# Patient Record
Sex: Male | Born: 1945 | Race: White | Hispanic: No | Marital: Married | Smoking: Former smoker
Health system: Southern US, Community
[De-identification: ages and names within clinical notes are randomized; demographics above are authoritative.]

## PROBLEM LIST (undated history)

## (undated) DIAGNOSIS — F32A Depression, unspecified: Secondary | ICD-10-CM

## (undated) DIAGNOSIS — K219 Gastro-esophageal reflux disease without esophagitis: Secondary | ICD-10-CM

## (undated) DIAGNOSIS — E118 Type 2 diabetes mellitus with unspecified complications: Secondary | ICD-10-CM

## (undated) DIAGNOSIS — I214 Non-ST elevation (NSTEMI) myocardial infarction: Secondary | ICD-10-CM

## (undated) DIAGNOSIS — C801 Malignant (primary) neoplasm, unspecified: Secondary | ICD-10-CM

## (undated) DIAGNOSIS — F329 Major depressive disorder, single episode, unspecified: Secondary | ICD-10-CM

## (undated) DIAGNOSIS — E785 Hyperlipidemia, unspecified: Secondary | ICD-10-CM

## (undated) DIAGNOSIS — L02415 Cutaneous abscess of right lower limb: Secondary | ICD-10-CM

## (undated) DIAGNOSIS — I2510 Atherosclerotic heart disease of native coronary artery without angina pectoris: Secondary | ICD-10-CM

## (undated) DIAGNOSIS — M1990 Unspecified osteoarthritis, unspecified site: Secondary | ICD-10-CM

## (undated) DIAGNOSIS — E119 Type 2 diabetes mellitus without complications: Secondary | ICD-10-CM

## (undated) DIAGNOSIS — K4090 Unilateral inguinal hernia, without obstruction or gangrene, not specified as recurrent: Secondary | ICD-10-CM

## (undated) DIAGNOSIS — I9789 Other postprocedural complications and disorders of the circulatory system, not elsewhere classified: Secondary | ICD-10-CM

## (undated) DIAGNOSIS — E039 Hypothyroidism, unspecified: Secondary | ICD-10-CM

## (undated) DIAGNOSIS — M542 Cervicalgia: Secondary | ICD-10-CM

## (undated) DIAGNOSIS — Z87442 Personal history of urinary calculi: Secondary | ICD-10-CM

## (undated) DIAGNOSIS — Z872 Personal history of diseases of the skin and subcutaneous tissue: Secondary | ICD-10-CM

## (undated) DIAGNOSIS — Z9889 Other specified postprocedural states: Secondary | ICD-10-CM

## (undated) DIAGNOSIS — K8010 Calculus of gallbladder with chronic cholecystitis without obstruction: Secondary | ICD-10-CM

## (undated) DIAGNOSIS — E079 Disorder of thyroid, unspecified: Secondary | ICD-10-CM

## (undated) DIAGNOSIS — I4891 Unspecified atrial fibrillation: Secondary | ICD-10-CM

## (undated) DIAGNOSIS — J45909 Unspecified asthma, uncomplicated: Secondary | ICD-10-CM

## (undated) HISTORY — DX: Hyperlipidemia, unspecified: E78.5

## (undated) HISTORY — DX: Calculus of gallbladder with chronic cholecystitis without obstruction: K80.10

## (undated) HISTORY — DX: Unspecified osteoarthritis, unspecified site: M19.90

## (undated) HISTORY — DX: Cutaneous abscess of right lower limb: L02.415

## (undated) HISTORY — DX: Cervicalgia: M54.2

## (undated) HISTORY — DX: Malignant (primary) neoplasm, unspecified: C80.1

## (undated) HISTORY — DX: Unilateral inguinal hernia, without obstruction or gangrene, not specified as recurrent: K40.90

## (undated) HISTORY — PX: EYE SURGERY: SHX253

## (undated) HISTORY — DX: Other specified postprocedural states: Z98.890

## (undated) HISTORY — PX: SINUS SURGERY WITH INSTATRAK: SHX5215

## (undated) HISTORY — PX: CHOLECYSTECTOMY: SHX55

## (undated) HISTORY — DX: Atherosclerotic heart disease of native coronary artery without angina pectoris: I25.10

## (undated) HISTORY — DX: Disorder of thyroid, unspecified: E07.9

## (undated) HISTORY — PX: HERNIA REPAIR: SHX51

## (undated) HISTORY — DX: Gastro-esophageal reflux disease without esophagitis: K21.9

## (undated) HISTORY — DX: Personal history of diseases of the skin and subcutaneous tissue: Z87.2

## (undated) HISTORY — DX: Non-ST elevation (NSTEMI) myocardial infarction: I21.4

## (undated) HISTORY — DX: Unspecified atrial fibrillation: I48.91

## (undated) HISTORY — DX: Type 2 diabetes mellitus without complications: E11.9

## (undated) HISTORY — DX: Other postprocedural complications and disorders of the circulatory system, not elsewhere classified: I97.89

## (undated) HISTORY — DX: Unspecified asthma, uncomplicated: J45.909

## (undated) HISTORY — DX: Type 2 diabetes mellitus with unspecified complications: E11.8

---

## 2001-12-08 ENCOUNTER — Encounter: Payer: Self-pay | Admitting: Internal Medicine

## 2001-12-08 ENCOUNTER — Ambulatory Visit (HOSPITAL_COMMUNITY): Admission: RE | Admit: 2001-12-08 | Discharge: 2001-12-08 | Payer: Self-pay | Admitting: Internal Medicine

## 2004-03-20 ENCOUNTER — Encounter: Admission: RE | Admit: 2004-03-20 | Discharge: 2004-03-20 | Payer: Self-pay | Admitting: Otolaryngology

## 2004-10-19 ENCOUNTER — Ambulatory Visit: Admission: RE | Admit: 2004-10-19 | Discharge: 2005-01-07 | Payer: Self-pay | Admitting: Radiation Oncology

## 2004-11-03 ENCOUNTER — Encounter: Admission: RE | Admit: 2004-11-03 | Discharge: 2004-11-03 | Payer: Self-pay | Admitting: Urology

## 2004-11-24 HISTORY — PX: PROSTATE SURGERY: SHX751

## 2004-12-09 ENCOUNTER — Ambulatory Visit (HOSPITAL_COMMUNITY): Admission: RE | Admit: 2004-12-09 | Discharge: 2004-12-09 | Payer: Self-pay | Admitting: Urology

## 2004-12-09 ENCOUNTER — Ambulatory Visit (HOSPITAL_BASED_OUTPATIENT_CLINIC_OR_DEPARTMENT_OTHER): Admission: RE | Admit: 2004-12-09 | Discharge: 2004-12-09 | Payer: Self-pay | Admitting: Urology

## 2005-09-24 HISTORY — PX: BACK SURGERY: SHX140

## 2005-10-12 ENCOUNTER — Encounter: Payer: Self-pay | Admitting: Neurosurgery

## 2005-10-13 ENCOUNTER — Inpatient Hospital Stay (HOSPITAL_COMMUNITY): Admission: RE | Admit: 2005-10-13 | Discharge: 2005-10-15 | Payer: Self-pay | Admitting: Neurosurgery

## 2006-03-25 ENCOUNTER — Encounter: Admission: RE | Admit: 2006-03-25 | Discharge: 2006-03-25 | Payer: Self-pay | Admitting: Neurosurgery

## 2006-09-21 ENCOUNTER — Emergency Department (HOSPITAL_COMMUNITY): Admission: EM | Admit: 2006-09-21 | Discharge: 2006-09-21 | Payer: Self-pay | Admitting: *Deleted

## 2008-03-12 ENCOUNTER — Ambulatory Visit: Payer: Self-pay | Admitting: Internal Medicine

## 2008-03-27 ENCOUNTER — Ambulatory Visit: Payer: Self-pay | Admitting: Internal Medicine

## 2008-03-27 ENCOUNTER — Encounter: Payer: Self-pay | Admitting: Internal Medicine

## 2008-03-28 ENCOUNTER — Encounter: Payer: Self-pay | Admitting: Internal Medicine

## 2009-03-12 DIAGNOSIS — Z9889 Other specified postprocedural states: Secondary | ICD-10-CM

## 2009-03-12 HISTORY — DX: Other specified postprocedural states: Z98.890

## 2010-09-11 NOTE — H&P (Signed)
NAME:  Dan Lester, Dan Lester NO.:  [id number]   MEDICAL RECORD NO.:  [id number]          PATIENT TYPE:  INP   LOCATION:  3020                         FACILITY:  [facility]   PHYSICIAN:  Hewitt Shorts, M.D.DATE OF BIRTH:  Feb 19, 1946   DATE OF ADMISSION:  10/13/2005  DATE OF DISCHARGE:                                HISTORY & PHYSICAL   HISTORY OF PRESENT ILLNESS:  The patient is a 65 year old, right handed,  white male who is evaluated for low back and bilateral lumbar radicular  pain.  He has been having low back pain and stiffness for about 2.5 years  and has been undergoing chiropractic treatment.  For the past 2 years he has  noted a pulling sensation in the right lower extremity and more recently  paresthesias to the right lower extremity which have been gradually  worsening and now he is developing paresthesias in the sole of his left  foot.  The symptoms worsened about 2 months ago with the development of pain in the  right buttocks extending down the right posterior thigh and calf with  essential weakness in the right lower extremity.  The patient was evaluated  with x-rays and MRI scan.  The x-rays show a grade 2 spondylolisthesis at L5-  S1 with marked disk space narrowing and vacuum disk phenomenon with  bilateral L5 partial interarticularis defects.  MRI scan shows moderate  multifactorial spinal stenosis at L4-L5 of the spinal canal and a grade 2  spondylolisthesis at L5-S1 secondary to bilateral L5 partial  interarticularis defect with significant disk protrusion worse to the right  with significant bilateral pseudoarthrosis.  There is severe right L5-S1  neuroforaminal stenosis and moderate L5-S1 neuroforaminal stenosis.  The  patient is being admitted now for inferior L4 laminectomy, and L5 Gill  procedure, and L5-S1 posterior lumbar interbody fusion, and L4-S1  posterolateral arthrodesis with posterior instrumentation and right iliac  crest bone  graft.   PAST MEDICAL HISTORY:  1.  History of prostate cancer, status post implantation of radiation seeds.  2.  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  3.  Status post removal of a benign tumor at age 28 from behind his right      eye with residual diplopia.   He does not describe any history of hypertension, myocardial infarctions,  stroke, diabetes, peptic ulcer disease, or lung disease.   PREVIOUS SURGERY:  1.  Removal of tumor from behind his right eye in 1959.  2.  Sinus polyp removal in November 2005.  3.  Insertion of radiation seeds in the prostate in August 2006.   He denies allergies to medications.   CURRENT MEDICATIONS:  1.  Nexium 40 mg every day.  2.  Mobic 15 mg every day.  3.  Wellbutrin 150 mg every day.  4.  Synthroid 0.2 mg every day.  5.  Darvocet p.r.n. pain.  6.  Multivitamin.   FAMILY HISTORY:  His mother passed on at age 66.  She had hypertension.  His  father is in fair health at  age 40.  He had a stroke.  A son has diabetes.   SOCIAL HISTORY:  The patient is married.  He does sales work.  He does not  smoke.  He does drink alcoholic beverages socially and he denies a history  of substance abuse.   REVIEW OF SYSTEMS:  Notable for that described in his history of present  illness, past medical history, but is otherwise unremarkable.   PHYSICAL EXAMINATION:  GENERAL: The patient is a well-developed, well-  nourished, white male in no acute distress.  VITAL SIGNS:  Temperature 97.2, pulse 85, blood pressure 151/94, respiratory  rate 16, height 5 foot 11 inches, weight 180 pounds.  LUNGS:  Clear to auscultation.  He has symmetrical respiratory excursion.  HEART:  Regular rate and rhythm.  S1 S2.  There is no murmur.  ABDOMEN:  Soft, nondistended.  Bowel sounds are present.  EXTREMITIES:  Show no clubbing, cyanosis, or edema.  MUSCULOSKELETAL;  Shows limitation of flexion and extension due to pain.   He  is able to flex to about 45 degrees but has pain at that point.  He is  unable to extend much beyond an upright position because of pain.  Straight  leg raising is positive on the right, negative on the left.  He has mild  discomfort diffusely to palpation in the lumbar region.  NEUROLOGIC:  Shows 5/5 strength in the distal lower extremities including  the dorsiflexors, extensor hallucis longus, and plantar flexor bilaterally.  Sensation is intact to pinprick throughout the distal lower extremities.  Reflexes are 1-2 at the quadriceps and gastrocnemious.  Toes are downgoing  bilaterally.  He has a normal gait and stance.   IMPRESSION:  1.  Patient with low back pain and bilateral lumbar radiculopathy, right      worse than left.  2.  He has moderate stenosis at L4-L5, bilateral L5 partial interarticularis      defect, with a grade 2 spondylolisthesis at L5-S1, with severe right L5-      S1 neuroforaminal stenosis, and moderate left L5-S1 neuroforaminal      stenosis.   PLAN:  The patient will be admitted for inferior L4 laminectomy and L5 Gill  procedure and L5-S1 posterior lumbar interbody fusion with interbody  implants and bone graft and an L4-S1 posterolateral arthrodesis with  posterior instrumentation and right iliac crest bone graft.   We discussed alternatives to surgery including symptomatic treatment as well  as a series of epidural steroid injections.  We have also discussed the  nature of the surgical procedures including typical length of surgery,  hospital stay, and overall recuperation .  His limitations postoperatively.  The need for postoperative immobilization, a lumbar corset, and risks  including the risks of infection, bleeding, possible need for transfusion,  the risk of nerve dysfunction with pain, weakness, numbness, or  paresthesias, the risk of dural tear and possible need for further surgery, the risk of failure of the arthrodesis and anesthetic risk of  myocardial  infarction, stroke, pneumonia and death.  Understanding all of that, he  wishes to proceed with surgery and is admitted for such.      Hewitt Shorts, M.D.  Electronically Signed     RWN/MEDQ  D:  10/13/2005  T:  10/13/2005  Job:  [id number]

## 2010-09-11 NOTE — Op Note (Signed)
NAME:  Dan Lester, Dan Lester NO.:  [id number]   MEDICAL RECORD NO.:  [id number]          PATIENT TYPE:  AMB   LOCATION:  [facility]                         FACILITY:  [facility]   PHYSICIAN:  Valetta Fuller, M.D.  DATE OF BIRTH:  1945/05/27   DATE OF PROCEDURE:  12/09/2004  DATE OF DISCHARGE:                                 OPERATIVE REPORT   PREOPERATIVE DIAGNOSES:  Clinical stage T1C adenocarcinoma of the prostate.   POSTOPERATIVE DIAGNOSES:  Clinical stage T1C adenocarcinoma of the prostate.   PROCEDURE:  I125 Nucletron seed implantation and flexible cystoscopy.   SURGEON:  Valetta Fuller, M.D.   ASSISTANT:  Maryln Gottron, M.D.   ANESTHESIA:  General.   INDICATIONS FOR PROCEDURE:  Mr. Wentzell is a 65 year old male. He was  recently diagnosed with clinical stage T1C adenocarcinoma of the prostate.  The patient was originally seen by Dr. Patsi Sears because of a rise in his  PSA from 1.4 to 3.8. 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 Dr. Patsi Sears recommended repeat biopsy and he was out with  illness and therefore I did that recently. The patient's prostate size is  approximately 28 cubic centimeters. On his biopsy, the patient did have  adenocarcinoma of the prostate. This involved only the left side and only  about 5% of the biopsy material was involved with a Gleason 3+3=6 cancer.  The patient really had no voiding complaints. A PSA around the time of his  diagnosis was only 1.1. We felt therefore he had very low volume well  differentiated prostate cancer. We had suggested the possibility of the  observation and felt that radical retropubic prostatectomy would be over  aggressive treatment despite his young age because of what may be clinically  insignificant prostate cancer. The patient however was nervous about simply  observing this and we felt therefore a reasonable alternative may be seed  implantation for this patient. The  patient subsequently saw Dr. Dayton Scrape in  consultation and requested that seed implantation be done. We felt that  given his situation, he was an excellent candidate for that and he appeared  to understand the advantages and disadvantages of this approach. Again full  and informed consent was obtained.   TECHNIQUE AND FINDINGS:  The patient was brought to the operating room where  he had successful induction of general anesthesia. He was placed in the  moderate lithotomy position, prepped and draped in the usual manner. The  prostate ultrasound was then brought in and anchored to the stand. We spent  the initial 30-45 minutes making sure that prostate positioning was  excellent and that we had a good image. Once that was accomplished, real-  time contouring of the prostate was performed as well as of the urethra and  rectum. Anchor needles were also placed in the prostate and a Foley catheter  had been inserted with some contrast in the balloon. Once all the contouring  was done, Dr. Dayton Scrape and the oncology department established a real-time  plan for the patient. The total number of seeds was 50 with an activity  of  0.432 millicuries per seed. Once the plan had been established, the template  was attached to the stand and onto the perineum. Each needle passage was  done with sagittal ultrasound real-time guidance. Once needle positioning  was confirmed, the needle was attached to the Nucletron automatic dispensing  device and the seeds were implanted. This was done without any difficulty. A  total of 18 needles was used with again 50 seeds. The distribution appeared  excellent and we had no difficulties. At the completion of the procedure,  the Foley catheter was removed and cystoscopy was performed. This showed no  evidence of seeds within the prostatic urethra or bladder. A new Foley  catheter was placed and the patient was brought to the recovery room in  stable condition.            ______________________________  Valetta Fuller, M.D.     DSG/MEDQ  D:  12/10/2004  T:  12/10/2004  Job:  [id number]

## 2010-09-11 NOTE — Op Note (Signed)
NAME:  Dan Lester, Dan Lester NO.:  [id number]   MEDICAL RECORD NO.:  [id number]          PATIENT TYPE:  INP   LOCATION:  3020                         FACILITY:  [facility]   PHYSICIAN:  Hewitt Shorts, M.D.DATE OF BIRTH:  [birth year]   DATE OF PROCEDURE:  10/13/2005  DATE OF DISCHARGE:                                 OPERATIVE REPORT   PREOPERATIVE DIAGNOSIS:  Bilateral L5 pars intra-articularis defect, grade 2  spondylolisthesis of L5 and S1, bilateral L5-S1 neuroforaminal stenosis, L4-  5 canal stenosis, lumbar spondylosis and lumbar degenerative disk disease.   POSTOPERATIVE DIAGNOSIS:  Grade 2 spondylolisthesis at L5 and S1 secondary  to facet arthropathy, bilateral L5-S1 neuroforaminal stenosis, lumbar canal  stenosis at L4-5, and lumbar spondylosis and lumbar degenerative disk  disease.   PROCEDURE:  Inferior L4 laminectomy and complete L5 laminectomy and superior  S1 laminectomy with microdissection, L5-S1 posterior lumbar interbody fusion  with AVS Peak interbody implants and right iliac crest bone graft and VITOSS  with bone marrow aspirate and L4 to S1 posterolateral arthrodesis with 90D  posterior instrumentation and right iliac crest bone graft and VITOSS with  bone marrow aspirate.   SURGEON:  Hewitt Shorts, M.D.   ASSISTANT:  Danae Orleans. Venetia Maxon, M.D.   ANESTHESIA:  General endotracheal.   INDICATIONS FOR PROCEDURE:  The patient is a 65 year old man who presented  with low back and bilateral lumbar radicular pain right worse than left.  MRI revealed advanced degenerative changes in the lower lumbar spine and a  decision was made to proceed with decompression and stabilization.   DESCRIPTION OF PROCEDURE:  The patient was brought to the operating room and  placed under general endotracheal anesthesia.  The patient was turned to a  prone position and the lumbar region was prepped with Betadine soap and  solution and draped in a sterile fashion.  The  midline was infiltrated with  local anesthetic with epinephrine and an x-ray was taken to localize the L4-  5 and L5-S1 levels and then a midline incision was made and carried down  through the subcutaneous tissue.  Bipolar cautery and electrocautery were  used to maintain hemostasis.  Dissection was carried down to the lumbar  fascia which was incised bilaterally and the paraspinal muscles were  dissected from the spinous process of the lamina in a subperiosteal fashion.  Another x-ray was taken and the L4-5 and L5-S1 intralaminar spaces were  identified.  There is marked spondylosis noted with significant facet  hypertrophy and arthropathy bilaterally at both L4-5 and L5-S1.  Laminectomy  was initiated using the X-Max drill and double action rongeurs and Kerrison  punches.  The dura was found to be adherent to the lamina and was carefully  dissected from it.  However, a small rent in the dura did occur on the right  side beneath the lamina of L5.  The microscope was draped and brought into  the field to provide instant magnification, illumination, and visualization.  The remainder of the decompression was performed using microdissection and  microsurgical technique.  The small rent in the dura was repaired with  a  running 6-0 Prolene suture and a good water tight closure was achieved.  The  decompression was extended laterally removing the hypertrophic facet  overgrowth bilaterally at the L4-5 and L5-S1.  The ligamentum flavum was  markedly thickened at L4-5 and was carefully removed decompressing the  thecal sac.  We identified the exiting L5 nerve roots bilaterally and  dissection was carried out into the L5-S1 neuroforamen bilaterally and there  was significant spondylitic overgrowth and pseudoarthrosis from the  arthropathic facet joints compressing the nerve roots and this was carefully  removed decompressing the nerve roots.  We then coagulated the epidural  veins and identified the  annulus at the L5-S1 disk.  There is significant  spondylitic degeneration and the annulus was incised and we entered into the  disk space.  The posterior superior aspect of S1 was removed to provide  better exposure into the disk space.  Then we entered the disk space and  using a variety of microcurettes and pituitary rongeurs, a thorough  diskectomy was performed.  We then used a variety of paddle curets to  prepare the endplates at L5 and S1 for an interbody arthrodesis.  Once the  endplates were cleaned of cartilaginous tissue down to the bony tissue, we  then measured the height of the intervertebral disk space and selected 9 mm  x 20 mm interbody implants with 8 degrees of lordosis due to the relative  fishmouth appearance of the L5-S1 disk space.  We then brought the fascial  layers together and identified the right superior iliac crest.  The  overlying skin and subcutaneous tissue were infiltrated with local  anesthetic with epinephrine and then an incision was made in the skin  overlying the right iliac crest and dissection was carried down to the  subcutaneous tissue to the right iliac crest.  The fascial insertion on the  superior aspect was incised and the muscle dissected from the posterior  aspect of the iliac crest.  A Taylor retractor was placed and then using a  variety of osteotomes and curets, strips of corticocancellous bone and then  subsequently strips of cancellous bone were harvested and set aside for  later use in the arthrodesis.  Once the harvesting of bone graft was  completed, we soaked a 10 mL strip of VITOSS in the bone marrow that oozed  from the edges of the harvesting site and then we proceeded with closure.  The deep fascia was closed with interrupted undyed 1 Vicryl sutures.  Scarpa's fascia was closed with interrupted undyed 1 Vicryl sutures.  The subcutaneous tissue and subcuticular layer were closed with interrupted  inverted 2-0 Vicryl sutures and  the skin edges were closed with surgical  staples.  A count was done at that time and was correct.  We then placed the  self-retaining retractor back in the lumbar incision.  We packed the Peak  interbody implants with the cancellous excess bone graft, and then carefully  retracting the thecal sac first on the right side and then subsequently on  the left side, the interbody implants were placed and countersunk.  Prior to  placing the second implant, that is the one on the left side, we packed the  space between the two Peak implants with VITOSS soaked with bone marrow  aspirate.  We then proceeded with the posterolateral arthrodesis.  We had  previously exposed the transverse processes of L4 and L5 and the ala of S1.  These were decorticated using the  X-MAX drill.  The C-arm fluoroscope was  then brought into the field and using C-arm fluoroscopy, we identified the  pedicle entry sites for L4, L5, and S1 bilaterally.  Using a pedicle probe,  each of the pedicles was probed, examined with a ball probe, no cutouts were  found.  We then tapped each of the pedicles with a 5.25 mm tap and we placed  5.75 x 40 mm screws at L4, 5.75 x 35 mm screws at L5, and we placed a 5.75 x  30 mm screw on the left side at S1, and a 6.75 x 30 mm screw on the right  side at S1 due to some concern regarding the initial threading.  Good  purchase was achieved with that right S1 screw.  Once all six screws were in  place, an AP view confirmed good trajectories.  We then selected a 50 mm  lordosis rod.  Prior to placing the rods, we packed the lateral gutters over  the transverse processes and ala with a combination of cancellous autograft,  VITOSS with bone marrow aspirate, and then strips of corticocancellous  autograft.  Once the bone graft was packed in the lateral gutters, we placed  the lordosed 50 mm rods bilaterally.  They were secured with locking caps  and final tightening was done of all six locking caps.   The wound was  irrigated numerous times during the procedure with bacitracin solution.  Hemostasis was established and confirmed.  The dual repair was intact and  showed no signs of CSF leakage.  It was felt that a good water tight closure  had been achieved.  The wound was then closed in multiple layers.  The  paraspinal muscles were approximated with interrupted undyed 1 Vicryl  sutures, the deep fascia was closed with interrupted undyed 1 Vicryl  sutures, Scarpa's fascia was closed with interrupted undyed inverted 1  Vicryl sutures, and the subcutaneous and subcuticular layer were closed with  interrupted inverted 2-0 undyed Vicryl sutures, and skin edges were closed  with surgical staples.  The wound was dressed with Adaptic, sterile gauze,  and Hypafix.  The procedure was tolerated well.  The estimated blood loss  was 600 mL.  We did give the patient back 300 mL of Cellsaver blood.  Needle, sponge, and instrument  counts correct.  Following surgery, the patient was turned back to supine  position to be reversed from anesthetic, extubated, and transferred to the  recovery room for further care where he was noted to be moving all four  extremities to command.      Hewitt Shorts, M.D.  Electronically Signed     RWN/MEDQ  D:  10/13/2005  T:  10/14/2005  Job:  [id number]

## 2011-12-08 ENCOUNTER — Encounter (INDEPENDENT_AMBULATORY_CARE_PROVIDER_SITE_OTHER): Payer: Self-pay | Admitting: Surgery

## 2011-12-17 ENCOUNTER — Encounter (INDEPENDENT_AMBULATORY_CARE_PROVIDER_SITE_OTHER): Payer: Self-pay | Admitting: Surgery

## 2011-12-17 ENCOUNTER — Ambulatory Visit (INDEPENDENT_AMBULATORY_CARE_PROVIDER_SITE_OTHER): Payer: BC Managed Care – PPO | Admitting: Surgery

## 2011-12-17 VITALS — BP 138/84 | HR 72 | Temp 97.4°F | Resp 14 | Ht 74.0 in | Wt 184.0 lb

## 2011-12-17 DIAGNOSIS — K4090 Unilateral inguinal hernia, without obstruction or gangrene, not specified as recurrent: Secondary | ICD-10-CM

## 2011-12-17 HISTORY — DX: Unilateral inguinal hernia, without obstruction or gangrene, not specified as recurrent: K40.90

## 2011-12-17 NOTE — Progress Notes (Signed)
Patient ID: Dan Lester, male   DOB: 08/14/1945, 66 y.o.   MRN: [id number]  Chief Complaint  Patient presents with  . Inguinal Hernia    HPI Dan Lester is a 66 y.o. male.  Referred by Dr. Jacky Kindle for evaluation of left inguinal hernia HPI    Two month history of an enlarging bulge in his left groin.  It has become larger and more uncomfortable, but remains reducible.  He was evaluated by Dr. Jacky Kindle who felt that he had a left inguinal hernia.  He denies any obstructive symptoms.  Past Medical History  Diagnosis Date  . GERD (gastroesophageal reflux disease)   . Hyperlipidemia   . Neck pain   . H/O colonoscopy 03/12/2009  . Asthma   . Inguinal hernia     Past Surgical History  Procedure Date  . Eye surgery   . Sinus surgery with instatrak   . Prostate surgery 11/2004    seed implant  . Back surgery 09/2005    History reviewed. No pertinent family history.  Social History History  Substance Use Topics  . Smoking status: Passive Smoker    Types: Cigars  . Smokeless tobacco: Never Used  . Alcohol Use: No    No Known Allergies  Current Outpatient Prescriptions  Medication Sig Dispense Refill  . buPROPion (WELLBUTRIN SR) 150 MG 12 hr tablet Take 150 mg by mouth daily.      Marland Kitchen esomeprazole (NEXIUM) 40 MG capsule Take 40 mg by mouth daily before breakfast.      . levothyroxine (SYNTHROID) 200 MCG tablet Take 200 mcg by mouth daily.      . Fluticasone-Salmeterol (ADVAIR) 250-50 MCG/DOSE AEPB Inhale 1 puff into the lungs every 12 (twelve) hours.      . methylPREDNISolone acetate (DEPO-MEDROL) 40 MG/ML injection once.      Marland Kitchen PREDNISONE, PAK, PO Take by mouth.        Review of Systems Review of Systems  Constitutional: Negative for fever, chills and unexpected weight change.  HENT: Negative for hearing loss, congestion, sore throat, trouble swallowing and voice change.   Eyes: Negative for visual disturbance.  Respiratory: Negative for cough and  wheezing.   Cardiovascular: Negative for chest pain, palpitations and leg swelling.  Gastrointestinal: Negative for nausea, vomiting, abdominal pain, diarrhea, constipation, blood in stool, abdominal distention, anal bleeding and rectal pain.  Genitourinary: Negative for hematuria and difficulty urinating.  Musculoskeletal: Positive for back pain and arthralgias.  Skin: Negative for rash and wound.  Neurological: Negative for seizures, syncope, weakness and headaches.  Hematological: Negative for adenopathy. Does not bruise/bleed easily.  Psychiatric/Behavioral: Negative for confusion.    Blood pressure 138/84, pulse 72, temperature 97.4 F (36.3 C), temperature source Temporal, resp. rate 14, height 6\' 2"  (1.88 m), weight 184 lb (83.462 kg).  Physical Exam Physical Exam WDWN in NAD HEENT:  EOMI, sclera anicteric Neck:  No masses, no thyromegaly Lungs:  CTA bilaterally; normal respiratory effort CV:  Regular rate and rhythm; no murmurs Abd:  +bowel sounds, soft, non-tender, no masses GU:  Bilateral descended testicles; visible left inguinal bulge - reducible; no sign of right inguinal hernia Ext:  Well-perfused; no edema Skin:  Warm, dry; no sign of jaundice  Data Reviewed none  Assessment    Left inguinal hernia - reducible     Plan    Left inguinal hernia repair with mesh.  The surgical procedure has been discussed with the patient.  Potential risks, benefits, alternative treatments, and  expected outcomes have been explained.  All of the patient's questions at this time have been answered.  The likelihood of reaching the patient's treatment goal is good.  The patient understand the proposed surgical procedure and wishes to proceed.        Manveer Gomes K. 12/17/2011, 10:36 AM

## 2012-02-04 DIAGNOSIS — K4090 Unilateral inguinal hernia, without obstruction or gangrene, not specified as recurrent: Secondary | ICD-10-CM

## 2012-02-23 ENCOUNTER — Ambulatory Visit (INDEPENDENT_AMBULATORY_CARE_PROVIDER_SITE_OTHER): Payer: BC Managed Care – PPO | Admitting: Surgery

## 2012-02-23 ENCOUNTER — Encounter (INDEPENDENT_AMBULATORY_CARE_PROVIDER_SITE_OTHER): Payer: Self-pay | Admitting: Surgery

## 2012-02-23 VITALS — BP 122/88 | HR 81 | Temp 98.2°F | Resp 14 | Ht 74.0 in | Wt 184.4 lb

## 2012-02-23 DIAGNOSIS — K4090 Unilateral inguinal hernia, without obstruction or gangrene, not specified as recurrent: Secondary | ICD-10-CM

## 2012-02-23 NOTE — Progress Notes (Signed)
Followup after a left inguinal hernia repair with mesh on 02/04/12. He had a indirect hernia. He has minimal swelling. He still has some sensitivity near the pubic tubercle radiating down towards his testicle. On examination his incision is well-healed no sign of infection. Minimal swelling. No sign of recurrent hernia. He is tender in his pubic tubercle. Explained that this should resolve as the inflammation resolves. I recommended using ibuprofen. He may follow up with [place] as needed. He should slowly increase his level of activity over next 2-3 weeks.  Wilmon Arms. Corliss Skains, MD, [facility]  02/23/2012 9:49 AM

## 2013-02-08 ENCOUNTER — Encounter: Payer: Self-pay | Admitting: Internal Medicine

## 2013-09-22 ENCOUNTER — Encounter: Payer: Self-pay | Admitting: Internal Medicine

## 2013-09-24 ENCOUNTER — Ambulatory Visit
Admission: RE | Admit: 2013-09-24 | Discharge: 2013-09-24 | Disposition: A | Discharge status: Home / Self Care | Payer: BC Managed Care – PPO | Source: Ambulatory Visit | Attending: Internal Medicine | Admitting: Internal Medicine

## 2013-09-24 ENCOUNTER — Other Ambulatory Visit: Payer: Self-pay | Admitting: Internal Medicine

## 2013-09-24 DIAGNOSIS — R1013 Epigastric pain: Secondary | ICD-10-CM

## 2013-09-24 DIAGNOSIS — R197 Diarrhea, unspecified: Secondary | ICD-10-CM

## 2013-09-24 DIAGNOSIS — R112 Nausea with vomiting, unspecified: Secondary | ICD-10-CM

## 2013-09-25 ENCOUNTER — Other Ambulatory Visit (HOSPITAL_COMMUNITY): Payer: Self-pay | Admitting: Internal Medicine

## 2013-09-25 DIAGNOSIS — K828 Other specified diseases of gallbladder: Secondary | ICD-10-CM

## 2013-09-25 DIAGNOSIS — R109 Unspecified abdominal pain: Secondary | ICD-10-CM

## 2013-10-03 ENCOUNTER — Ambulatory Visit (HOSPITAL_COMMUNITY)
Admission: RE | Admit: 2013-10-03 | Discharge: 2013-10-03 | Disposition: A | Discharge status: Home / Self Care | Payer: BC Managed Care – PPO | Source: Ambulatory Visit | Attending: Internal Medicine | Admitting: Internal Medicine

## 2013-10-03 DIAGNOSIS — K8020 Calculus of gallbladder without cholecystitis without obstruction: Secondary | ICD-10-CM | POA: Insufficient documentation

## 2013-10-03 DIAGNOSIS — K828 Other specified diseases of gallbladder: Secondary | ICD-10-CM

## 2013-10-03 DIAGNOSIS — R109 Unspecified abdominal pain: Secondary | ICD-10-CM | POA: Insufficient documentation

## 2013-10-03 MED ORDER — TECHNETIUM TC 99M MEBROFENIN IV KIT
5.1000 | PACK | Freq: Once | INTRAVENOUS | Status: AC | PRN
  Administered 2013-10-03: 5 via INTRAVENOUS

## 2013-10-04 ENCOUNTER — Encounter: Payer: Self-pay | Admitting: Internal Medicine

## 2013-10-12 ENCOUNTER — Other Ambulatory Visit: Payer: Self-pay | Admitting: Internal Medicine

## 2013-10-12 DIAGNOSIS — R109 Unspecified abdominal pain: Secondary | ICD-10-CM

## 2013-10-15 ENCOUNTER — Ambulatory Visit
Admission: RE | Admit: 2013-10-15 | Discharge: 2013-10-15 | Disposition: A | Discharge status: Home / Self Care | Payer: BC Managed Care – PPO | Source: Ambulatory Visit | Attending: Internal Medicine | Admitting: Internal Medicine

## 2013-10-15 DIAGNOSIS — R109 Unspecified abdominal pain: Secondary | ICD-10-CM

## 2013-10-15 MED ORDER — IOHEXOL 300 MG/ML  SOLN
100.0000 mL | Freq: Once | INTRAMUSCULAR | Status: AC | PRN
  Administered 2013-10-15: 100 mL via INTRAVENOUS

## 2013-10-16 ENCOUNTER — Encounter (INDEPENDENT_AMBULATORY_CARE_PROVIDER_SITE_OTHER): Payer: Self-pay | Admitting: Surgery

## 2013-10-19 ENCOUNTER — Encounter (INDEPENDENT_AMBULATORY_CARE_PROVIDER_SITE_OTHER): Payer: Self-pay | Admitting: Surgery

## 2013-10-19 ENCOUNTER — Ambulatory Visit (INDEPENDENT_AMBULATORY_CARE_PROVIDER_SITE_OTHER): Payer: BC Managed Care – PPO | Admitting: Surgery

## 2013-10-19 VITALS — BP 152/98 | HR 68 | Temp 97.4°F | Resp 16 | Ht 74.0 in | Wt 182.0 lb

## 2013-10-19 DIAGNOSIS — K8010 Calculus of gallbladder with chronic cholecystitis without obstruction: Secondary | ICD-10-CM

## 2013-10-19 HISTORY — DX: Calculus of gallbladder with chronic cholecystitis without obstruction: K80.10

## 2013-10-19 NOTE — Progress Notes (Signed)
Patient ID: Dan Lester, male   DOB: 28-Jan-1946, 68 y.o.   MRN: [id number]  Chief Complaint  Patient presents with  . Cholelithiasis    new pt    HPI Dan Lester is a 68 y.o. male.  Referred by Dr. Burnard Bunting for evaluation of gallbladder disease  HPI This is a healthy 68 year old male who presents with a one-month history of intermittent abdominal pain, abdominal bloating, nausea, occasional vomiting, diarrhea, that seems to occur sporadically in the morning. There is slight correlation with eating breakfast. The pain has recently begun radiating around his right side. He has had an extensive radiologic workup that shows gallstones but no sign of choledocholithiasis. No sign of cholecystitis. He presents now for surgical evaluation for cholecystectomy.  Past Medical History  Diagnosis Date  . GERD (gastroesophageal reflux disease)   . Hyperlipidemia   . Neck pain   . H/O colonoscopy 03/12/2009  . Asthma   . Inguinal hernia   . Diabetes mellitus without complication   . Arthritis   . Cancer     prostate  . Thyroid disease     hyperthyroidism; surgery changed him to hypo    Past Surgical History  Procedure Laterality Date  . Sinus surgery with instatrak    . Prostate surgery  11/2004    seed implant  . Back surgery  09/2005  . Hernia repair    . Eye surgery      tumor removed; right    Family History  Problem Relation Age of Onset  . Heart disease Mother   . Cancer Father     colon  . Heart disease Father     Social History History  Substance Use Topics  . Smoking status: Former Smoker    Types: Cigars  . Smokeless tobacco: Never Used  . Alcohol Use: No    No Known Allergies  Current Outpatient Prescriptions  Medication Sig Dispense Refill  . buPROPion (WELLBUTRIN SR) 150 MG 12 hr tablet Take 150 mg by mouth daily.      Marland Kitchen esomeprazole (NEXIUM) 40 MG capsule Take 40 mg by mouth daily before breakfast.      . Fluticasone-Salmeterol  (ADVAIR) 250-50 MCG/DOSE AEPB Inhale 1 puff into the lungs every 12 (twelve) hours.      Marland Kitchen levothyroxine (SYNTHROID) 200 MCG tablet Take 200 mcg by mouth daily.       No current facility-administered medications for this visit.    Review of Systems Review of Systems  Constitutional: Negative for fever, chills and unexpected weight change.  HENT: Negative for congestion, hearing loss, sore throat, trouble swallowing and voice change.   Eyes: Negative for visual disturbance.  Respiratory: Negative for cough and wheezing.   Cardiovascular: Negative for chest pain, palpitations and leg swelling.  Gastrointestinal: Positive for nausea, vomiting, abdominal pain, diarrhea and abdominal distention. Negative for constipation, blood in stool, anal bleeding and rectal pain.  Genitourinary: Negative for hematuria and difficulty urinating.  Musculoskeletal: Negative for arthralgias.  Skin: Negative for rash and wound.  Neurological: Negative for seizures, syncope, weakness and headaches.  Hematological: Negative for adenopathy. Does not bruise/bleed easily.  Psychiatric/Behavioral: Negative for confusion.    Blood pressure 152/98, pulse 68, temperature 97.4 F (36.3 C), temperature source Temporal, resp. rate 16, height 6\' 2"  (1.88 m), weight 182 lb (82.555 kg).  Physical Exam Physical Exam WDWN in NAD HEENT:  EOMI, sclera anicteric Neck:  No masses, no thyromegaly Lungs:  CTA bilaterally; normal respiratory effort  CV:  Regular rate and rhythm; no murmurs Abd:  +bowel sounds, soft, mild RUQ tenderness; no masses Ext:  Well-perfused; no edema Skin:  Warm, dry; no sign of jaundice  Data Reviewed US Abdomen Complete  09/24/2013   CLINICAL DATA:  Epigastric pain with nausea, vomiting and diarrhea.  EXAM: ULTRASOUND ABDOMEN COMPLETE  COMPARISON:  CT abdomen and pelvis 09/21/2006.  FINDINGS: Gallbladder:  Several mobile stones are identified within the gallbladder and there is some gallbladder  sludge. There is no gallbladder wall thickening or pericholecystic fluid. Sonographer reports negative Murphy's sign.  Common bile duct:  Diameter: 0.5 cm.  Liver:  The liver demonstrates increased echogenicity consistent with fatty infiltration. No focal lesion or intrahepatic biliary ductal dilatation is seen. There is normal hepatopetal flow in the portal vein.  IVC:  No abnormality visualized.  Pancreas:  Visualized portion unremarkable.  Spleen:  Size and appearance within normal limits.  Right Kidney:  Length: 11.5 cm. Echogenicity within normal limits. No mass or hydronephrosis visualized.  Left Kidney:  Length: 11.6 cm. Echogenicity within normal limits. No mass or hydronephrosis visualized.  Abdominal aorta:  No aneurysm visualized.  Other findings:  None.  IMPRESSION: Small gallstones with some gallbladder sludge. Negative for acute cholecystitis.  Fatty infiltration of the liver.   Electronically Signed   By: Inge Rise M.D.   On: 09/24/2013 13:55   Ct Abdomen Pelvis W Contrast  10/15/2013   CLINICAL DATA:  abd pain ? mass  Ectatic  EXAM: CT ABDOMEN AND PELVIS WITH CONTRAST  TECHNIQUE: Multidetector CT imaging of the abdomen and pelvis was performed using the standard protocol following bolus administration of intravenous contrast.  CONTRAST:  114mL OMNIPAQUE IOHEXOL 300 MG/ML  SOLN  COMPARISON:  CT abdomen pelvis on 09/21/2006  FINDINGS: Minimal hypoventilation in the lung bases. Atherosclerotic calcifications within the coronary arteries.  The liver, spleen, adrenals, pancreas, kidneys are unremarkable.  No abdominal aortic aneurysm. The celiac, SMA, IMA, portal vein, SMV are opacified.  No abdominal or pelvic masses, free fluid, loculated fluid collections, or adenopathy.  Multilevel spondylosis within the spine. Stable postsurgical changes in the lower lumbar spine. No aggressive appearing osseous lesions.  No abdominal wall hernia. A small fat containing left inguinal hernia.  IMPRESSION:  Postsurgical changes in the lumbar spine.  Small fat containing left inguinal hernia.  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  Moderate amount of fecal retention.No CT evidence accounting for the patient's clinical presentation.   Electronically Signed   By: Margaree Mackintosh M.D.   On: 10/15/2013 16:34   Nm Hepato W/eject Fract  10/03/2013   CLINICAL DATA:  Gallstones and sludge with abdominal pain.  EXAM: NUCLEAR MEDICINE HEPATOBILIARY IMAGING WITH GALLBLADDER EF  TECHNIQUE: Sequential images of the abdomen were obtained out to 60 minutes following intravenous administration of radiopharmaceutical. After oral ingestion of Ensure, gallbladder ejection fraction was determined. At 60 min, normal ejection fraction is greater than 33%.  RADIOPHARMACEUTICALS:  5.1 [employer] UX-32T Choletec  COMPARISON:  Abdominal ultrasound 09/24/2013.  FINDINGS: Gallbladder ejection fraction: 65%. Normal gallbladder ejection fraction with Ensure is greater than 33%. The patient did not experience symptoms after oral ingestion of Ensure.  IMPRESSION: Normal exam.   Electronically Signed   By: Lorin Picket M.D.   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 The surgical procedure has been discussed with the patient.  Potential risks, benefits, alternative treatments, and expected outcomes have been explained.  All of the patient's questions at this time have been answered.  The likelihood of reaching the patient's treatment goal is good.  The patient understand the proposed surgical procedure and wishes to proceed.         Kirrah Mustin K. 10/19/2013, 12:13 PM

## 2013-10-23 ENCOUNTER — Other Ambulatory Visit (INDEPENDENT_AMBULATORY_CARE_PROVIDER_SITE_OTHER): Payer: Self-pay | Admitting: Surgery

## 2013-10-23 ENCOUNTER — Other Ambulatory Visit (INDEPENDENT_AMBULATORY_CARE_PROVIDER_SITE_OTHER): Payer: Self-pay

## 2013-10-23 DIAGNOSIS — K8010 Calculus of gallbladder with chronic cholecystitis without obstruction: Secondary | ICD-10-CM

## 2013-10-23 MED ORDER — OXYCODONE-ACETAMINOPHEN 5-325 MG PO TABS: 1.0000 | ORAL_TABLET | Freq: Four times a day (QID) | ORAL | Status: DC | PRN

## 2013-11-06 ENCOUNTER — Encounter (INDEPENDENT_AMBULATORY_CARE_PROVIDER_SITE_OTHER): Payer: Self-pay | Admitting: Surgery

## 2013-11-06 ENCOUNTER — Ambulatory Visit (INDEPENDENT_AMBULATORY_CARE_PROVIDER_SITE_OTHER): Payer: BC Managed Care – PPO | Admitting: Surgery

## 2013-11-06 VITALS — BP 144/90 | HR 68 | Temp 98.5°F | Resp 16 | Ht 74.0 in | Wt 181.2 lb

## 2013-11-06 DIAGNOSIS — L02415 Cutaneous abscess of right lower limb: Secondary | ICD-10-CM

## 2013-11-06 DIAGNOSIS — L02419 Cutaneous abscess of limb, unspecified: Secondary | ICD-10-CM

## 2013-11-06 DIAGNOSIS — L03119 Cellulitis of unspecified part of limb: Secondary | ICD-10-CM

## 2013-11-06 DIAGNOSIS — K8010 Calculus of gallbladder with chronic cholecystitis without obstruction: Secondary | ICD-10-CM

## 2013-11-06 HISTORY — DX: Cutaneous abscess of right lower limb: L02.415

## 2013-11-06 NOTE — Progress Notes (Signed)
Status post laparoscopic cholecystectomy on 10/23/13 for chronic calculus cholecystitis. The patient is doing reasonably well from the surgery. His incisions are well-healed with no sign of infection. Appetite is improving. He still has a low bit of postprandial diarrhea but this seems to be improving.  His main complaint today is that he has had an abscess forming on the back of his upper right thigh for the last week. Recently this burst and began draining some purulent fluid.  He is currently on a course of doxycycline.  Filed Vitals:   11/06/13 1340  BP: 144/90  Pulse: 68  Temp: 98.5 F (36.9 C)  Resp: 16    The left thigh abscess has a 10 cm area of erythema. There is some central fluctuance with a pinhole draining some purulent fluid. We prepped this with Betadine and anesthetized with 1% lidocaine. I made a 2 cm incision and we entered a 3 cm abscess cavity. I expressed as much pus as possible. Was packed with new gauze. He will remove the gauze in 24 hours. The patient is out of town next week. He will follow up appointment with one of  my partners when he returns from his trip.  Imogene Burn. [name], MD, [facility] Surgery  General/ Trauma Surgery  11/06/2013 2:27 PM

## 2013-11-19 ENCOUNTER — Ambulatory Visit (INDEPENDENT_AMBULATORY_CARE_PROVIDER_SITE_OTHER): Payer: BC Managed Care – PPO | Admitting: Surgery

## 2013-11-19 ENCOUNTER — Encounter (INDEPENDENT_AMBULATORY_CARE_PROVIDER_SITE_OTHER): Payer: Self-pay | Admitting: Surgery

## 2013-11-19 VITALS — BP 123/76 | HR 77 | Temp 98.1°F | Resp 16 | Ht 74.0 in | Wt 180.8 lb

## 2013-11-19 DIAGNOSIS — Z09 Encounter for follow-up examination after completed treatment for conditions other than malignant neoplasm: Secondary | ICD-10-CM

## 2013-11-19 NOTE — Progress Notes (Signed)
Subjective:     Patient ID: Dan Lester, male   DOB: 04/29/1945, 68 y.o.   MRN: [id number]  HPI He is here for followup of the right posterior thigh abscess. He has minimal drainage and has finished his antibiotics over a week ago and is doing well  Review of Systems     Objective:   Physical Exam On exam, there is an open wound which is clean. I treated with silver nitrate. There is no evidence of ongoing infection    Assessment:     Patient stable postop     Plan:     He will continue his current wound care and will see [place] back as needed

## 2013-12-10 ENCOUNTER — Ambulatory Visit (INDEPENDENT_AMBULATORY_CARE_PROVIDER_SITE_OTHER): Payer: BC Managed Care – PPO | Admitting: Surgery

## 2013-12-10 VITALS — BP 158/78 | HR 81 | Temp 97.5°F | Ht 74.0 in | Wt 178.1 lb

## 2013-12-10 DIAGNOSIS — L02415 Cutaneous abscess of right lower limb: Secondary | ICD-10-CM

## 2013-12-10 DIAGNOSIS — L02419 Cutaneous abscess of limb, unspecified: Secondary | ICD-10-CM

## 2013-12-10 DIAGNOSIS — L03119 Cellulitis of unspecified part of limb: Secondary | ICD-10-CM

## 2013-12-10 NOTE — Progress Notes (Signed)
[place], MD,  [address] [address]  [place], [place]    [place] Phone:  [phone] FAX:  [phone]   Re:   Dan Lester DOB:   April 25, 1946 MRN:   [id number]  Urgent Office  ASSESSMENT AND PLAN: 1.  Right posterior thigh/buttocks wound  I did an I&D of the wound  He will see Dr. [name] back in about one week to check the wound.  He will do BID dressing changes.  2.  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  Recent diagnosis of diabetes  HISTORY OF PRESENT ILLNESS: Chief Complaint  Patient presents with  . Recurrent Skin Infections   Dan Lester is a 68 y.o. (DOB: Apr 05, 1946)  white  male who is a patient of ARONSON,RICHARD A, MD and comes to Urgent Office today for persistent draining wound. He had a laparoscopic cholecystectomy at the [facility] by Dr. [name] on 10/23/2013. He then presented on 11/06/2013 to Dr. [name] with an abscess on the back of his upper right thigh.  Dr. [name] did an I&D of this abscess.  He describes a 10 cm area of erythema. The wound seem to be getting better when he saw Dr. Ninfa Linden on 11/19/2013. However, yesterday the area drained a large amount of fluid. It was looked at by his wife Caryl Asp, who is a Marine scientist. And he came to the urgent office today. Complicating the management of this area is that he has numbness from his sciatic nerve injury and involves his right buttocks and right posterior thigh. He's had no fever.  Past Medical History  Diagnosis Date  . GERD (gastroesophageal reflux disease)   . Hyperlipidemia   . Neck pain   . H/O colonoscopy 03/12/2009  . Asthma   . Inguinal hernia   . Diabetes mellitus without complication   . Arthritis   . Cancer     prostate  . Thyroid disease     hyperthyroidism; surgery changed him to hypo    SOCIAL HISTORY: His wife, Caryl Asp, is a Marine scientist. He  works at a Education officer, community. His son, Mitzi Hansen, is my son's age.  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 He has an older son, Catalina Antigua, who is about 41 and lives in [place].  He is a Engineer, maintenance (IT).  PHYSICAL EXAM: BP 158/78  Pulse 81  Temp(Src) 97.5 F (36.4 C) (Temporal)  Ht 6\' 2"  (1.88 m)  Wt 178 lb 2 oz (80.797 kg)  BMI 22.86 kg/m2  Buttocks/posterior right thigh - 1 cm opening leads to a 2 cm cavity.  I think that the opening is closing before cavity heals.  It needs to be opened more widely.  Procedure:  I prepped his skin with chloroprep.  I did an I&D of the wound and packed it with gauze.  Once I opened the skin the tract and cavity were much larger than I expected. I ended up making a 3 cm incision and was able to pack about two thirds of a saline damp gauze in the wound.  DATA REVIEWED: Epic notes  Alphonsa Overall, MD,  [facility] Surgery, [place] [address]  [place], [place]    [place] Phone:  [phone] FAX:  [phone]

## 2013-12-11 ENCOUNTER — Ambulatory Visit: Payer: BC Managed Care – PPO | Admitting: Internal Medicine

## 2013-12-21 ENCOUNTER — Ambulatory Visit (INDEPENDENT_AMBULATORY_CARE_PROVIDER_SITE_OTHER): Payer: BC Managed Care – PPO | Admitting: Surgery

## 2013-12-21 ENCOUNTER — Encounter (INDEPENDENT_AMBULATORY_CARE_PROVIDER_SITE_OTHER): Payer: Self-pay | Admitting: Surgery

## 2013-12-21 VITALS — BP 122/78 | HR 75 | Temp 97.8°F | Ht 74.0 in | Wt 177.0 lb

## 2013-12-21 DIAGNOSIS — L02419 Cutaneous abscess of limb, unspecified: Secondary | ICD-10-CM

## 2013-12-21 DIAGNOSIS — L02415 Cutaneous abscess of right lower limb: Secondary | ICD-10-CM

## 2013-12-21 DIAGNOSIS — L03119 Cellulitis of unspecified part of limb: Secondary | ICD-10-CM

## 2013-12-21 NOTE — Progress Notes (Signed)
Status post repeat incision and drainage of a abscess on his posterior right thigh. He continues to have some drainage although seems to be more clear. On examination the erythema and induration around the wound seemed to be much improved. The wound is filling with granulation tissue with minimal drainage. He should continue with twice a day wet-to-dry dressing changes. Recheck in 3 weeks.  Imogene Burn. [name], MD, [facility] Surgery  General/ Trauma Surgery  12/21/2013 9:06 AM

## 2014-01-11 ENCOUNTER — Encounter (INDEPENDENT_AMBULATORY_CARE_PROVIDER_SITE_OTHER): Payer: BC Managed Care – PPO | Admitting: Surgery

## 2014-02-22 ENCOUNTER — Ambulatory Visit (INDEPENDENT_AMBULATORY_CARE_PROVIDER_SITE_OTHER): Payer: Self-pay | Admitting: Surgery

## 2014-02-22 NOTE — H&P (Signed)
History of Present Illness Dan Lester. Dan Lester; 02/22/2014 12:43 PM) Patient words: post-op.  The patient is a 68 year old male who presents with an inguinal hernia. This is a 68 yo male who is s/p left inguinal hernia repair with mesh in 2013 for a large indirect hernia. He has developed a recurrent bulge in this area that is beginning to cause some discomfort and asymmetry in his testicles. He is currently being treated for a thigh abscess that is healing slowly. He underwent gallbladder surgery earlier this year and is doing well from that standpoint. He would like to have his recurrent hernia repaired.   Allergies ([name], CMA; 02/22/2014 8:59 AM) No Known Drug Allergies10/12/2013  Medication History ([name], CMA; 02/22/2014 8:59 AM) NexIUM (40MG  Capsule DR, Oral daily) Active. Synthroid (200MCG Tablet, Oral daily) Active. BuPROPion HCl ER (XL) (150MG  Tablet ER 24HR, Oral daily) Active. Advair Diskus (250-50MCG/DOSE Aero Pow Br Act, Inhalation daily) Active.  Review of Systems Dan Lester. Dan Lester; 02/22/2014 12:44 PM) General Not Present- Appetite Loss, Chills, Fatigue, Fever, Night Sweats, Weight Gain and Weight Loss. Skin Not Present- Change in Wart/Mole, Dryness, Hives, Jaundice, New Lesions, Non-Healing Wounds, Rash and Ulcer. HEENT Not Present- Earache, Hearing Loss, Hoarseness, Nose Bleed, Oral Ulcers, Ringing in the Ears, Seasonal Allergies, Sinus Pain, Sore Throat, Visual Disturbances, Wears glasses/contact lenses and Yellow Eyes. Respiratory Not Present- Bloody sputum, Chronic Cough, Difficulty Breathing, Snoring and Wheezing. Breast Not Present- Breast Mass, Breast Pain, Nipple Discharge and Skin Changes. Cardiovascular Not Present- Chest Pain, Difficulty Breathing Lying Down, Leg Cramps, Palpitations, Rapid Heart Rate, Shortness of Breath and Swelling of Extremities. Gastrointestinal Not Present- Abdominal Pain, Bloating, Bloody Stool, Change in Bowel  Habits, Chronic diarrhea, Constipation, Difficulty Swallowing, Excessive gas, Gets full quickly at meals, Hemorrhoids, Indigestion, Nausea, Rectal Pain and Vomiting. Male Genitourinary Not Present- Blood in Urine, Change in Urinary Stream, Frequency, Impotence, Nocturia, Painful Urination, Urgency and Urine Leakage. Musculoskeletal Not Present- Back Pain, Joint Pain, Joint Stiffness, Muscle Pain, Muscle Weakness and Swelling of Extremities. Neurological Not Present- Decreased Memory, Fainting, Headaches, Numbness, Seizures, Tingling, Tremor, Trouble walking and Weakness. Psychiatric Not Present- Anxiety, Bipolar, Change in Sleep Pattern, Depression, Fearful and Frequent crying. Endocrine Not Present- Cold Intolerance, Excessive Hunger, Hair Changes, Heat Intolerance and New Diabetes. Hematology Not Present- Easy Bruising, Excessive bleeding, Gland problems, HIV and Persistent Infections.   Vitals ([name] CMA; 02/22/2014 8:59 AM) 02/22/2014 8:59 AM Weight: 175 lb Height: 74in Body Surface Area: 2.04 m Body Mass Index: 22.47 kg/m Temp.: 97.35F(Temporal)  Pulse: 75 (Regular)  BP: 134/78 (Sitting, Left Arm, Standard)    Physical Exam Dan Key K. Dan Lester; 02/22/2014 12:45 PM) The physical exam findings are as follows: Note:WDWN in NAD HEENT: EOMI, sclera anicteric Neck: No masses, no thyromegaly Lungs: CTA bilaterally; normal respiratory effort CV: Regular rate and rhythm; no murmurs Abd: +bowel sounds, soft, non-tender, no masses GU: bilateral descended testes - left side lower than right; no testicular masses; palpable reducible left inguinal hernia; no sign of right inguinal hernia Ext: Well-perfused; no edema Skin: Warm, dry; no sign of jaundice    Assessment & Plan Dan Key K. 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 The  surgical procedure has been discussed with the patient. Potential risks, benefits, alternative treatments, and expected outcomes have been explained. All of the patient's questions at this time have been answered.  The likelihood of reaching the patient's treatment goal is good. The patient understand the proposed surgical procedure and wishes to proceed.   Dan Lester. Dan Dover, Lester, [facility] Surgery  General/ Trauma Surgery  02/22/2014 12:48 PM

## 2014-06-10 ENCOUNTER — Encounter (HOSPITAL_BASED_OUTPATIENT_CLINIC_OR_DEPARTMENT_OTHER): Payer: BLUE CROSS/BLUE SHIELD | Attending: Plastic Surgery

## 2014-06-10 DIAGNOSIS — L728 Other follicular cysts of the skin and subcutaneous tissue: Secondary | ICD-10-CM | POA: Insufficient documentation

## 2014-06-10 DIAGNOSIS — L98419 Non-pressure chronic ulcer of buttock with unspecified severity: Secondary | ICD-10-CM | POA: Diagnosis not present

## 2014-06-11 NOTE — Progress Notes (Signed)
[facility]  NAME:  Dan Lester, Dan Lester NO.:  [id number]  MEDICAL RECORD NO.:  [id number]      DATE OF BIRTH:  1946/04/23  PHYSICIAN:  Theodoro Kos, DO       VISIT DATE:  06/10/2014                                  OFFICE VISIT   The patient is a 69 year old male who is here for an evaluation of a right ischial ulcer.  He underwent treatment of an abscess that he states got quite large, red, tender, and then broke.  He has the area debrided on a number of occasions and it has gotten a little bit better but over the last several months, it has stalled out and there has not been any progress.  He has been dealing with this for at least 6 months. Currently he is not packing it, just doing a dry dressing on the outside.  PAST SURGICAL HISTORY:  Positive for inguinal hernia repair on the left.  ALLERGIES:  No known drug allergies.  MEDICATIONS:  He is on Wellbutrin, Nexium, Advair, and Synthroid.  He is married.  Lives at home.  His wife has been helping him with the dressings.  REVIEW OF SYSTEMS:  Otherwise negative.  Although he was recently diagnosed with diabetes but it is diet controlled.  PHYSICAL EXAMINATION:  He is alert, oriented, and cooperative.  He is around 74 inches and weighs 175 pounds.  He is pleasant, not in any distress.  His breathing is unlabored.  His heart rate is regular.  His abdomen is soft.  The wound is on the right ischial ulcer.  Sizes are noted in the chart.  It does look clean but it is a little pale.  There was an attempt at closure and it opened back up several weeks ago.  My recommendation is to check a pre-albumin to stay off, so offload is very important.  Increase protein, multivitamin, vitamin C, zinc, and probably will need to go to the OR for debridement and possible placement of ACell and the patient is in agreement for this.     Theodoro Kos, DO     CS/MEDQ  D:  06/10/2014  T:  06/11/2014   Job:  [id number]

## 2014-06-17 DIAGNOSIS — L728 Other follicular cysts of the skin and subcutaneous tissue: Secondary | ICD-10-CM | POA: Diagnosis not present

## 2014-06-17 DIAGNOSIS — L98419 Non-pressure chronic ulcer of buttock with unspecified severity: Secondary | ICD-10-CM | POA: Diagnosis not present

## 2014-07-01 ENCOUNTER — Encounter (HOSPITAL_BASED_OUTPATIENT_CLINIC_OR_DEPARTMENT_OTHER): Payer: BLUE CROSS/BLUE SHIELD | Attending: Plastic Surgery

## 2014-07-01 DIAGNOSIS — S31819D Unspecified open wound of right buttock, subsequent encounter: Secondary | ICD-10-CM | POA: Insufficient documentation

## 2014-07-01 DIAGNOSIS — Z9889 Other specified postprocedural states: Secondary | ICD-10-CM | POA: Insufficient documentation

## 2014-07-15 DIAGNOSIS — Z9889 Other specified postprocedural states: Secondary | ICD-10-CM | POA: Diagnosis not present

## 2014-07-15 DIAGNOSIS — S31819D Unspecified open wound of right buttock, subsequent encounter: Secondary | ICD-10-CM | POA: Diagnosis not present

## 2015-08-19 NOTE — H&P (Signed)
  Subjective:    Patient ID: Dan Lester is a 70 y.o. male.  Follow-up    Status post debridement and placement Acell over chronic right buttock ulcer that has been present since 10/2013 following I&D for soft tissue infection by Dr. [name]. Attempted primary closure 4/16 with dehiscence within first few days. Patient with diminished sensation buttock following spine surgery and pressure has been felt to be in part reason for non healing woundReports every other dressing change placing collagen in wound. Last visit Medihoney started- notes no change.       Objective:   Physical Exam  Cardiovascular: Normal rate, regular rhythm and normal heart sounds.  Pulmonary/Chest: Effort normal and breath sounds normal.  Skin:  Right buttock wound- The wound measures 1.5 x 0.4 x 0.3 cm , moderate serous drainage on dressing.   epithelization curving over wound margins, undermining to 1 cm at 7 o clock Minimal sensation in area of wound     Assessment:     Chronic ulcer buttock     Plan:     Wound unchanged with addition Medihoney. Has failed to heal with Medihoney, collagen, A Cell, alginate. Plan to try excision chronic wound and closure again. Plan in OR so I can position patient better and have cautery. I recommend he take time off work so he has several days with no pressure on wound; he reports he is unable to to this. He tries to offload by standing frequently. Reviewed risks anesthesia, infection, recurrent dehiscence.   Irene Limbo, MD [facility] Plastic & Reconstructive Surgery [phone]

## 2015-08-22 ENCOUNTER — Encounter (HOSPITAL_BASED_OUTPATIENT_CLINIC_OR_DEPARTMENT_OTHER): Payer: Self-pay | Admitting: *Deleted

## 2015-08-29 ENCOUNTER — Ambulatory Visit (HOSPITAL_BASED_OUTPATIENT_CLINIC_OR_DEPARTMENT_OTHER): Payer: 59 | Admitting: Anesthesiology

## 2015-08-29 ENCOUNTER — Encounter (HOSPITAL_BASED_OUTPATIENT_CLINIC_OR_DEPARTMENT_OTHER): Payer: Self-pay | Admitting: *Deleted

## 2015-08-29 ENCOUNTER — Ambulatory Visit (HOSPITAL_BASED_OUTPATIENT_CLINIC_OR_DEPARTMENT_OTHER)
Admission: RE | Admit: 2015-08-29 | Discharge: 2015-08-29 | Disposition: A | Discharge status: Home / Self Care | Payer: 59 | Source: Ambulatory Visit | Attending: Plastic Surgery | Admitting: Plastic Surgery

## 2015-08-29 ENCOUNTER — Encounter (HOSPITAL_BASED_OUTPATIENT_CLINIC_OR_DEPARTMENT_OTHER)
Admission: RE | Disposition: A | Discharge status: Home / Self Care | Payer: Self-pay | Source: Ambulatory Visit | Attending: Plastic Surgery

## 2015-08-29 DIAGNOSIS — L89319 Pressure ulcer of right buttock, unspecified stage: Secondary | ICD-10-CM | POA: Diagnosis not present

## 2015-08-29 DIAGNOSIS — E119 Type 2 diabetes mellitus without complications: Secondary | ICD-10-CM | POA: Insufficient documentation

## 2015-08-29 DIAGNOSIS — Z87891 Personal history of nicotine dependence: Secondary | ICD-10-CM | POA: Diagnosis not present

## 2015-08-29 HISTORY — PX: DEBRIDMENT OF DECUBITUS ULCER: SHX6276

## 2015-08-29 SURGERY — DEBRIDMENT OF DECUBITUS ULCER
Anesthesia: General | Site: Buttocks | Laterality: Right

## 2015-08-29 MED ORDER — FENTANYL CITRATE (PF) 100 MCG/2ML IJ SOLN: 25.0000 ug | INTRAMUSCULAR | Status: DC | PRN

## 2015-08-29 MED ORDER — CEFAZOLIN SODIUM-DEXTROSE 2-4 GM/100ML-% IV SOLN
2.0000 g | INTRAVENOUS | Status: AC
Start: 2015-08-29 — End: 2015-08-29
  Administered 2015-08-29: 2 g via INTRAVENOUS

## 2015-08-29 MED ORDER — PROPOFOL 10 MG/ML IV BOLUS
INTRAVENOUS | Status: AC
  Filled 2015-08-29: qty 20

## 2015-08-29 MED ORDER — EPHEDRINE SULFATE 50 MG/ML IJ SOLN
INTRAMUSCULAR | Status: DC | PRN
  Administered 2015-08-29: 15 mg via INTRAVENOUS

## 2015-08-29 MED ORDER — FENTANYL CITRATE (PF) 100 MCG/2ML IJ SOLN
INTRAMUSCULAR | Status: AC
  Filled 2015-08-29: qty 2

## 2015-08-29 MED ORDER — HYDROCODONE-ACETAMINOPHEN 5-325 MG PO TABS: 1.0000 | ORAL_TABLET | ORAL | Status: DC | PRN

## 2015-08-29 MED ORDER — LIDOCAINE HCL (CARDIAC) 20 MG/ML IV SOLN
INTRAVENOUS | Status: DC | PRN
  Administered 2015-08-29: 50 mg via INTRAVENOUS

## 2015-08-29 MED ORDER — MIDAZOLAM HCL 2 MG/2ML IJ SOLN
1.0000 mg | INTRAMUSCULAR | Status: DC | PRN
  Administered 2015-08-29: 2 mg via INTRAVENOUS

## 2015-08-29 MED ORDER — FENTANYL CITRATE (PF) 100 MCG/2ML IJ SOLN
50.0000 ug | INTRAMUSCULAR | Status: DC | PRN
  Administered 2015-08-29: 100 ug via INTRAVENOUS

## 2015-08-29 MED ORDER — ONDANSETRON HCL 4 MG/2ML IJ SOLN
INTRAMUSCULAR | Status: DC | PRN
  Administered 2015-08-29: 4 mg via INTRAVENOUS

## 2015-08-29 MED ORDER — PROPOFOL 10 MG/ML IV BOLUS
INTRAVENOUS | Status: DC | PRN
  Administered 2015-08-29: 150 mg via INTRAVENOUS

## 2015-08-29 MED ORDER — ONDANSETRON HCL 4 MG/2ML IJ SOLN: 4.0000 mg | Freq: Once | INTRAMUSCULAR | Status: DC | PRN

## 2015-08-29 MED ORDER — GLYCOPYRROLATE 0.2 MG/ML IJ SOLN: 0.2000 mg | Freq: Once | INTRAMUSCULAR | Status: DC | PRN

## 2015-08-29 MED ORDER — SUCCINYLCHOLINE CHLORIDE 20 MG/ML IJ SOLN
INTRAMUSCULAR | Status: DC | PRN
  Administered 2015-08-29: 100 mg via INTRAVENOUS

## 2015-08-29 MED ORDER — OXYCODONE HCL 5 MG/5ML PO SOLN: 5.0000 mg | Freq: Once | ORAL | Status: DC | PRN

## 2015-08-29 MED ORDER — CEFAZOLIN SODIUM-DEXTROSE 2-4 GM/100ML-% IV SOLN
INTRAVENOUS | Status: AC
  Filled 2015-08-29: qty 100

## 2015-08-29 MED ORDER — DEXAMETHASONE SODIUM PHOSPHATE 4 MG/ML IJ SOLN
INTRAMUSCULAR | Status: DC | PRN
Start: 2015-08-29 — End: 2015-08-29
  Administered 2015-08-29: 10 mg via INTRAVENOUS

## 2015-08-29 MED ORDER — OXYCODONE HCL 5 MG PO TABS: 5.0000 mg | ORAL_TABLET | Freq: Once | ORAL | Status: DC | PRN

## 2015-08-29 MED ORDER — SCOPOLAMINE 1 MG/3DAYS TD PT72: 1.0000 | MEDICATED_PATCH | Freq: Once | TRANSDERMAL | Status: DC | PRN

## 2015-08-29 MED ORDER — MIDAZOLAM HCL 2 MG/2ML IJ SOLN
INTRAMUSCULAR | Status: AC
  Filled 2015-08-29: qty 2

## 2015-08-29 MED ORDER — LACTATED RINGERS IV SOLN
INTRAVENOUS | Status: DC
  Administered 2015-08-29 (×2): via INTRAVENOUS

## 2015-08-29 SURGICAL SUPPLY — 43 items
BENZOIN TINCTURE PRP APPL 2/3 (GAUZE/BANDAGES/DRESSINGS) ×3 IMPLANT
BLADE CLIPPER SURG (BLADE) ×3 IMPLANT
BLADE SURG 15 STRL LF DISP TIS (BLADE) ×1 IMPLANT
BLADE SURG 15 STRL SS (BLADE) ×2
BRIEF STRETCH FOR OB PAD LRG (UNDERPADS AND DIAPERS) IMPLANT
CANISTER SUCT 1200ML W/VALVE (MISCELLANEOUS) IMPLANT
COVER BACK TABLE 60X90IN (DRAPES) ×3 IMPLANT
COVER MAYO STAND STRL (DRAPES) ×3 IMPLANT
DRAIN CHANNEL 15F RND FF W/TCR (WOUND CARE) IMPLANT
DRAPE LAPAROTOMY T 102X78X121 (DRAPES) ×3 IMPLANT
DRSG PAD ABDOMINAL 8X10 ST (GAUZE/BANDAGES/DRESSINGS) ×3 IMPLANT
ELECT COATED BLADE 2.86 ST (ELECTRODE) ×3 IMPLANT
ELECT REM PT RETURN 9FT ADLT (ELECTROSURGICAL) ×3
ELECTRODE REM PT RTRN 9FT ADLT (ELECTROSURGICAL) ×1 IMPLANT
GLOVE BIO SURGEON STRL SZ 6 (GLOVE) ×6 IMPLANT
GLOVE BIOGEL PI IND STRL 7.0 (GLOVE) ×1 IMPLANT
GLOVE BIOGEL PI INDICATOR 7.0 (GLOVE) ×2
GLOVE ECLIPSE 6.5 STRL STRAW (GLOVE) ×3 IMPLANT
GLOVE EXAM NITRILE EXT CUFF MD (GLOVE) ×3 IMPLANT
GOWN STRL REUS W/ TWL LRG LVL3 (GOWN DISPOSABLE) ×2 IMPLANT
GOWN STRL REUS W/TWL LRG LVL3 (GOWN DISPOSABLE) ×4
LIQUID BAND (GAUZE/BANDAGES/DRESSINGS) IMPLANT
NS IRRIG 1000ML POUR BTL (IV SOLUTION) ×3 IMPLANT
PACK BASIN DAY SURGERY FS (CUSTOM PROCEDURE TRAY) ×3 IMPLANT
PENCIL BUTTON HOLSTER BLD 10FT (ELECTRODE) ×3 IMPLANT
PIN SAFETY STERILE (MISCELLANEOUS) IMPLANT
SLEEVE SCD COMPRESS KNEE MED (MISCELLANEOUS) ×3 IMPLANT
SPONGE GAUZE 4X4 12PLY STER LF (GAUZE/BANDAGES/DRESSINGS) IMPLANT
SUCTION FRAZIER HANDLE 10FR (MISCELLANEOUS)
SUCTION TUBE FRAZIER 10FR DISP (MISCELLANEOUS) IMPLANT
SURGILUBE 2OZ TUBE FLIPTOP (MISCELLANEOUS) IMPLANT
SUT ETHILON 2 0 FS 18 (SUTURE) ×3 IMPLANT
SUT ETHILON 4 0 PS 2 18 (SUTURE) IMPLANT
SUT MNCRL AB 4-0 PS2 18 (SUTURE) IMPLANT
SUT PDS AB 2-0 CT2 27 (SUTURE) ×3 IMPLANT
SUT VIC AB 3-0 FS2 27 (SUTURE) ×3 IMPLANT
SUT VICRYL 4-0 PS2 18IN ABS (SUTURE) IMPLANT
SYR BULB 3OZ (MISCELLANEOUS) ×3 IMPLANT
SYR CONTROL 10ML LL (SYRINGE) IMPLANT
TRAY DSU PREP LF (CUSTOM PROCEDURE TRAY) ×3 IMPLANT
TUBE CONNECTING 20'X1/4 (TUBING)
TUBE CONNECTING 20X1/4 (TUBING) IMPLANT
YANKAUER SUCT BULB TIP NO VENT (SUCTIONS) IMPLANT

## 2015-08-29 NOTE — Interval H&P Note (Signed)
History and Physical Interval Note:  08/29/2015 8:39 AM  [name]  has presented today for surgery, with the diagnosis of CHRONIC ULCER RIGHT BUTTOCKS  The various methods of treatment have been discussed with the patient and family. After consideration of risks, benefits and other options for treatment, the patient has consented to  Procedure(s): COMPLEX REPAIR BUTTOCKS 3CM (N/A) as a surgical intervention .  The patient's history has been reviewed, patient examined, no change in status, stable for surgery.  I have reviewed the patient's chart and labs.  Questions were answered to the patient's satisfaction.     Dan Lester

## 2015-08-29 NOTE — Anesthesia Postprocedure Evaluation (Signed)
Anesthesia Post Note  Patient: Dan Lester Doctor  Procedure(s) Performed: Procedure(s) (LRB): COMPLEX REPAIR BUTTOCKS 3CM (Right)  Patient location during evaluation: PACU Anesthesia Type: General Level of consciousness: awake and alert Pain management: pain level controlled Vital Signs Assessment: post-procedure vital signs reviewed and stable Respiratory status: spontaneous breathing, nonlabored ventilation, respiratory function stable and patient connected to nasal cannula oxygen Cardiovascular status: blood pressure returned to baseline and stable Postop Assessment: no signs of nausea or vomiting Anesthetic complications: no    Last Vitals:  Filed Vitals:   08/29/15 1145 08/29/15 1215  BP: 144/86 152/87  Pulse: 81 88  Temp:  36.4 C  Resp: 16 18    Last Pain:  Filed Vitals:   08/29/15 1218  PainSc: 0-No pain                 [name]

## 2015-08-29 NOTE — Discharge Instructions (Signed)

## 2015-08-29 NOTE — Anesthesia Preprocedure Evaluation (Signed)
Anesthesia Evaluation  Patient identified by MRN, date of birth, ID band Patient awake    Reviewed: Allergy & Precautions, NPO status , Patient's Chart, lab work & pertinent test results  History of Anesthesia Complications Negative for: history of anesthetic complications  Airway Mallampati: II  TM Distance: >3 FB Neck ROM: Full    Dental no notable dental hx. (+) Dental Advisory Given   Pulmonary asthma , former smoker,    Pulmonary exam normal breath sounds clear to auscultation       Cardiovascular negative cardio ROS Normal cardiovascular exam Rhythm:Regular Rate:Normal     Neuro/Psych negative neurological ROS  negative psych ROS   GI/Hepatic Neg liver ROS, GERD  ,  Endo/Other  diabetes  Renal/GU negative Renal ROS  negative genitourinary   Musculoskeletal  (+) Arthritis ,   Abdominal   Peds negative pediatric ROS (+)  Hematology negative hematology ROS (+)   Anesthesia Other Findings   Reproductive/Obstetrics negative OB ROS                             Anesthesia Physical Anesthesia Plan  ASA: II  Anesthesia Plan: General   Post-op Pain Management:    Induction: Intravenous  Airway Management Planned: Oral ETT  Additional Equipment:   Intra-op Plan:   Post-operative Plan: Extubation in OR  Informed Consent: I have reviewed the patients History and Physical, chart, labs and discussed the procedure including the risks, benefits and alternatives for the proposed anesthesia with the patient or authorized representative who has indicated his/her understanding and acceptance.   Dental advisory given  Plan Discussed with: CRNA  Anesthesia Plan Comments:         Anesthesia Quick Evaluation

## 2015-08-29 NOTE — Transfer of Care (Signed)
Immediate Anesthesia Transfer of Care Note  Patient: Dan Lester Doctor  Procedure(s) Performed: Procedure(s): COMPLEX REPAIR BUTTOCKS 3CM (Right)  Patient Location: PACU  Anesthesia Type:General  Level of Consciousness: awake and alert   Airway & Oxygen Therapy: Patient Spontanous Breathing and Patient connected to face mask oxygen  Post-op Assessment: Report given to RN and Post -op Vital signs reviewed and stable  Post vital signs: Reviewed and stable  Last Vitals:  Filed Vitals:   08/29/15 0856  BP: 148/91  Pulse: 81  Temp: 36.7 C  Resp: 20    Last Pain: There were no vitals filed for this visit.       Complications: No apparent anesthesia complications

## 2015-08-29 NOTE — Op Note (Signed)
Operative Note   DATE OF OPERATION: 5.5.17  LOCATION: [facility]-observation  SURGICAL DIVISION: Plastic Surgery  PREOPERATIVE DIAGNOSES:  1. Open wound buttock 2. Chronic non pressure ulcer buttock  POSTOPERATIVE DIAGNOSES:  same  PROCEDURE:  1 Complex repair buttock 3.5 cm  SURGEON: Irene Limbo MD MBA  ASSISTANT: none  ANESTHESIA:  General.   EBL: 10 ml  COMPLICATIONS: None immediate.   INDICATIONS FOR PROCEDURE:  The patient, Dan Lester, is a 70 y.o. male born on 1945-11-13, is here for excision and closure of chronic ulcer present for nearly 2 years following treatment soft tissue infection that has failed local wound care.   FINDINGS: Ulcer with pseudo epithelization and bursa excised.  DESCRIPTION OF PROCEDURE:  The patient's operative site was marked with the patient in the preoperative area. The patient was taken to the operating room. SCDs were placed and IV antibiotics were given. Following induction general anesthesia, the patient was placed in prone position. The patient's operative site was prepped and draped in a sterile fashion. A time out was performed and all information was confirmed to be correct. Sharp excision of skin margins completed with knife and entirety of ulcer cavity and underlying bursa excised with cautery. Wound irrigated and hemostasis obtained. Layered closure completed with 2-0 PDS in superficial fascia layer. A second layer of 2-0 PDS used to approximate deep subcutaneous tissue and 3-0 vicryl placed in dermis. Interrupted 3-0 nylon used for skin closure simple interrupted and horizontal mattress sutures, length final closure 3.5 cm. Dry dressing applied and patient returned to supine position.   The patient was allowed to wake from anesthesia, extubated and taken to the recovery room in satisfactory condition.   SPECIMENS: chronic ulcer right buttock  DRAINS: none  Irene Limbo, MD [facility] Plastic & Reconstructive  Surgery [phone]

## 2015-08-29 NOTE — Anesthesia Procedure Notes (Signed)
Procedure Name: Intubation Date/Time: 08/29/2015 10:10 AM Performed by: Melynda Ripple D Pre-anesthesia Checklist: Patient identified, Emergency Drugs available, Suction available and Patient being monitored Patient Re-evaluated:Patient Re-evaluated prior to inductionOxygen Delivery Method: Circle System Utilized Preoxygenation: Pre-oxygenation with 100% oxygen Intubation Type: IV induction Ventilation: Mask ventilation without difficulty Tube type: Oral Number of attempts: 1 Airway Equipment and Method: Stylet and Oral airway Placement Confirmation: ETT inserted through vocal cords under direct vision,  positive ETCO2 and breath sounds checked- equal and bilateral Secured at: 23 cm Tube secured with: Tape Dental Injury: Teeth and Oropharynx as per pre-operative assessment

## 2015-09-01 ENCOUNTER — Encounter (HOSPITAL_BASED_OUTPATIENT_CLINIC_OR_DEPARTMENT_OTHER): Payer: Self-pay | Admitting: Plastic Surgery

## 2016-04-28 DIAGNOSIS — S31819D Unspecified open wound of right buttock, subsequent encounter: Secondary | ICD-10-CM | POA: Diagnosis not present

## 2016-07-07 DIAGNOSIS — S31819D Unspecified open wound of right buttock, subsequent encounter: Secondary | ICD-10-CM | POA: Diagnosis not present

## 2016-07-26 DIAGNOSIS — Z1389 Encounter for screening for other disorder: Secondary | ICD-10-CM | POA: Diagnosis not present

## 2016-07-26 DIAGNOSIS — R030 Elevated blood-pressure reading, without diagnosis of hypertension: Secondary | ICD-10-CM | POA: Diagnosis not present

## 2016-07-26 DIAGNOSIS — H8110 Benign paroxysmal vertigo, unspecified ear: Secondary | ICD-10-CM | POA: Diagnosis not present

## 2016-07-26 DIAGNOSIS — E119 Type 2 diabetes mellitus without complications: Secondary | ICD-10-CM | POA: Diagnosis not present

## 2016-09-08 DIAGNOSIS — S31819D Unspecified open wound of right buttock, subsequent encounter: Secondary | ICD-10-CM | POA: Diagnosis not present

## 2016-09-13 DIAGNOSIS — S31819D Unspecified open wound of right buttock, subsequent encounter: Secondary | ICD-10-CM | POA: Diagnosis not present

## 2016-10-05 DIAGNOSIS — Z6824 Body mass index (BMI) 24.0-24.9, adult: Secondary | ICD-10-CM | POA: Diagnosis not present

## 2016-10-05 DIAGNOSIS — L0231 Cutaneous abscess of buttock: Secondary | ICD-10-CM | POA: Diagnosis not present

## 2016-10-08 DIAGNOSIS — L0231 Cutaneous abscess of buttock: Secondary | ICD-10-CM | POA: Diagnosis not present

## 2016-10-08 DIAGNOSIS — Z6824 Body mass index (BMI) 24.0-24.9, adult: Secondary | ICD-10-CM | POA: Diagnosis not present

## 2016-10-13 DIAGNOSIS — S31819D Unspecified open wound of right buttock, subsequent encounter: Secondary | ICD-10-CM | POA: Diagnosis not present

## 2016-10-14 ENCOUNTER — Other Ambulatory Visit: Payer: Self-pay | Admitting: Plastic Surgery

## 2016-10-14 DIAGNOSIS — S31819D Unspecified open wound of right buttock, subsequent encounter: Secondary | ICD-10-CM

## 2016-10-14 DIAGNOSIS — R1903 Right lower quadrant abdominal swelling, mass and lump: Secondary | ICD-10-CM

## 2016-10-22 ENCOUNTER — Ambulatory Visit
Admission: RE | Admit: 2016-10-22 | Discharge: 2016-10-22 | Disposition: A | Discharge status: Home / Self Care | Payer: 59 | Source: Ambulatory Visit | Attending: Plastic Surgery | Admitting: Plastic Surgery

## 2016-10-22 DIAGNOSIS — S31819D Unspecified open wound of right buttock, subsequent encounter: Secondary | ICD-10-CM | POA: Diagnosis not present

## 2016-10-22 DIAGNOSIS — R1903 Right lower quadrant abdominal swelling, mass and lump: Secondary | ICD-10-CM | POA: Diagnosis not present

## 2016-10-25 DIAGNOSIS — E119 Type 2 diabetes mellitus without complications: Secondary | ICD-10-CM | POA: Diagnosis not present

## 2016-10-25 DIAGNOSIS — E038 Other specified hypothyroidism: Secondary | ICD-10-CM | POA: Diagnosis not present

## 2016-10-25 DIAGNOSIS — Z0000 Encounter for general adult medical examination without abnormal findings: Secondary | ICD-10-CM | POA: Diagnosis not present

## 2016-10-25 DIAGNOSIS — Z125 Encounter for screening for malignant neoplasm of prostate: Secondary | ICD-10-CM | POA: Diagnosis not present

## 2016-11-01 DIAGNOSIS — Z0000 Encounter for general adult medical examination without abnormal findings: Secondary | ICD-10-CM | POA: Diagnosis not present

## 2016-11-01 DIAGNOSIS — Z23 Encounter for immunization: Secondary | ICD-10-CM | POA: Diagnosis not present

## 2016-11-01 DIAGNOSIS — E038 Other specified hypothyroidism: Secondary | ICD-10-CM | POA: Diagnosis not present

## 2016-11-01 DIAGNOSIS — E784 Other hyperlipidemia: Secondary | ICD-10-CM | POA: Diagnosis not present

## 2016-11-02 DIAGNOSIS — Z1212 Encounter for screening for malignant neoplasm of rectum: Secondary | ICD-10-CM | POA: Diagnosis not present

## 2016-11-11 DIAGNOSIS — S31819D Unspecified open wound of right buttock, subsequent encounter: Secondary | ICD-10-CM | POA: Diagnosis not present

## 2016-12-09 DIAGNOSIS — S31819D Unspecified open wound of right buttock, subsequent encounter: Secondary | ICD-10-CM | POA: Diagnosis not present

## 2017-01-03 ENCOUNTER — Other Ambulatory Visit: Payer: Self-pay | Admitting: Surgery

## 2017-01-03 DIAGNOSIS — L02419 Cutaneous abscess of limb, unspecified: Secondary | ICD-10-CM | POA: Diagnosis not present

## 2017-01-03 DIAGNOSIS — S31819A Unspecified open wound of right buttock, initial encounter: Secondary | ICD-10-CM

## 2017-01-11 ENCOUNTER — Other Ambulatory Visit: Payer: 59

## 2017-01-14 ENCOUNTER — Ambulatory Visit
Admission: RE | Admit: 2017-01-14 | Discharge: 2017-01-14 | Disposition: A | Discharge status: Home / Self Care | Payer: 59 | Source: Ambulatory Visit | Attending: Surgery | Admitting: Surgery

## 2017-01-14 DIAGNOSIS — K4090 Unilateral inguinal hernia, without obstruction or gangrene, not specified as recurrent: Secondary | ICD-10-CM | POA: Diagnosis not present

## 2017-01-14 DIAGNOSIS — S31819A Unspecified open wound of right buttock, initial encounter: Secondary | ICD-10-CM

## 2017-01-14 MED ORDER — IOPAMIDOL (ISOVUE-300) INJECTION 61%
100.0000 mL | Freq: Once | INTRAVENOUS | Status: AC | PRN
  Administered 2017-01-14: 100 mL via INTRAVENOUS

## 2017-01-18 ENCOUNTER — Other Ambulatory Visit: Payer: Self-pay | Admitting: Surgery

## 2017-01-18 DIAGNOSIS — L98493 Non-pressure chronic ulcer of skin of other sites with necrosis of muscle: Secondary | ICD-10-CM

## 2017-01-18 NOTE — Progress Notes (Signed)
I spoke with the patient.  We are getting an MRI to rule out osteomyelitis of the inferior pubic ramus.  I'll give you the orders.

## 2017-01-31 ENCOUNTER — Ambulatory Visit
Admission: RE | Admit: 2017-01-31 | Discharge: 2017-01-31 | Disposition: A | Discharge status: Home / Self Care | Payer: 59 | Source: Ambulatory Visit | Attending: Surgery | Admitting: Surgery

## 2017-01-31 DIAGNOSIS — L98493 Non-pressure chronic ulcer of skin of other sites with necrosis of muscle: Secondary | ICD-10-CM

## 2017-01-31 DIAGNOSIS — R102 Pelvic and perineal pain: Secondary | ICD-10-CM | POA: Diagnosis not present

## 2017-01-31 MED ORDER — GADOBENATE DIMEGLUMINE 529 MG/ML IV SOLN
15.0000 mL | Freq: Once | INTRAVENOUS | Status: AC | PRN
  Administered 2017-01-31: 15 mL via INTRAVENOUS

## 2017-02-11 DIAGNOSIS — S31819D Unspecified open wound of right buttock, subsequent encounter: Secondary | ICD-10-CM | POA: Diagnosis not present

## 2017-02-28 DIAGNOSIS — R030 Elevated blood-pressure reading, without diagnosis of hypertension: Secondary | ICD-10-CM | POA: Diagnosis not present

## 2017-02-28 DIAGNOSIS — Z23 Encounter for immunization: Secondary | ICD-10-CM | POA: Diagnosis not present

## 2017-02-28 DIAGNOSIS — E7849 Other hyperlipidemia: Secondary | ICD-10-CM | POA: Diagnosis not present

## 2017-02-28 DIAGNOSIS — E038 Other specified hypothyroidism: Secondary | ICD-10-CM | POA: Diagnosis not present

## 2017-03-02 DIAGNOSIS — S31819D Unspecified open wound of right buttock, subsequent encounter: Secondary | ICD-10-CM | POA: Diagnosis not present

## 2017-03-02 NOTE — Progress Notes (Signed)
Dan Lester            03/02/2017                          [place], [place] [facility] [address] [facility] Phone: [phone] Fax: [phone]  [facility] - [place], [place] - [address] [place] [address] Phone: [phone] Fax: [phone]  [facility] - [address], [name] [address] Phone: [phone] Fax: [phone]              Your procedure is scheduled on March 07, 2017.            Report to [facility] Admitting at [phone] AM.            Call this number if you have problems the morning of surgery:            [phone]             Remember:            Do not eat food or drink liquids after midnight.            Take these medicines the morning of surgery with A SIP OF WATER bupropion (wellbutrin), fluticasone (flonase), fluticasone-salmeterol (advair) inhaler, levothyroxine (synthroid), protonix (pantoprazole).  7 days prior to surgery STOP taking any nabumetone (relafen), Aspirin (unless otherwise instructed by your surgeon), Aleve, Naproxen, Ibuprofen, Motrin, Advil, Goody's, BC's, all herbal medications, fish oil, and all vitamins  Continue all other medications as instructed by your physician except follow the above medication instructions before surgery  WHAT DO I DO ABOUT MY DIABETES MEDICATION?    Do not take oral diabetes medicines (pills) the morning of surgery.    The day of surgery, do not take other diabetes injectables, including Byetta (exenatide), Bydureon (exenatide ER), Victoza (liraglutide), or Trulicity (dulaglutide).    HOW TO MANAGE YOUR DIABETES BEFORE AND AFTER SURGERY  Why is it important to control my blood sugar before and after surgery?  Improving blood sugar  levels before and after surgery helps healing and can limit problems.  A way of improving blood sugar control is eating a healthy diet by: ?  Eating less sugar and carbohydrates ?  Increasing activity/exercise ?  Talking with your doctor about reaching your blood sugar goals  High blood sugars (greater than 180 mg/dL) can raise your risk of infections and slow your recovery, so you will need to focus on controlling your diabetes during the weeks before surgery.  Make sure that the doctor who takes care of your diabetes knows about your planned surgery including the date and location.  How do I manage my blood sugar before surgery?  Check your blood sugar at least 4 times a day, starting 2 days before surgery, to make sure that the level is not too high or low. ? Check your blood sugar the morning of your surgery when you wake up and every 2 hours until you get to the Short Stay unit.  If your blood sugar is less than 70 mg/dL, you will need to treat for low blood sugar: ? Do not take insulin. ? Treat a low blood sugar (  less than 70 mg/dL) with  cup of clear juice (cranberry or apple), 4glucose tablets, OR glucose gel. Recheck blood sugar in 15 minutes after treatment (to make sure it is greater than 70 mg/dL). If your blood sugar is not greater than 70 mg/dL on recheck, call [phone] ?  for further instructions.  Report your blood sugar to the short stay nurse when you get to Short Stay.   If you are admitted to the [facility] after surgery: ? Your blood sugar will be checked by the staff and you will probably be given insulin after surgery (instead of oral diabetes medicines) to make sure you have good blood sugar levels. ? The goal for blood sugar control after surgery is 80-180 mg               Do not wear jewelry, make-up or nail polish.            Do not wear lotions, powders, or perfumes, or deoderant.            Do not shave 48 hours prior to surgery.  Men may shave  face and neck.            Do not bring valuables to the [facility].            [facility] is not responsible for any belongings or valuables.  Contacts, dentures or bridgework may not be worn into surgery.  Leave your suitcase in the car.  After surgery it may be brought to your room.  For patients admitted to the [facility], discharge time will be determined by your treatment team.  Patients discharged the day of surgery will not be allowed to drive home.  [facility]- Preparing For Surgery  Before surgery, you can play an important role. Because skin is not sterile, your skin needs to be as free of germs as possible. You can reduce the number of germs on your skin by washing with CHG (chlorahexidine gluconate) Soap before surgery.  CHG is an antiseptic cleaner which kills germs and bonds with the skin to continue killing germs even after washing.  Please do not use if you have an allergy to CHG or antibacterial soaps. If your skin becomes reddened/irritated stop using the CHG.  Do not shave (including legs and underarms) for at least 48 hours prior to first CHG shower. It is OK to shave your face.  Please follow these instructions carefully.                                                                                                                     1. Shower the NIGHT BEFORE SURGERY and the MORNING OF SURGERY with CHG.   2. If you chose to wash your hair, wash your hair first as usual with your normal shampoo.  3. After you shampoo, rinse your hair and body thoroughly to remove the shampoo.  4. Use CHG as you would any other liquid soap. You can apply CHG directly to the  skin and wash gently with a scrungie or a clean washcloth.   5. Apply the CHG Soap to your body ONLY FROM THE NECK DOWN.  Do not use on open wounds or open sores. Avoid contact with your eyes, ears, mouth and genitals (private parts). Wash Face and genitals (private parts)  with your normal soap.  6. Wash  thoroughly, paying special attention to the area where your surgery will be performed.  7. Thoroughly rinse your body with warm water from the neck down.  8. DO NOT shower/wash with your normal soap after using and rinsing off the CHG Soap.  9. Pat yourself dry with a CLEAN TOWEL.  10. Wear CLEAN PAJAMAS to bed the night before surgery, wear comfortable clothes the morning of surgery  11. Place CLEAN SHEETS on your bed the night of your first shower and DO NOT SLEEP WITH PETS.    Day of Surgery: Do not apply any deodorants/lotions. Please wear clean clothes to the [facility]/surgery center.     Please read over the following fact sheets that you were given.Pain Booklet, Coughing and Deep Breathing and Surgical Site Infection Prevention

## 2017-03-02 NOTE — H&P (Signed)
Subjective:     Patient ID: Dan Lester is a 71 y.o. male.  Follow-up  Returns for follow up chronic wounds buttock with plan for wound excision and local flap closure.   Chronic right buttock ulcer present since 10/2013 following I&D for soft tissue infection by Dr. [name]. Status post debridement and placement Acell with no change wound. Attempted primary closure 4/16 with dehiscence within first few days. Second attempt excision wound and closure with seroma at 6 weeks post op and reopening wound. Patient with diminished sensation over buttocks following back surgery.   Onset second nodular area buttock right similar to inciting events in 2018 to his now chronic wound buttock. US demonstrated complex fluid collection. Progressed to spontaneous drainage of this and now second separate chronic open wound.   Additional evaluation by Dr. [name] completed.  CT and MRI completed 2018, no evidence of osteo on MRI. CT with superficial nonhealing wound over the right gluteal crease measuring approximately 3 x 6 cm. This extends through the subcutaneous fat to abut the surface of the posterior aspect of the right inferior pubic ramus at its junction to the ischial tuberosity.  Review of Systems     Objective:   Physical Exam  Cardiovascular: Normal rate, regular rhythm and normal heart sounds.   Pulmonary/Chest: Effort normal and breath sounds normal.    Right buttock: wound 2 x 2 x 3 cm with serous drainage 100 % granualation visible wound base, epibole wound margins and circumferential undermining of skin up to 2 cm.  No communication with second area located inferior/caudal to this wound. Second open area, lateral to right testicle and right groin crease 64mm opening noted with serous drainage expressed, able to probe to 1 cm depth, no celluluiris  Assessment:  Chronic ulcers buttock s/p excision closure x2 with recurrence     Plan:     Pictures today.  Over 3 years chronic  ulcer, presumed non healing due to absence feeling and pressure. We have reviewed options do nothing- history has demonstrated that he has not healed on own, would have drainage, wound care. Surgical option include excision ulcers with VAC use to aid healing by secondary intention. In past, patient unable to tolerate VAC and removed frequently.  Alternative is excision of ulcer and local flap closure. This would require excision of two adjacent wounds and creating larger single wound. Given he is ambulatory wound plan local fascial flap closure. Discussed post op LAL mattress, no sitting x 1 month, few day hospital stay, need to assist for all meals and transport lying down, no work for 4-6 weeks, drain care. Risk of flap failure or wound healing problems would be wound significantly larger than present. Also we discussed risk DVT in this immobilized state and would plan Lovenox prophylaxis at home for few weeks.  Counseled after 4 weeks could assess for standing, OOB.   Additional risks DVT/PE, cardiopulmonary complications, damage to adjacent structures, failure flap, seroma, hematoma reviewed with patient. Discussed OOB for bathroom only post operative.  Irene Limbo, MD [facility] Plastic & Reconstructive Surgery [phone], pin [phone]

## 2017-03-03 ENCOUNTER — Other Ambulatory Visit: Payer: Self-pay

## 2017-03-03 ENCOUNTER — Encounter (HOSPITAL_COMMUNITY): Payer: Self-pay

## 2017-03-03 ENCOUNTER — Encounter (HOSPITAL_COMMUNITY)
Admission: RE | Admit: 2017-03-03 | Discharge: 2017-03-03 | Disposition: A | Discharge status: Home / Self Care | Payer: 59 | Source: Ambulatory Visit | Attending: Plastic Surgery | Admitting: Plastic Surgery

## 2017-03-03 DIAGNOSIS — E785 Hyperlipidemia, unspecified: Secondary | ICD-10-CM | POA: Insufficient documentation

## 2017-03-03 DIAGNOSIS — Z8546 Personal history of malignant neoplasm of prostate: Secondary | ICD-10-CM | POA: Diagnosis not present

## 2017-03-03 DIAGNOSIS — Z01810 Encounter for preprocedural cardiovascular examination: Secondary | ICD-10-CM | POA: Insufficient documentation

## 2017-03-03 DIAGNOSIS — E119 Type 2 diabetes mellitus without complications: Secondary | ICD-10-CM | POA: Insufficient documentation

## 2017-03-03 DIAGNOSIS — K4090 Unilateral inguinal hernia, without obstruction or gangrene, not specified as recurrent: Secondary | ICD-10-CM | POA: Insufficient documentation

## 2017-03-03 DIAGNOSIS — Z01812 Encounter for preprocedural laboratory examination: Secondary | ICD-10-CM | POA: Insufficient documentation

## 2017-03-03 DIAGNOSIS — J45909 Unspecified asthma, uncomplicated: Secondary | ICD-10-CM | POA: Insufficient documentation

## 2017-03-03 DIAGNOSIS — F329 Major depressive disorder, single episode, unspecified: Secondary | ICD-10-CM | POA: Insufficient documentation

## 2017-03-03 DIAGNOSIS — E039 Hypothyroidism, unspecified: Secondary | ICD-10-CM | POA: Insufficient documentation

## 2017-03-03 DIAGNOSIS — K219 Gastro-esophageal reflux disease without esophagitis: Secondary | ICD-10-CM | POA: Insufficient documentation

## 2017-03-03 DIAGNOSIS — Z87442 Personal history of urinary calculi: Secondary | ICD-10-CM | POA: Insufficient documentation

## 2017-03-03 HISTORY — DX: Personal history of urinary calculi: Z87.442

## 2017-03-03 HISTORY — DX: Hypothyroidism, unspecified: E03.9

## 2017-03-03 HISTORY — DX: Major depressive disorder, single episode, unspecified: F32.9

## 2017-03-03 HISTORY — DX: Depression, unspecified: F32.A

## 2017-03-03 LAB — HEMOGLOBIN A1C
HEMOGLOBIN A1C: 8.8 % — AB (ref 4.8–5.6)
Mean Plasma Glucose: 205.86 mg/dL

## 2017-03-03 LAB — COMPREHENSIVE METABOLIC PANEL
ALT: 21 U/L (ref 17–63)
AST: 23 U/L (ref 15–41)
Albumin: 3.4 g/dL — ABNORMAL LOW (ref 3.5–5.0)
Alkaline Phosphatase: 92 U/L (ref 38–126)
Anion gap: 6 (ref 5–15)
BUN: 22 mg/dL — AB (ref 6–20)
CHLORIDE: 101 mmol/L (ref 101–111)
CO2: 27 mmol/L (ref 22–32)
Calcium: 9.4 mg/dL (ref 8.9–10.3)
Creatinine, Ser: 1.02 mg/dL (ref 0.61–1.24)
Glucose, Bld: 262 mg/dL — ABNORMAL HIGH (ref 65–99)
POTASSIUM: 4.6 mmol/L (ref 3.5–5.1)
SODIUM: 134 mmol/L — AB (ref 135–145)
Total Bilirubin: 0.7 mg/dL (ref 0.3–1.2)
Total Protein: 6.8 g/dL (ref 6.5–8.1)

## 2017-03-03 LAB — CBC WITH DIFFERENTIAL/PLATELET
BASOS ABS: 0 10*3/uL (ref 0.0–0.1)
BASOS PCT: 0 %
EOS ABS: 0.5 10*3/uL (ref 0.0–0.7)
EOS PCT: 6 %
HCT: 40.2 % (ref 39.0–52.0)
Hemoglobin: 13.6 g/dL (ref 13.0–17.0)
LYMPHS PCT: 22 %
Lymphs Abs: 1.7 10*3/uL (ref 0.7–4.0)
MCH: 30.7 pg (ref 26.0–34.0)
MCHC: 33.8 g/dL (ref 30.0–36.0)
MCV: 90.7 fL (ref 78.0–100.0)
MONO ABS: 0.6 10*3/uL (ref 0.1–1.0)
Monocytes Relative: 8 %
Neutro Abs: 4.8 10*3/uL (ref 1.7–7.7)
Neutrophils Relative %: 64 %
PLATELETS: 237 10*3/uL (ref 150–400)
RBC: 4.43 MIL/uL (ref 4.22–5.81)
RDW: 12.7 % (ref 11.5–15.5)
WBC: 7.6 10*3/uL (ref 4.0–10.5)

## 2017-03-03 LAB — PREALBUMIN: PREALBUMIN: 21.9 mg/dL (ref 18–38)

## 2017-03-03 LAB — GLUCOSE, CAPILLARY: Glucose-Capillary: 242 mg/dL — ABNORMAL HIGH (ref 65–99)

## 2017-03-03 NOTE — Progress Notes (Signed)
LEFT MESSAGE FOR ANITA AT DR. Delfino Lovett [facility]'S OFFICE TO FAX ECHO OR ANY CARDIAC STUDIES AVAILABLE.

## 2017-03-10 NOTE — Progress Notes (Addendum)
Anesthesia Chart Review:  Pt is a 71 year old male scheduled for excision of ischial pressure ulcer skin flap closure on 03/22/2017 with Irene Limbo, MD  - PCP is Burnard Bunting, MD  PMH includes:  DM hyperlipidemia, asthma, hypothyroidism, prostate cancer, GERD. Former smoker. BMI 23. S/p complex repair buttocks 08/29/15.   Medications include: Advair, levothyroxine, metformin, Protonix  BP (!) 160/84   Pulse 87   Temp 36.6 C   Resp 20   Ht 6\' 2"  (1.88 m)   Wt 179 lb 3.2 oz (81.3 kg)   SpO2 100%   BMI 23.01 kg/m   Preoperative labs 03/03/17 reviewed.   - HbA1c 8.8, glucose 262  EKG 03/03/17: NSR.   Nuclear stress test 11/10/10 (obtained from PCP's office): 1. Post-stress myocardial perfusion images show normal pattern of perfusion in all regions. Post-stress LV normal in size. No scintigraphic evidence of inducible myocardial ischemia. 2. Post-stress EF 60%. Global LV systolic function normal. No significant wall motion abnormalities. 3. Exercise capacity 9 METS. EKG is negative for ischemia. No ECG changes.  4. Normal myocardial perfusion study. No significant ischemia demonstrated. This is low risk scan.  Case was originally scheduled for 03/07/17 but Dr. Iran Planas postponed due to uncontrolled DM. Pt will need updated labs prior to surgery, but if DM is controlled, I anticipate pt can proceed 03/22/17 as scheduled.   Willeen Cass, FNP-BC [facility] Short Stay Surgical Center/Anesthesiology Phone: [phone] 03/10/2017 6:22 PM

## 2017-03-11 DIAGNOSIS — E119 Type 2 diabetes mellitus without complications: Secondary | ICD-10-CM | POA: Diagnosis not present

## 2017-03-11 DIAGNOSIS — Z6824 Body mass index (BMI) 24.0-24.9, adult: Secondary | ICD-10-CM | POA: Diagnosis not present

## 2017-03-11 DIAGNOSIS — R05 Cough: Secondary | ICD-10-CM | POA: Diagnosis not present

## 2017-03-15 DIAGNOSIS — E119 Type 2 diabetes mellitus without complications: Secondary | ICD-10-CM | POA: Diagnosis not present

## 2017-03-15 DIAGNOSIS — Z6824 Body mass index (BMI) 24.0-24.9, adult: Secondary | ICD-10-CM | POA: Diagnosis not present

## 2017-03-22 ENCOUNTER — Encounter (HOSPITAL_COMMUNITY): Payer: Self-pay | Admitting: *Deleted

## 2017-03-22 ENCOUNTER — Inpatient Hospital Stay (HOSPITAL_COMMUNITY): Payer: 59 | Admitting: Emergency Medicine

## 2017-03-22 ENCOUNTER — Inpatient Hospital Stay (HOSPITAL_COMMUNITY): Payer: 59 | Admitting: Certified Registered Nurse Anesthetist

## 2017-03-22 ENCOUNTER — Encounter (HOSPITAL_COMMUNITY)
Admission: RE | Disposition: A | Discharge status: Home / Self Care | Payer: Self-pay | Source: Home / Self Care | Attending: Plastic Surgery

## 2017-03-22 ENCOUNTER — Inpatient Hospital Stay (HOSPITAL_COMMUNITY)
Admission: RE | Admit: 2017-03-22 | Discharge: 2017-03-24 | DRG: 574 | Disposition: A | Discharge status: Home / Self Care | Payer: 59 | Attending: Plastic Surgery | Admitting: Plastic Surgery

## 2017-03-22 DIAGNOSIS — I96 Gangrene, not elsewhere classified: Secondary | ICD-10-CM | POA: Diagnosis not present

## 2017-03-22 DIAGNOSIS — T8131XA Disruption of external operation (surgical) wound, not elsewhere classified, initial encounter: Secondary | ICD-10-CM | POA: Diagnosis present

## 2017-03-22 DIAGNOSIS — E119 Type 2 diabetes mellitus without complications: Secondary | ICD-10-CM | POA: Diagnosis not present

## 2017-03-22 DIAGNOSIS — Y838 Other surgical procedures as the cause of abnormal reaction of the patient, or of later complication, without mention of misadventure at the time of the procedure: Secondary | ICD-10-CM | POA: Diagnosis present

## 2017-03-22 DIAGNOSIS — Z872 Personal history of diseases of the skin and subcutaneous tissue: Secondary | ICD-10-CM

## 2017-03-22 DIAGNOSIS — L89314 Pressure ulcer of right buttock, stage 4: Secondary | ICD-10-CM | POA: Diagnosis not present

## 2017-03-22 DIAGNOSIS — J45909 Unspecified asthma, uncomplicated: Secondary | ICD-10-CM | POA: Diagnosis not present

## 2017-03-22 DIAGNOSIS — T148XXA Other injury of unspecified body region, initial encounter: Secondary | ICD-10-CM

## 2017-03-22 HISTORY — PX: MASS EXCISION: SHX2000

## 2017-03-22 HISTORY — DX: Personal history of diseases of the skin and subcutaneous tissue: Z87.2

## 2017-03-22 LAB — COMPREHENSIVE METABOLIC PANEL
ALK PHOS: 82 U/L (ref 38–126)
ALT: 19 U/L (ref 17–63)
AST: 20 U/L (ref 15–41)
Albumin: 3.5 g/dL (ref 3.5–5.0)
Anion gap: 9 (ref 5–15)
BUN: 25 mg/dL — AB (ref 6–20)
CALCIUM: 9.2 mg/dL (ref 8.9–10.3)
CHLORIDE: 105 mmol/L (ref 101–111)
CO2: 22 mmol/L (ref 22–32)
CREATININE: 1.14 mg/dL (ref 0.61–1.24)
GFR calc Af Amer: >60 mL/min (ref >60)
Glucose, Bld: 147 mg/dL — ABNORMAL HIGH (ref 65–99)
Potassium: 4 mmol/L (ref 3.5–5.1)
SODIUM: 136 mmol/L (ref 135–145)
Total Bilirubin: 0.8 mg/dL (ref 0.3–1.2)
Total Protein: 7.1 g/dL (ref 6.5–8.1)

## 2017-03-22 LAB — CBC WITH DIFFERENTIAL/PLATELET
BASOS ABS: 0 10*3/uL (ref 0.0–0.1)
Basophils Relative: 0 %
EOS PCT: 4 %
Eosinophils Absolute: 0.4 10*3/uL (ref 0.0–0.7)
HCT: 40 % (ref 39.0–52.0)
HEMOGLOBIN: 13.6 g/dL (ref 13.0–17.0)
LYMPHS ABS: 2.2 10*3/uL (ref 0.7–4.0)
LYMPHS PCT: 25 %
MCH: 30.6 pg (ref 26.0–34.0)
MCHC: 34 g/dL (ref 30.0–36.0)
MCV: 90.1 fL (ref 78.0–100.0)
Monocytes Absolute: 0.7 10*3/uL (ref 0.1–1.0)
Monocytes Relative: 8 %
NEUTROS PCT: 63 %
Neutro Abs: 5.5 10*3/uL (ref 1.7–7.7)
PLATELETS: 262 10*3/uL (ref 150–400)
RBC: 4.44 MIL/uL (ref 4.22–5.81)
RDW: 12.7 % (ref 11.5–15.5)
WBC: 8.7 10*3/uL (ref 4.0–10.5)

## 2017-03-22 LAB — GLUCOSE, CAPILLARY
GLUCOSE-CAPILLARY: 110 mg/dL — AB (ref 65–99)
GLUCOSE-CAPILLARY: 123 mg/dL — AB (ref 65–99)
Glucose-Capillary: 146 mg/dL — ABNORMAL HIGH (ref 65–99)
Glucose-Capillary: 259 mg/dL — ABNORMAL HIGH (ref 65–99)

## 2017-03-22 SURGERY — EXCISION MASS
Anesthesia: General | Site: Buttocks | Laterality: Right

## 2017-03-22 MED ORDER — CEFAZOLIN SODIUM-DEXTROSE 2-4 GM/100ML-% IV SOLN
2.0000 g | INTRAVENOUS | Status: AC
  Administered 2017-03-22: 2 g via INTRAVENOUS
  Filled 2017-03-22: qty 100

## 2017-03-22 MED ORDER — ROCURONIUM BROMIDE 10 MG/ML (PF) SYRINGE
PREFILLED_SYRINGE | INTRAVENOUS | Status: AC
  Filled 2017-03-22: qty 5

## 2017-03-22 MED ORDER — FENTANYL CITRATE (PF) 100 MCG/2ML IJ SOLN
25.0000 ug | INTRAMUSCULAR | Status: DC | PRN
  Administered 2017-03-22 (×2): 25 ug via INTRAVENOUS
  Administered 2017-03-22: 50 ug via INTRAVENOUS

## 2017-03-22 MED ORDER — PREMIER PROTEIN SHAKE
2.0000 [oz_av] | Freq: Four times a day (QID) | ORAL | Status: DC
  Administered 2017-03-22 – 2017-03-24 (×4): 2 [oz_av] via ORAL
  Filled 2017-03-22 (×13): qty 325.31

## 2017-03-22 MED ORDER — LIDOCAINE 2% (20 MG/ML) 5 ML SYRINGE
INTRAMUSCULAR | Status: DC | PRN
  Administered 2017-03-22: 100 mg via INTRAVENOUS

## 2017-03-22 MED ORDER — BACITRACIN ZINC 500 UNIT/GM EX OINT
TOPICAL_OINTMENT | CUTANEOUS | Status: AC
  Filled 2017-03-22: qty 28.35

## 2017-03-22 MED ORDER — EPHEDRINE 5 MG/ML INJ
INTRAVENOUS | Status: AC
  Filled 2017-03-22: qty 10

## 2017-03-22 MED ORDER — PHENYLEPHRINE 40 MCG/ML (10ML) SYRINGE FOR IV PUSH (FOR BLOOD PRESSURE SUPPORT)
PREFILLED_SYRINGE | INTRAVENOUS | Status: DC | PRN
Start: 2017-03-22 — End: 2017-03-22
  Administered 2017-03-22: 80 ug via INTRAVENOUS
  Administered 2017-03-22: 40 ug via INTRAVENOUS
  Administered 2017-03-22: 200 ug via INTRAVENOUS
  Administered 2017-03-22: 80 ug via INTRAVENOUS

## 2017-03-22 MED ORDER — MIDAZOLAM HCL 2 MG/2ML IJ SOLN
INTRAMUSCULAR | Status: AC
  Filled 2017-03-22: qty 2

## 2017-03-22 MED ORDER — FENTANYL CITRATE (PF) 250 MCG/5ML IJ SOLN
INTRAMUSCULAR | Status: AC
  Filled 2017-03-22: qty 5

## 2017-03-22 MED ORDER — ENOXAPARIN SODIUM 40 MG/0.4ML ~~LOC~~ SOLN
40.0000 mg | SUBCUTANEOUS | Status: DC
  Administered 2017-03-23 – 2017-03-24 (×2): 40 mg via SUBCUTANEOUS
  Filled 2017-03-22 (×2): qty 0.4

## 2017-03-22 MED ORDER — PHENOL 1.4 % MT LIQD: 1.0000 | OROMUCOSAL | Status: DC | PRN

## 2017-03-22 MED ORDER — PANTOPRAZOLE SODIUM 40 MG PO TBEC
40.0000 mg | DELAYED_RELEASE_TABLET | Freq: Every day | ORAL | Status: DC
  Administered 2017-03-23 – 2017-03-24 (×2): 40 mg via ORAL
  Filled 2017-03-22 (×2): qty 1

## 2017-03-22 MED ORDER — FENTANYL CITRATE (PF) 100 MCG/2ML IJ SOLN
INTRAMUSCULAR | Status: DC | PRN
Start: 2017-03-22 — End: 2017-03-22
  Administered 2017-03-22: 50 ug via INTRAVENOUS
  Administered 2017-03-22 (×3): 25 ug via INTRAVENOUS
  Administered 2017-03-22: 50 ug via INTRAVENOUS

## 2017-03-22 MED ORDER — ROCURONIUM BROMIDE 10 MG/ML (PF) SYRINGE
PREFILLED_SYRINGE | INTRAVENOUS | Status: DC | PRN
  Administered 2017-03-22: 50 mg via INTRAVENOUS
  Administered 2017-03-22: 20 mg via INTRAVENOUS

## 2017-03-22 MED ORDER — LIDOCAINE-EPINEPHRINE (PF) 1 %-1:200000 IJ SOLN
INTRAMUSCULAR | Status: AC
  Filled 2017-03-22: qty 30

## 2017-03-22 MED ORDER — SUGAMMADEX SODIUM 200 MG/2ML IV SOLN
INTRAVENOUS | Status: DC | PRN
  Administered 2017-03-22: 200 mg via INTRAVENOUS

## 2017-03-22 MED ORDER — CHLORHEXIDINE GLUCONATE CLOTH 2 % EX PADS: 6.0000 | MEDICATED_PAD | Freq: Once | CUTANEOUS | Status: DC

## 2017-03-22 MED ORDER — ONDANSETRON HCL 4 MG/2ML IJ SOLN: 4.0000 mg | Freq: Four times a day (QID) | INTRAMUSCULAR | Status: DC | PRN

## 2017-03-22 MED ORDER — LIDOCAINE 2% (20 MG/ML) 5 ML SYRINGE
INTRAMUSCULAR | Status: AC
  Filled 2017-03-22: qty 5

## 2017-03-22 MED ORDER — LIDOCAINE-EPINEPHRINE 1 %-1:100000 IJ SOLN
INTRAMUSCULAR | Status: AC
  Filled 2017-03-22: qty 1

## 2017-03-22 MED ORDER — ONDANSETRON HCL 4 MG/2ML IJ SOLN
INTRAMUSCULAR | Status: AC
  Filled 2017-03-22: qty 2

## 2017-03-22 MED ORDER — DEXAMETHASONE SODIUM PHOSPHATE 10 MG/ML IJ SOLN
INTRAMUSCULAR | Status: DC | PRN
  Administered 2017-03-22: 5 mg via INTRAVENOUS

## 2017-03-22 MED ORDER — MIDAZOLAM HCL 5 MG/5ML IJ SOLN
INTRAMUSCULAR | Status: DC | PRN
  Administered 2017-03-22: 1 mg via INTRAVENOUS

## 2017-03-22 MED ORDER — PHENYLEPHRINE HCL 10 MG/ML IJ SOLN
INTRAMUSCULAR | Status: DC | PRN
  Administered 2017-03-22: 40 ug/min via INTRAVENOUS

## 2017-03-22 MED ORDER — LEVOTHYROXINE SODIUM 200 MCG PO TABS
200.0000 ug | ORAL_TABLET | Freq: Every day | ORAL | Status: DC
  Administered 2017-03-22 – 2017-03-23 (×2): 200 ug via ORAL
  Filled 2017-03-22: qty 2
  Filled 2017-03-22 (×2): qty 1
  Filled 2017-03-22: qty 2

## 2017-03-22 MED ORDER — DEXAMETHASONE SODIUM PHOSPHATE 10 MG/ML IJ SOLN
INTRAMUSCULAR | Status: AC
  Filled 2017-03-22: qty 1

## 2017-03-22 MED ORDER — INSULIN ASPART 100 UNIT/ML ~~LOC~~ SOLN
0.0000 [IU] | Freq: Three times a day (TID) | SUBCUTANEOUS | Status: DC
  Administered 2017-03-22: 1 [IU] via SUBCUTANEOUS
  Administered 2017-03-23: 5 [IU] via SUBCUTANEOUS
  Administered 2017-03-23: 3 [IU] via SUBCUTANEOUS
  Administered 2017-03-23 – 2017-03-24 (×2): 2 [IU] via SUBCUTANEOUS

## 2017-03-22 MED ORDER — FLUTICASONE PROPIONATE 50 MCG/ACT NA SUSP
2.0000 | Freq: Every day | NASAL | Status: DC
  Administered 2017-03-23 – 2017-03-24 (×2): 2 via NASAL
  Filled 2017-03-22: qty 16

## 2017-03-22 MED ORDER — POTASSIUM CHLORIDE IN NACL 20-0.45 MEQ/L-% IV SOLN
INTRAVENOUS | Status: DC
  Administered 2017-03-22: 18:00:00 via INTRAVENOUS
  Filled 2017-03-22 (×3): qty 1000

## 2017-03-22 MED ORDER — LACTATED RINGERS IV SOLN
INTRAVENOUS | Status: DC
  Administered 2017-03-22 (×2): via INTRAVENOUS

## 2017-03-22 MED ORDER — EPHEDRINE SULFATE-NACL 50-0.9 MG/10ML-% IV SOSY
PREFILLED_SYRINGE | INTRAVENOUS | Status: DC | PRN
  Administered 2017-03-22: 5 mg via INTRAVENOUS

## 2017-03-22 MED ORDER — ONDANSETRON 4 MG PO TBDP: 4.0000 mg | ORAL_TABLET | Freq: Four times a day (QID) | ORAL | Status: DC | PRN

## 2017-03-22 MED ORDER — ADULT MULTIVITAMIN W/MINERALS CH
1.0000 | ORAL_TABLET | Freq: Every day | ORAL | Status: DC
  Administered 2017-03-22 – 2017-03-24 (×3): 1 via ORAL
  Filled 2017-03-22 (×3): qty 1

## 2017-03-22 MED ORDER — ENOXAPARIN (LOVENOX) PATIENT EDUCATION KIT
PACK | Freq: Once | Status: DC
  Filled 2017-03-22 (×2): qty 1

## 2017-03-22 MED ORDER — BUPIVACAINE-EPINEPHRINE (PF) 0.25% -1:200000 IJ SOLN
INTRAMUSCULAR | Status: AC
  Filled 2017-03-22: qty 30

## 2017-03-22 MED ORDER — TRAMADOL HCL 50 MG PO TABS
50.0000 mg | ORAL_TABLET | Freq: Four times a day (QID) | ORAL | Status: DC | PRN
  Administered 2017-03-22 – 2017-03-23 (×2): 50 mg via ORAL
  Filled 2017-03-22 (×2): qty 1

## 2017-03-22 MED ORDER — NABUMETONE 500 MG PO TABS
500.0000 mg | ORAL_TABLET | Freq: Every day | ORAL | Status: DC
  Administered 2017-03-23 – 2017-03-24 (×2): 500 mg via ORAL
  Filled 2017-03-22 (×3): qty 1

## 2017-03-22 MED ORDER — PROPOFOL 10 MG/ML IV BOLUS
INTRAVENOUS | Status: DC | PRN
  Administered 2017-03-22: 180 mg via INTRAVENOUS

## 2017-03-22 MED ORDER — SUGAMMADEX SODIUM 200 MG/2ML IV SOLN
INTRAVENOUS | Status: AC
  Filled 2017-03-22: qty 2

## 2017-03-22 MED ORDER — HYDROCODONE-ACETAMINOPHEN 5-325 MG PO TABS
1.0000 | ORAL_TABLET | ORAL | Status: DC | PRN
  Administered 2017-03-23: 1 via ORAL
  Filled 2017-03-22: qty 2

## 2017-03-22 MED ORDER — MOMETASONE FURO-FORMOTEROL FUM 200-5 MCG/ACT IN AERO
2.0000 | INHALATION_SPRAY | Freq: Two times a day (BID) | RESPIRATORY_TRACT | Status: DC
  Administered 2017-03-22 – 2017-03-24 (×4): 2 via RESPIRATORY_TRACT
  Filled 2017-03-22: qty 8.8

## 2017-03-22 MED ORDER — PHENYLEPHRINE 40 MCG/ML (10ML) SYRINGE FOR IV PUSH (FOR BLOOD PRESSURE SUPPORT)
PREFILLED_SYRINGE | INTRAVENOUS | Status: AC
  Filled 2017-03-22: qty 10

## 2017-03-22 MED ORDER — OXYCODONE HCL 5 MG/5ML PO SOLN: 5.0000 mg | Freq: Once | ORAL | Status: AC | PRN

## 2017-03-22 MED ORDER — BUPROPION HCL ER (XL) 150 MG PO TB24
150.0000 mg | ORAL_TABLET | Freq: Every day | ORAL | Status: DC
  Administered 2017-03-22 – 2017-03-24 (×3): 150 mg via ORAL
  Filled 2017-03-22 (×3): qty 1

## 2017-03-22 MED ORDER — ONDANSETRON HCL 4 MG/2ML IJ SOLN
INTRAMUSCULAR | Status: DC | PRN
  Administered 2017-03-22: 4 mg via INTRAVENOUS

## 2017-03-22 MED ORDER — OXYCODONE HCL 5 MG PO TABS
5.0000 mg | ORAL_TABLET | Freq: Once | ORAL | Status: AC | PRN
  Administered 2017-03-22: 5 mg via ORAL

## 2017-03-22 MED ORDER — HYDROMORPHONE HCL 1 MG/ML IJ SOLN: 0.5000 mg | INTRAMUSCULAR | Status: DC | PRN

## 2017-03-22 MED ORDER — PROPOFOL 10 MG/ML IV BOLUS
INTRAVENOUS | Status: AC
  Filled 2017-03-22: qty 20

## 2017-03-22 MED ORDER — 0.9 % SODIUM CHLORIDE (POUR BTL) OPTIME
TOPICAL | Status: DC | PRN
  Administered 2017-03-22: 1000 mL

## 2017-03-22 SURGICAL SUPPLY — 70 items
BENZOIN TINCTURE PRP APPL 2/3 (GAUZE/BANDAGES/DRESSINGS) IMPLANT
BLADE 10 SAFETY STRL DISP (BLADE) ×3 IMPLANT
BLADE CLIPPER SURG (BLADE) IMPLANT
BLADE SURG 11 STRL SS (BLADE) IMPLANT
BLADE SURG 15 STRL LF DISP TIS (BLADE) ×1 IMPLANT
BLADE SURG 15 STRL SS (BLADE) ×2
CANISTER SUCT 1200ML W/VALVE (MISCELLANEOUS) IMPLANT
CHLORAPREP W/TINT 26ML (MISCELLANEOUS) ×3 IMPLANT
CLOSURE WOUND 1/2 X4 (GAUZE/BANDAGES/DRESSINGS)
CONT SPEC 4OZ CLIKSEAL STRL BL (MISCELLANEOUS) ×3 IMPLANT
COVER BACK TABLE 60X90IN (DRAPES) ×3 IMPLANT
COVER MAYO STAND STRL (DRAPES) ×3 IMPLANT
COVER SURGICAL LIGHT HANDLE (MISCELLANEOUS) ×3 IMPLANT
DERMABOND ADVANCED (GAUZE/BANDAGES/DRESSINGS)
DERMABOND ADVANCED .7 DNX12 (GAUZE/BANDAGES/DRESSINGS) IMPLANT
DRAIN SNY 10 ROU (WOUND CARE) IMPLANT
DRAPE PED LAPAROTOMY (DRAPES) IMPLANT
DRAPE U-SHAPE 76X120 STRL (DRAPES) ×3 IMPLANT
DRESSING PREVENA PLUS CUSTOM (GAUZE/BANDAGES/DRESSINGS) ×1 IMPLANT
DRSG PREVENA PLUS CUSTOM (GAUZE/BANDAGES/DRESSINGS) ×3
ELECT COATED BLADE 2.86 ST (ELECTRODE) ×6 IMPLANT
ELECT NEEDLE BLADE 2-5/6 (NEEDLE) ×3 IMPLANT
ELECT REM PT RETURN 9FT ADLT (ELECTROSURGICAL)
ELECT REM PT RETURN 9FT PED (ELECTROSURGICAL)
ELECTRODE REM PT RETRN 9FT PED (ELECTROSURGICAL) IMPLANT
ELECTRODE REM PT RTRN 9FT ADLT (ELECTROSURGICAL) IMPLANT
EVACUATOR SILICONE 100CC (DRAIN) IMPLANT
GAUZE SPONGE 2X2 8PLY STRL LF (GAUZE/BANDAGES/DRESSINGS) IMPLANT
GAUZE XEROFORM 1X8 LF (GAUZE/BANDAGES/DRESSINGS) IMPLANT
GLOVE BIO SURGEON STRL SZ 6 (GLOVE) ×3 IMPLANT
GLOVE BIO SURGEON STRL SZ 6.5 (GLOVE) ×2 IMPLANT
GLOVE BIO SURGEONS STRL SZ 6.5 (GLOVE) ×1
GLOVE BIOGEL PI IND STRL 6.5 (GLOVE) ×1 IMPLANT
GLOVE BIOGEL PI IND STRL 7.0 (GLOVE) ×1 IMPLANT
GLOVE BIOGEL PI INDICATOR 6.5 (GLOVE) ×2
GLOVE BIOGEL PI INDICATOR 7.0 (GLOVE) ×2
GOWN STRL REUS W/ TWL LRG LVL3 (GOWN DISPOSABLE) ×2 IMPLANT
GOWN STRL REUS W/TWL LRG LVL3 (GOWN DISPOSABLE) ×4
KIT BASIN OR (CUSTOM PROCEDURE TRAY) ×3 IMPLANT
NEEDLE HYPO 30X.5 LL (NEEDLE) IMPLANT
NEEDLE PRECISIONGLIDE 27X1.5 (NEEDLE) ×3 IMPLANT
NS IRRIG 1000ML POUR BTL (IV SOLUTION) IMPLANT
PAD NEG PRESSURE SENSATRAC (MISCELLANEOUS) ×3 IMPLANT
PENCIL BUTTON HOLSTER BLD 10FT (ELECTRODE) ×6 IMPLANT
PIN SAFETY STERILE (MISCELLANEOUS) ×3 IMPLANT
RUBBERBAND STERILE (MISCELLANEOUS) IMPLANT
SHEET MEDIUM DRAPE 40X70 STRL (DRAPES) IMPLANT
SPONGE GAUZE 2X2 STER 10/PKG (GAUZE/BANDAGES/DRESSINGS)
SPONGE GAUZE 4X4 12PLY STER LF (GAUZE/BANDAGES/DRESSINGS) IMPLANT
SPONGE LAP 18X18 X RAY DECT (DISPOSABLE) ×3 IMPLANT
STAPLER VISISTAT 35W (STAPLE) ×3 IMPLANT
STRIP CLOSURE SKIN 1/2X4 (GAUZE/BANDAGES/DRESSINGS) IMPLANT
SUCTION FRAZIER HANDLE 10FR (MISCELLANEOUS)
SUCTION TUBE FRAZIER 10FR DISP (MISCELLANEOUS) IMPLANT
SUT ETHILON 2 0 FS 18 (SUTURE) ×3 IMPLANT
SUT ETHILON 4 0 PS 2 18 (SUTURE) IMPLANT
SUT MNCRL AB 3-0 PS2 18 (SUTURE) ×6 IMPLANT
SUT MNCRL AB 4-0 PS2 18 (SUTURE) IMPLANT
SUT MON AB 5-0 P3 18 (SUTURE) IMPLANT
SUT PDS AB 2-0 CT2 27 (SUTURE) ×6 IMPLANT
SUT PLAIN 5 0 P 3 18 (SUTURE) IMPLANT
SUT PROLENE 5 0 P 3 (SUTURE) IMPLANT
SUT PROLENE 6 0 P 1 18 (SUTURE) IMPLANT
SUT VICRYL 4-0 PS2 18IN ABS (SUTURE) IMPLANT
SYR BULB 3OZ (MISCELLANEOUS) IMPLANT
SYR CONTROL 10ML LL (SYRINGE) IMPLANT
TOWEL OR 17X24 6PK STRL BLUE (TOWEL DISPOSABLE) ×3 IMPLANT
TRAY ENT MC OR (CUSTOM PROCEDURE TRAY) ×3 IMPLANT
TUBE CONNECTING 20'X1/4 (TUBING)
TUBE CONNECTING 20X1/4 (TUBING) IMPLANT

## 2017-03-22 NOTE — Anesthesia Preprocedure Evaluation (Signed)
Anesthesia Evaluation  Patient identified by MRN, date of birth, ID band Patient awake    Reviewed: Allergy & Precautions, NPO status   History of Anesthesia Complications Negative for: history of anesthetic complications  Airway Mallampati: II  TM Distance: <3 FB Neck ROM: Full    Dental  (+) Partial Upper, Partial Lower, Dental Advisory Given   Pulmonary asthma , former smoker,    breath sounds clear to auscultation       Cardiovascular negative cardio ROS   Rhythm:Regular Rate:Normal     Neuro/Psych    GI/Hepatic Neg liver ROS, GERD  ,  Endo/Other  diabetes  Renal/GU negative Renal ROS     Musculoskeletal  (+) Arthritis ,   Abdominal   Peds  Hematology   Anesthesia Other Findings   Reproductive/Obstetrics                             Anesthesia Physical Anesthesia Plan  ASA: III  Anesthesia Plan: General   Post-op Pain Management:    Induction: Intravenous  PONV Risk Score and Plan: 3 and Ondansetron, Dexamethasone and Midazolam  Airway Management Planned: Oral ETT  Additional Equipment:   Intra-op Plan:   Post-operative Plan: Extubation in OR  Informed Consent: I have reviewed the patients History and Physical, chart, labs and discussed the procedure including the risks, benefits and alternatives for the proposed anesthesia with the patient or authorized representative who has indicated his/her understanding and acceptance.   Dental advisory given  Plan Discussed with: CRNA  Anesthesia Plan Comments:         Anesthesia Quick Evaluation

## 2017-03-22 NOTE — Anesthesia Procedure Notes (Signed)
Procedure Name: Intubation Date/Time: 03/22/2017 12:03 PM Performed by: Colin Benton, CRNA Pre-anesthesia Checklist: Emergency Drugs available, Patient identified, Suction available and Patient being monitored Patient Re-evaluated:Patient Re-evaluated prior to induction Oxygen Delivery Method: Circle system utilized Preoxygenation: Pre-oxygenation with 100% oxygen Induction Type: IV induction Ventilation: Mask ventilation without difficulty and Oral airway inserted - appropriate to patient size Laryngoscope Size: Sabra Heck and 2 Grade View: Grade II Tube type: Oral Tube size: 7.5 mm Number of attempts: 1 Airway Equipment and Method: Stylet Placement Confirmation: ETT inserted through vocal cords under direct vision,  positive ETCO2 and breath sounds checked- equal and bilateral Secured at: 24 cm Tube secured with: Tape Dental Injury: Teeth and Oropharynx as per pre-operative assessment

## 2017-03-22 NOTE — Progress Notes (Signed)
Patient arrived to 6n25, alert and oriented, mild pain to surgical area. VSS, IV fluids running, patient noted to have wound vac to right buttocks that is intact and on, and a JP drain to right hip area. Patient informed he is on bedrest and HOB cannot be higher than 30 degrees. Oriented to room and staff, will continue to monitor.

## 2017-03-22 NOTE — Transfer of Care (Signed)
Immediate Anesthesia Transfer of Care Note  Patient: Dan Lester  Procedure(s) Performed: EXCISION OF ISCHIAL PRESSURE  ULCER SKIN FLAP CLOSURE (Right Buttocks)  Patient Location: PACU  Anesthesia Type:General  Level of Consciousness: oriented, drowsy and patient cooperative  Airway & Oxygen Therapy: Patient Spontanous Breathing and Patient connected to face mask oxygen  Post-op Assessment: Report given to RN, Post -op Vital signs reviewed and stable and Patient moving all extremities X 4  Post vital signs: Reviewed and stable  Last Vitals:  Vitals:   03/22/17 1005 03/22/17 1413  BP: (!) 140/91 (!) 163/83  Pulse: 95 76  Resp: 18 11  Temp: 37 C   SpO2: 98% 100%    Last Pain:  Vitals:   03/22/17 1005  TempSrc: Oral         Complications: No apparent anesthesia complications

## 2017-03-22 NOTE — Interval H&P Note (Signed)
History and Physical Interval Note:  03/22/2017 10:48 AM  [name]  has presented today for surgery, with the diagnosis of STAGE 4 PRESSURE ULCER RIGHT BUTTOCKS  The various methods of treatment have been discussed with the patient and family. After consideration of risks, benefits and other options for treatment, the patient has consented to  Procedure(s): EXCISION OF ISCHIAL PRESSURE  ULCER SKIN FLAP CLOSURE (N/A) , RIGHT as a surgical intervention .  The patient's history has been reviewed, patient examined, no change in status, stable for surgery.  I have reviewed the patient's chart and labs.  Questions were answered to the patient's satisfaction.    Patient's surgery delayed to allow for management diabetes, patient had stopped his medications. He has now resumed metformin and started on insulin.  Irene Limbo, MD [facility] Plastic & Reconstructive Surgery [phone], pin [phone]

## 2017-03-22 NOTE — Op Note (Signed)
Operative Note   DATE OF OPERATION: 11.27.18  LOCATION: [facility]-inpatient  SURGICAL DIVISION: Plastic Surgery  PREOPERATIVE DIAGNOSES:  1. Stage IV pressure ulcer right buttock, ischium  POSTOPERATIVE DIAGNOSES:  same  PROCEDURE:  1. Excision right ischial ulcer in preparation for flap 2. Fasciocutaneous flap closure right ischial ulcer 3. Complex closure right medial thigh wound 1 cm  SURGEON: Irene Limbo MD MBA  ASSISTANT: none  ANESTHESIA:  General.   EBL: 50 ml  COMPLICATIONS: None immediate.   INDICATIONS FOR PROCEDURE:  The patient, Dan Lester, is a 71 y.o. male born on 11-26-1945, is here for excision and flap closure right ischial ulcer present for several years that has failed local wound care and primary closure. He is asensate in area following spinal surgery. He is ambulatory.    FINDINGS: Two wounds right thigh and buttock. Ischial wound stage IV without exposure bone, following excision ulcer 3 x 5 cm x 4 cm with undermining laterally to 2 cm. Medial thigh wound 0.5 cm length with 0.5 cm depth and hypergranulation tissue present.  DESCRIPTION OF PROCEDURE:  The patient's operative site was marked with the patient in the preoperative area. The patient was taken to the operating room. SCDs were placed and IV antibiotics were given. Following induction, patient placed in prone position. The patient's operative site was prepped and draped in a sterile fashion. A time out was performed and all information was confirmed to be correct. Sharp excision of skin margins ischial ulcer completed. Entirety of cavity excised, as noted in findings ulcer base is soft tissue over ischium without exposure bone. Laterally based V-Y fasciocutaneous flap designed adjacent to wound with caudal border flap within buttock crease. Incision made sharply through skin and carried through superficial fascia to inferior gluteal muscle. Cavity irrigated and hemostasis obtained. 15 Fr JP  placed in cavity and secured with 2-0 nylon. Flap advanced medially and inset with interrupted 2-0 PDS in superficial fascia. Donor site closed primarily with 2-0 PDS in superficial fascia. Interrupted 3-0 monocryl placed in dermis. Skin closure completed with short running 3-0 monocryl.  The second wound located caudal and medial to primary wound was noted to be superficial in nature with hypergranulation tissue, not contiguous with larger wound. This was sharply excised and hypergranulation tissue excised. Closure completed with 3-0 monocryl in dermis and running 3-0 monocryl for skin closure, length 1 cm.  Preveena VAC applied over flap, donor site and thigh closure, set to 125 mm Hg continuous. Patient returned to supine position. The patient was allowed to wake from anesthesia, extubated and taken to the recovery room in satisfactory condition.   SPECIMENS: right ischial ulcer  DRAINS: 15 Fr JP in right buttock, Preveena VAC  Irene Limbo, MD [facility] Plastic & Reconstructive Surgery [phone], pin [phone]

## 2017-03-22 NOTE — Anesthesia Postprocedure Evaluation (Signed)
Anesthesia Post Note  Patient: Dan Lester  Procedure(s) Performed: EXCISION OF ISCHIAL PRESSURE  ULCER SKIN FLAP CLOSURE (Right Buttocks)     Patient location during evaluation: PACU Anesthesia Type: General Level of consciousness: awake and alert Pain management: pain level controlled Vital Signs Assessment: post-procedure vital signs reviewed and stable Respiratory status: spontaneous breathing, nonlabored ventilation, respiratory function stable and patient connected to nasal cannula oxygen Cardiovascular status: blood pressure returned to baseline and stable Postop Assessment: no apparent nausea or vomiting Anesthetic complications: no    Last Vitals:  Vitals:   03/22/17 1600 03/22/17 1630  BP: 138/76 (!) 148/83  Pulse: 77 81  Resp: 16 16  Temp: 36.9 C 36.9 C  SpO2: 97% 99%    Last Pain:  Vitals:   03/22/17 1630  TempSrc: Oral  PainSc:                  Alacia Rehmann,JAMES TERRILL

## 2017-03-23 ENCOUNTER — Encounter (HOSPITAL_COMMUNITY): Payer: Self-pay | Admitting: Plastic Surgery

## 2017-03-23 ENCOUNTER — Other Ambulatory Visit: Payer: Self-pay

## 2017-03-23 HISTORY — PX: OTHER SURGICAL HISTORY: SHX169

## 2017-03-23 LAB — GLUCOSE, CAPILLARY
GLUCOSE-CAPILLARY: 210 mg/dL — AB (ref 65–99)
GLUCOSE-CAPILLARY: 126 mg/dL — AB (ref 65–99)
Glucose-Capillary: 174 mg/dL — ABNORMAL HIGH (ref 65–99)
Glucose-Capillary: 159 mg/dL — ABNORMAL HIGH (ref 65–99)

## 2017-03-23 MED ORDER — SENNOSIDES-DOCUSATE SODIUM 8.6-50 MG PO TABS: 1.0000 | ORAL_TABLET | Freq: Every evening | ORAL | Status: DC | PRN

## 2017-03-23 MED ORDER — METFORMIN HCL 500 MG PO TABS
500.0000 mg | ORAL_TABLET | Freq: Two times a day (BID) | ORAL | Status: DC
  Administered 2017-03-23 – 2017-03-24 (×3): 500 mg via ORAL
  Filled 2017-03-23 (×3): qty 1

## 2017-03-23 MED ORDER — POLYETHYLENE GLYCOL 3350 17 G PO PACK: 17.0000 g | PACK | Freq: Every day | ORAL | Status: DC | PRN

## 2017-03-23 NOTE — Care Management Note (Signed)
Case Management Note  Patient Details  Name: Dan Lester MRN: [id number] Date of Birth: 02/16/1946  Subjective/Objective:                    Action/Plan:  Spoke to patient at bedside. Discussed DME needs. NCM ordered hospital bed with low air loss mattress and Trapze  bar .  Confirmed home address with patient . Contact person for delivery will be wife Joy [phone]. Patient already has bedside commode at home. Discussed ambulance transport home. Patient in agreement. Patient believes discharge may be tomorrow or Friday. Explained NCM will order DME today to have it delivered before patient discharges home.   Paged MD for signature on orders and progress note for bed. Once obtained [place] will begin to process DME. Expected Discharge Date:                  Expected Discharge Plan:  Home/Self Care  In-House Referral:     Discharge planning Services  CM Consult  Post Acute Care Choice:  Durable Medical Equipment Choice offered to:  Patient  DME Arranged:  Hospital bed, Specialty mattress, Trapeze DME Agency:  [place]:  NA [place] Agency:     Status of Service:  In process, will continue to follow  If discussed at Long Length of Stay Meetings, dates discussed:    Additional Comments:  Marilu Favre, RN 03/23/2017, 10:08 AM

## 2017-03-23 NOTE — Progress Notes (Signed)
  POD#1 excision ischial ulcer, fasciocutaneous flap closure  Temp:  [97.4 F (36.3 C)-98.6 F (37 C)] 98.4 F (36.9 C) (11/28 0449) Pulse Rate:  [73-95] 74 (11/28 0449) Resp:  [8-18] 16 (11/28 0449) BP: (123-163)/(67-91) 126/69 (11/28 0449) SpO2:  [95 %-100 %] 97 % (11/28 0449) Weight:  [81.3 kg (179 lb 3 oz)-81.3 kg (179 lb 3.2 oz)] 81.3 kg (179 lb 3 oz) (11/27 1007)   JP 45 ml PO 560  BS 259/123/110  PE JP serosanguinous Preveena in place  A/P  Patient asensate following spine surgery in area of wound/flap- plan no sitting for appr 4 weeks with exception may use bedside commode. Ok to be on sides and abdomen.  Switch to LAL mattress Need CM for assessment for low air loss mattress or onlay for home. Wife feels needs overhead bar as well for turning. May require stretcher transport for home. Will need home Lovenox, teaching. Resume home metformin with ISS Home when home needs arranged.  Irene Limbo, MD [facility] Plastic & Reconstructive Surgery [phone], pin [phone]

## 2017-03-23 NOTE — Progress Notes (Signed)
Inpatient Diabetes Program Recommendations  AACE/ADA: New Consensus Statement on Inpatient Glycemic Control (2015)  Target Ranges:  Prepandial:   less than 140 mg/dL      Peak postprandial:   less than 180 mg/dL (1-2 hours)      Critically ill patients:  140 - 180 mg/dL   Lab Results  Component Value Date   GLUCAP 210 (H) 03/23/2017   HGBA1C 8.8 (H) 03/03/2017    Review of Glycemic ControlResults for Dan Lester, Dan Lester (MRN [id number]) as of 03/23/2017 13:21  Ref. Range 03/22/2017 10:09 03/22/2017 14:18 03/22/2017 16:32 03/22/2017 20:54 03/23/2017 08:05  Glucose-Capillary Latest Ref Range: 65 - 99 mg/dL 146 (H) 110 (H) 123 (H) 259 (H) 210 (H)    Diabetes history: Type 2 DM Outpatient Diabetes medications: Lantus 10 units daily, Metformin 500 mg bid Current orders for Inpatient glycemic control:  Novolog moderate tid with meals, Metformin 500 mg bid  Inpatient Diabetes Program Recommendations:    May consider restarting home dose of Lantus 10 units daily.   Thanks, Adah Perl, RN, BC-ADM Inpatient Diabetes Coordinator Pager [phone] (8a-5p)

## 2017-03-23 NOTE — Care Management (Signed)
    Durable Medical Equipment  (From admission, onward)        Start     Ordered   03/23/17 0902  For home use only DME 3 n 1  Once     03/23/17 0902   03/23/17 0859  For home use only DME Hospital bed  Once    Comments:  Patient asensate following spine surgery in area of wound/flap- plan no sitting for appr 4 weeks with exception may use bedside commode. Ok to be on sides and abdomen.  Switch to LAL mattress  Question Answer Comment  Patient has (list medical condition): STAGE 4 PRESSURE ULCER RIGHT BUTTOCKS   The above medical condition requires: Patient requires the ability to reposition frequently   Head must be elevated greater than: 45 degrees   Bed type Semi-electric   Trapeze Bar Yes   Support Surface: Low Air loss Mattress      03/23/17 0900

## 2017-03-24 LAB — GLUCOSE, CAPILLARY
GLUCOSE-CAPILLARY: 141 mg/dL — AB (ref 65–99)
Glucose-Capillary: 163 mg/dL — ABNORMAL HIGH (ref 65–99)

## 2017-03-24 MED ORDER — HYDROCODONE-ACETAMINOPHEN 5-325 MG PO TABS: 1.0000 | ORAL_TABLET | ORAL | 0 refills | Status: DC | PRN

## 2017-03-24 MED ORDER — ENOXAPARIN SODIUM 40 MG/0.4ML ~~LOC~~ SOLN: 40.0000 mg | SUBCUTANEOUS | 1 refills | Status: DC

## 2017-03-24 NOTE — Discharge Summary (Signed)
Physician Discharge Summary  Patient ID: Dan Lester MRN: [id number] DOB/AGE: 1945-08-10 71 y.o.  Admit date: 03/22/2017 Discharge date: 03/24/2017  Admission Diagnoses: Stage 4 pressure ulcer ischium right  Discharge Diagnoses:  Active Problems:   Stage 4 pressure ulcer ([place])  Discharged Condition: stable  Hospital Course: Post operatively patient transitioned to low air loss mattress, maintained on bedrest. LAL mattress arranged for home use. Plan no sitting and bed rest with exception bedside commode for at least 4 weeks. Patient instructed on home Lovenox use for DVT prophylaxis during this time. Pain controlled with oral medication.   Treatments: surgery: excision pressure ulcer right ischium with fasciocutaneous flap closure 11.27.18  Discharge Exam: Blood pressure 120/67, pulse 75, temperature 97.7 F (36.5 C), resp. rate 17, height 6\' 2"  (1.88 m), weight 81.3 kg (179 lb 3 oz), SpO2 95 %. Incision/Wound: VAC incisional in place, no drainage in cannister, buttock soft, JP drain serosanguinous  Disposition: 01-Home or Self Care  Discharge Instructions    Call MD for:  redness, tenderness, or signs of infection (pain, swelling, bleeding, redness, odor or green/yellow discharge around incision site)   Complete by:  As directed    Call MD for:  temperature >100.5   Complete by:  As directed    Discharge instructions   Complete by:  As directed    Sponge bathe until follow up. Strip and record JP drain twice daily and bring to clinic visit.  OOB for bedside commode only. No sitting upright in bed, ok to lay on sides and abdomen. Limit HOM elevation to 30 degrees.  Preevena VAC to 125 mm Hg continuous, do not turn off or clamp.  Recommend miralax as needed for constipation. Ok to take ibuprofen as directed for pain control.   Driving Restrictions   Complete by:  As directed    No driving   Resume previous diet   Complete by:  As directed      Allergies as of  03/24/2017   No Known Allergies     Medication List    TAKE these medications   buPROPion 150 MG 24 hr tablet Commonly known as:  WELLBUTRIN XL Take 150 mg daily by mouth.   enoxaparin 40 MG/0.4ML injection Commonly known as:  LOVENOX Inject 0.4 mLs (40 mg total) into the skin daily.   fluticasone 50 MCG/ACT nasal spray Commonly known as:  FLONASE Place 2 sprays daily into both nostrils.   Fluticasone-Salmeterol 250-50 MCG/DOSE Aepb Commonly known as:  ADVAIR Inhale 1 puff daily as needed into the lungs (shortness of breath).   HYDROcodone-acetaminophen 5-325 MG tablet Commonly known as:  NORCO Take 1 tablet by mouth every 4 (four) hours as needed for moderate pain.   ibuprofen 200 MG tablet Commonly known as:  ADVIL,MOTRIN Take 800 mg 2 (two) times daily as needed by mouth for moderate pain.   insulin glargine 100 UNIT/ML injection Commonly known as:  LANTUS Inject 10 Units into the skin every morning.   MELATONIN PO Take 1 tablet at bedtime as needed by mouth (sleep).   metFORMIN 500 MG tablet Commonly known as:  GLUCOPHAGE Take 500 mg 2 (two) times daily with a meal by mouth.   multivitamin with minerals Tabs tablet Take 1 tablet daily by mouth.   nabumetone 500 MG tablet Commonly known as:  RELAFEN Take 500 mg daily by mouth.   pantoprazole 40 MG tablet Commonly known as:  PROTONIX Take 40 mg daily by mouth.   SUPER B COMPLEX/C Caps Take  1 tablet daily by mouth.   SYNTHROID 200 MCG tablet Generic drug:  levothyroxine Take 200 mcg every evening by mouth.            Durable Medical Equipment  (From admission, onward)        Start     Ordered   03/23/17 0859  For home use only DME Hospital bed  Once    Comments:  Patient asensate following spine surgery in area of wound/flap- plan no sitting for appr 4 weeks with exception may use bedside commode. Ok to be on sides and abdomen.  Switch to LAL mattress  Question Answer Comment  Patient has  (list medical condition): STAGE 4 PRESSURE ULCER RIGHT BUTTOCKS   The above medical condition requires: Patient requires the ability to reposition frequently   Head must be elevated greater than: 45 degrees   Bed type Semi-electric   Trapeze Bar Yes   Support Surface: Low Air loss Mattress      03/23/17 0900     Follow-up Information    Irene Limbo, MD Follow up on 03/30/2017.   Specialty:  Plastic Surgery Why:  1130 am Contact information: [address] [place] [address] [phone]           Signed: Irene Limbo 03/24/2017, 7:36 AM

## 2017-03-24 NOTE — Progress Notes (Signed)
1250 Patient provided discharge instructions. Patient states understanding of discharge instructions: wound vac care, JP drain care, Lovenox injections, and follow up appointment with MD. Pateint transported by [facility].

## 2017-03-24 NOTE — Care Management (Addendum)
Dan Lester called all DME is in place and ready at home. Checked with nurse , patient ready for discharge . Called [facility] estimated [facility] will be here is " within the hour". Patient, nurse and Dan Lester aware . [name] RN BSN [phone]     Spoke to patient and wife at bedside. Hospital bed , mattress and trapeze  bar to be delivered today between 1100 and 1500. Patient's wife Dan Lester has NCM direct cell phone number, when DME arrives NCM will call [facility] for transport home.   [name] RN BSN [phone]

## 2017-04-23 DIAGNOSIS — L89314 Pressure ulcer of right buttock, stage 4: Secondary | ICD-10-CM | POA: Diagnosis not present

## 2017-05-30 DIAGNOSIS — E119 Type 2 diabetes mellitus without complications: Secondary | ICD-10-CM | POA: Diagnosis not present

## 2017-05-30 DIAGNOSIS — R030 Elevated blood-pressure reading, without diagnosis of hypertension: Secondary | ICD-10-CM | POA: Diagnosis not present

## 2017-05-30 DIAGNOSIS — E038 Other specified hypothyroidism: Secondary | ICD-10-CM | POA: Diagnosis not present

## 2017-05-30 DIAGNOSIS — Z1389 Encounter for screening for other disorder: Secondary | ICD-10-CM | POA: Diagnosis not present

## 2017-09-27 ENCOUNTER — Inpatient Hospital Stay (HOSPITAL_COMMUNITY): Payer: 59

## 2017-09-27 ENCOUNTER — Encounter (HOSPITAL_COMMUNITY): Payer: Self-pay

## 2017-09-27 ENCOUNTER — Inpatient Hospital Stay (HOSPITAL_COMMUNITY)
Admission: EM | Admit: 2017-09-27 | Discharge: 2017-10-04 | DRG: 234 | Disposition: A | Discharge status: Home / Self Care | Payer: 59 | Attending: Cardiothoracic Surgery | Admitting: Cardiothoracic Surgery

## 2017-09-27 ENCOUNTER — Emergency Department (HOSPITAL_COMMUNITY): Payer: 59

## 2017-09-27 ENCOUNTER — Other Ambulatory Visit: Payer: Self-pay

## 2017-09-27 ENCOUNTER — Inpatient Hospital Stay (HOSPITAL_COMMUNITY)
Admission: EM | Disposition: A | Discharge status: Home / Self Care | Payer: Self-pay | Source: Home / Self Care | Attending: Cardiothoracic Surgery

## 2017-09-27 ENCOUNTER — Encounter (HOSPITAL_COMMUNITY): Payer: Self-pay | Admitting: General Practice

## 2017-09-27 DIAGNOSIS — I9789 Other postprocedural complications and disorders of the circulatory system, not elsewhere classified: Secondary | ICD-10-CM

## 2017-09-27 DIAGNOSIS — I110 Hypertensive heart disease with heart failure: Secondary | ICD-10-CM | POA: Diagnosis not present

## 2017-09-27 DIAGNOSIS — E890 Postprocedural hypothyroidism: Secondary | ICD-10-CM | POA: Diagnosis present

## 2017-09-27 DIAGNOSIS — Z79899 Other long term (current) drug therapy: Secondary | ICD-10-CM | POA: Diagnosis not present

## 2017-09-27 DIAGNOSIS — K219 Gastro-esophageal reflux disease without esophagitis: Secondary | ICD-10-CM | POA: Diagnosis not present

## 2017-09-27 DIAGNOSIS — I97190 Other postprocedural cardiac functional disturbances following cardiac surgery: Secondary | ICD-10-CM | POA: Diagnosis not present

## 2017-09-27 DIAGNOSIS — Z7951 Long term (current) use of inhaled steroids: Secondary | ICD-10-CM

## 2017-09-27 DIAGNOSIS — E785 Hyperlipidemia, unspecified: Secondary | ICD-10-CM | POA: Diagnosis not present

## 2017-09-27 DIAGNOSIS — Z87442 Personal history of urinary calculi: Secondary | ICD-10-CM

## 2017-09-27 DIAGNOSIS — I440 Atrioventricular block, first degree: Secondary | ICD-10-CM | POA: Diagnosis not present

## 2017-09-27 DIAGNOSIS — I2510 Atherosclerotic heart disease of native coronary artery without angina pectoris: Secondary | ICD-10-CM | POA: Diagnosis not present

## 2017-09-27 DIAGNOSIS — Z419 Encounter for procedure for purposes other than remedying health state, unspecified: Secondary | ICD-10-CM

## 2017-09-27 DIAGNOSIS — Z794 Long term (current) use of insulin: Secondary | ICD-10-CM | POA: Diagnosis not present

## 2017-09-27 DIAGNOSIS — R0789 Other chest pain: Secondary | ICD-10-CM | POA: Diagnosis not present

## 2017-09-27 DIAGNOSIS — E119 Type 2 diabetes mellitus without complications: Secondary | ICD-10-CM | POA: Diagnosis present

## 2017-09-27 DIAGNOSIS — I214 Non-ST elevation (NSTEMI) myocardial infarction: Secondary | ICD-10-CM | POA: Diagnosis not present

## 2017-09-27 DIAGNOSIS — I5032 Chronic diastolic (congestive) heart failure: Secondary | ICD-10-CM | POA: Diagnosis present

## 2017-09-27 DIAGNOSIS — Z9049 Acquired absence of other specified parts of digestive tract: Secondary | ICD-10-CM | POA: Diagnosis not present

## 2017-09-27 DIAGNOSIS — I25110 Atherosclerotic heart disease of native coronary artery with unstable angina pectoris: Secondary | ICD-10-CM | POA: Diagnosis not present

## 2017-09-27 DIAGNOSIS — Z09 Encounter for follow-up examination after completed treatment for conditions other than malignant neoplasm: Secondary | ICD-10-CM

## 2017-09-27 DIAGNOSIS — Z8546 Personal history of malignant neoplasm of prostate: Secondary | ICD-10-CM | POA: Diagnosis not present

## 2017-09-27 DIAGNOSIS — Z87891 Personal history of nicotine dependence: Secondary | ICD-10-CM | POA: Diagnosis not present

## 2017-09-27 DIAGNOSIS — I4891 Unspecified atrial fibrillation: Secondary | ICD-10-CM

## 2017-09-27 DIAGNOSIS — R079 Chest pain, unspecified: Secondary | ICD-10-CM | POA: Diagnosis not present

## 2017-09-27 DIAGNOSIS — Z8249 Family history of ischemic heart disease and other diseases of the circulatory system: Secondary | ICD-10-CM | POA: Diagnosis not present

## 2017-09-27 DIAGNOSIS — Z01810 Encounter for preprocedural cardiovascular examination: Secondary | ICD-10-CM | POA: Diagnosis not present

## 2017-09-27 DIAGNOSIS — J90 Pleural effusion, not elsewhere classified: Secondary | ICD-10-CM | POA: Diagnosis not present

## 2017-09-27 DIAGNOSIS — I249 Acute ischemic heart disease, unspecified: Secondary | ICD-10-CM | POA: Diagnosis present

## 2017-09-27 DIAGNOSIS — Z951 Presence of aortocoronary bypass graft: Secondary | ICD-10-CM

## 2017-09-27 DIAGNOSIS — F329 Major depressive disorder, single episode, unspecified: Secondary | ICD-10-CM | POA: Diagnosis present

## 2017-09-27 DIAGNOSIS — I493 Ventricular premature depolarization: Secondary | ICD-10-CM | POA: Diagnosis not present

## 2017-09-27 DIAGNOSIS — J9811 Atelectasis: Secondary | ICD-10-CM | POA: Diagnosis not present

## 2017-09-27 DIAGNOSIS — I4892 Unspecified atrial flutter: Secondary | ICD-10-CM | POA: Diagnosis not present

## 2017-09-27 DIAGNOSIS — Z79890 Hormone replacement therapy (postmenopausal): Secondary | ICD-10-CM

## 2017-09-27 DIAGNOSIS — I371 Nonrheumatic pulmonary valve insufficiency: Secondary | ICD-10-CM | POA: Diagnosis not present

## 2017-09-27 DIAGNOSIS — R0902 Hypoxemia: Secondary | ICD-10-CM | POA: Diagnosis not present

## 2017-09-27 DIAGNOSIS — I081 Rheumatic disorders of both mitral and tricuspid valves: Secondary | ICD-10-CM | POA: Diagnosis not present

## 2017-09-27 HISTORY — PX: LEFT HEART CATH AND CORONARY ANGIOGRAPHY: CATH118249

## 2017-09-27 LAB — GLUCOSE, CAPILLARY: Glucose-Capillary: 139 mg/dL — ABNORMAL HIGH (ref 65–99)

## 2017-09-27 LAB — PROTIME-INR
INR: 1.07
PROTHROMBIN TIME: 13.8 s (ref 11.4–15.2)

## 2017-09-27 LAB — BASIC METABOLIC PANEL
Anion gap: 9 (ref 5–15)
BUN: 26 mg/dL — AB (ref 6–20)
CO2: 28 mmol/L (ref 22–32)
CREATININE: 1.21 mg/dL (ref 0.61–1.24)
Calcium: 9.4 mg/dL (ref 8.9–10.3)
Chloride: 103 mmol/L (ref 101–111)
GFR calc Af Amer: >60 mL/min (ref >60)
GFR, EST NON AFRICAN AMERICAN: 59 mL/min — AB (ref >60)
GLUCOSE: 139 mg/dL — AB (ref 65–99)
POTASSIUM: 3.8 mmol/L (ref 3.5–5.1)
Sodium: 140 mmol/L (ref 135–145)

## 2017-09-27 LAB — I-STAT TROPONIN, ED: Troponin i, poc: 1.27 ng/mL (ref 0.00–0.08)

## 2017-09-27 LAB — HIV ANTIBODY (ROUTINE TESTING W REFLEX): HIV Screen 4th Generation wRfx: NONREACTIVE

## 2017-09-27 LAB — ECHOCARDIOGRAM COMPLETE
HEIGHTINCHES: 74 in
WEIGHTICAEL: 2878.33 [oz_av]

## 2017-09-27 LAB — T4, FREE: Free T4: 1.88 ng/dL — ABNORMAL HIGH (ref 0.82–1.77)

## 2017-09-27 LAB — CBC
HEMATOCRIT: 41.9 % (ref 39.0–52.0)
Hemoglobin: 14.3 g/dL (ref 13.0–17.0)
MCH: 31.2 pg (ref 26.0–34.0)
MCHC: 34.1 g/dL (ref 30.0–36.0)
MCV: 91.5 fL (ref 78.0–100.0)
Platelets: 225 10*3/uL (ref 150–400)
RBC: 4.58 MIL/uL (ref 4.22–5.81)
RDW: 11.9 % (ref 11.5–15.5)
WBC: 7.7 10*3/uL (ref 4.0–10.5)

## 2017-09-27 LAB — HEPARIN LEVEL (UNFRACTIONATED): Heparin Unfractionated: 0.55 IU/mL (ref 0.30–0.70)

## 2017-09-27 LAB — TROPONIN I
Troponin I: 2.49 ng/mL (ref <0.03)
Troponin I: 4.48 ng/mL (ref <0.03)
Troponin I: 6.25 ng/mL (ref <0.03)

## 2017-09-27 LAB — MRSA PCR SCREENING: MRSA BY PCR: NEGATIVE

## 2017-09-27 LAB — TSH: TSH: 0.08 u[IU]/mL — ABNORMAL LOW (ref 0.350–4.500)

## 2017-09-27 SURGERY — LEFT HEART CATH AND CORONARY ANGIOGRAPHY
Anesthesia: LOCAL

## 2017-09-27 MED ORDER — MELATONIN 3 MG PO TABS
3.0000 mg | ORAL_TABLET | Freq: Every evening | ORAL | Status: DC | PRN
  Administered 2017-09-27 – 2017-09-28 (×2): 3 mg via ORAL
  Filled 2017-09-27 (×3): qty 1

## 2017-09-27 MED ORDER — ONDANSETRON HCL 4 MG/2ML IJ SOLN: 4.0000 mg | Freq: Four times a day (QID) | INTRAMUSCULAR | Status: DC | PRN

## 2017-09-27 MED ORDER — LEVOTHYROXINE SODIUM 100 MCG PO TABS
200.0000 ug | ORAL_TABLET | Freq: Every day | ORAL | Status: DC
  Administered 2017-09-28 – 2017-10-04 (×7): 200 ug via ORAL
  Filled 2017-09-27 (×8): qty 2

## 2017-09-27 MED ORDER — MIDAZOLAM HCL 2 MG/2ML IJ SOLN
INTRAMUSCULAR | Status: AC
  Filled 2017-09-27: qty 2

## 2017-09-27 MED ORDER — ACETAMINOPHEN 325 MG PO TABS
650.0000 mg | ORAL_TABLET | ORAL | Status: DC | PRN
  Administered 2017-09-27: 650 mg via ORAL

## 2017-09-27 MED ORDER — ZOLPIDEM TARTRATE 5 MG PO TABS: 5.0000 mg | ORAL_TABLET | Freq: Every evening | ORAL | Status: DC | PRN

## 2017-09-27 MED ORDER — LIDOCAINE HCL (PF) 1 % IJ SOLN
INTRAMUSCULAR | Status: DC | PRN
  Administered 2017-09-27: 2 mL

## 2017-09-27 MED ORDER — HEPARIN (PORCINE) IN NACL 100-0.45 UNIT/ML-% IJ SOLN
1100.0000 [IU]/h | INTRAMUSCULAR | Status: DC
  Administered 2017-09-28: 950 [IU]/h via INTRAVENOUS
  Administered 2017-09-29 – 2017-09-30 (×2): 1100 [IU]/h via INTRAVENOUS
  Filled 2017-09-27 (×3): qty 250

## 2017-09-27 MED ORDER — SODIUM CHLORIDE 0.9 % IV SOLN: 250.0000 mL | INTRAVENOUS | Status: DC | PRN

## 2017-09-27 MED ORDER — SODIUM CHLORIDE 0.9% FLUSH: 3.0000 mL | Freq: Two times a day (BID) | INTRAVENOUS | Status: DC

## 2017-09-27 MED ORDER — HEPARIN SODIUM (PORCINE) 1000 UNIT/ML IJ SOLN
INTRAMUSCULAR | Status: DC | PRN
  Administered 2017-09-27: 5000 [IU] via INTRAVENOUS

## 2017-09-27 MED ORDER — SODIUM CHLORIDE 0.9 % IV SOLN
INTRAVENOUS | Status: AC
  Administered 2017-09-27: 16:00:00 via INTRAVENOUS

## 2017-09-27 MED ORDER — IOHEXOL 350 MG/ML SOLN
INTRAVENOUS | Status: DC | PRN
  Administered 2017-09-27: 140 mL via INTRAVENOUS

## 2017-09-27 MED ORDER — SODIUM CHLORIDE 0.9% FLUSH
3.0000 mL | Freq: Two times a day (BID) | INTRAVENOUS | Status: DC
  Administered 2017-09-28 – 2017-09-29 (×3): 3 mL via INTRAVENOUS

## 2017-09-27 MED ORDER — BUPROPION HCL ER (XL) 150 MG PO TB24
150.0000 mg | ORAL_TABLET | Freq: Every day | ORAL | Status: DC
  Administered 2017-09-27 – 2017-10-04 (×7): 150 mg via ORAL
  Filled 2017-09-27 (×8): qty 1

## 2017-09-27 MED ORDER — ACETAMINOPHEN 325 MG PO TABS
650.0000 mg | ORAL_TABLET | ORAL | Status: DC | PRN
  Administered 2017-09-28 (×3): 650 mg via ORAL
  Filled 2017-09-27 (×4): qty 2

## 2017-09-27 MED ORDER — FENTANYL CITRATE (PF) 100 MCG/2ML IJ SOLN
INTRAMUSCULAR | Status: AC
  Filled 2017-09-27: qty 2

## 2017-09-27 MED ORDER — MOMETASONE FURO-FORMOTEROL FUM 200-5 MCG/ACT IN AERO
2.0000 | INHALATION_SPRAY | Freq: Two times a day (BID) | RESPIRATORY_TRACT | Status: DC
  Administered 2017-09-27 – 2017-10-04 (×11): 2 via RESPIRATORY_TRACT
  Filled 2017-09-27 (×3): qty 8.8

## 2017-09-27 MED ORDER — ASPIRIN 81 MG PO CHEW: 81.0000 mg | CHEWABLE_TABLET | ORAL | Status: DC

## 2017-09-27 MED ORDER — MORPHINE SULFATE (PF) 4 MG/ML IV SOLN
4.0000 mg | Freq: Once | INTRAVENOUS | Status: AC
  Administered 2017-09-27: 4 mg via INTRAVENOUS
  Filled 2017-09-27: qty 1

## 2017-09-27 MED ORDER — HEPARIN (PORCINE) IN NACL 100-0.45 UNIT/ML-% IJ SOLN
950.0000 [IU]/h | INTRAMUSCULAR | Status: DC
  Administered 2017-09-27: 950 [IU]/h via INTRAVENOUS
  Filled 2017-09-27: qty 250

## 2017-09-27 MED ORDER — PANTOPRAZOLE SODIUM 40 MG PO TBEC
40.0000 mg | DELAYED_RELEASE_TABLET | Freq: Every day | ORAL | Status: DC
  Administered 2017-09-27 – 2017-09-29 (×3): 40 mg via ORAL
  Filled 2017-09-27 (×3): qty 1

## 2017-09-27 MED ORDER — SODIUM CHLORIDE 0.9% FLUSH: 3.0000 mL | INTRAVENOUS | Status: DC | PRN

## 2017-09-27 MED ORDER — HEPARIN BOLUS VIA INFUSION
4000.0000 [IU] | Freq: Once | INTRAVENOUS | Status: AC
  Administered 2017-09-27: 4000 [IU] via INTRAVENOUS
  Filled 2017-09-27: qty 4000

## 2017-09-27 MED ORDER — NITROGLYCERIN 0.4 MG SL SUBL: 0.4000 mg | SUBLINGUAL_TABLET | SUBLINGUAL | Status: DC | PRN

## 2017-09-27 MED ORDER — HEPARIN (PORCINE) IN NACL 1000-0.9 UT/500ML-% IV SOLN
INTRAVENOUS | Status: AC
  Filled 2017-09-27: qty 1000

## 2017-09-27 MED ORDER — NITROGLYCERIN IN D5W 200-5 MCG/ML-% IV SOLN
0.0000 ug/min | INTRAVENOUS | Status: DC
  Administered 2017-09-27: 5 ug/min via INTRAVENOUS
  Administered 2017-09-29: 10 ug/min via INTRAVENOUS
  Administered 2017-09-29: 15 ug/min via INTRAVENOUS
  Administered 2017-09-30: 10 ug/min via INTRAVENOUS
  Filled 2017-09-27 (×2): qty 250

## 2017-09-27 MED ORDER — METOPROLOL TARTRATE 12.5 MG HALF TABLET
12.5000 mg | ORAL_TABLET | Freq: Two times a day (BID) | ORAL | Status: DC
  Administered 2017-09-27 – 2017-09-29 (×6): 12.5 mg via ORAL
  Filled 2017-09-27 (×6): qty 1

## 2017-09-27 MED ORDER — ATORVASTATIN CALCIUM 80 MG PO TABS
80.0000 mg | ORAL_TABLET | Freq: Every day | ORAL | Status: DC
  Administered 2017-09-27 – 2017-10-03 (×6): 80 mg via ORAL
  Filled 2017-09-27 (×7): qty 1

## 2017-09-27 MED ORDER — VERAPAMIL HCL 2.5 MG/ML IV SOLN
INTRAVENOUS | Status: DC | PRN
  Administered 2017-09-27: 10 mL via INTRA_ARTERIAL

## 2017-09-27 MED ORDER — SODIUM CHLORIDE 0.9 % WEIGHT BASED INFUSION: 1.0000 mL/kg/h | INTRAVENOUS | Status: DC

## 2017-09-27 MED ORDER — SODIUM CHLORIDE 0.9 % IV SOLN
INTRAVENOUS | Status: DC
  Administered 2017-09-27: 03:00:00 via INTRAVENOUS

## 2017-09-27 MED ORDER — LIDOCAINE HCL (PF) 1 % IJ SOLN
INTRAMUSCULAR | Status: AC
  Filled 2017-09-27: qty 30

## 2017-09-27 MED ORDER — MIDAZOLAM HCL 2 MG/2ML IJ SOLN
INTRAMUSCULAR | Status: DC | PRN
  Administered 2017-09-27 (×2): 1 mg via INTRAVENOUS

## 2017-09-27 MED ORDER — ASPIRIN EC 81 MG PO TBEC
81.0000 mg | DELAYED_RELEASE_TABLET | Freq: Every day | ORAL | Status: DC
  Administered 2017-09-27 – 2017-09-29 (×3): 81 mg via ORAL
  Filled 2017-09-27 (×2): qty 1

## 2017-09-27 MED ORDER — HEPARIN SODIUM (PORCINE) 1000 UNIT/ML IJ SOLN
INTRAMUSCULAR | Status: AC
  Filled 2017-09-27: qty 1

## 2017-09-27 MED ORDER — INSULIN GLARGINE 100 UNIT/ML ~~LOC~~ SOLN
10.0000 [IU] | Freq: Every morning | SUBCUTANEOUS | Status: DC
  Administered 2017-09-28 – 2017-09-29 (×2): 10 [IU] via SUBCUTANEOUS
  Filled 2017-09-27 (×4): qty 0.1

## 2017-09-27 MED ORDER — FENTANYL CITRATE (PF) 100 MCG/2ML IJ SOLN
INTRAMUSCULAR | Status: DC | PRN
  Administered 2017-09-27: 25 ug via INTRAVENOUS
  Administered 2017-09-27: 50 ug via INTRAVENOUS

## 2017-09-27 MED ORDER — ASPIRIN 81 MG PO CHEW
324.0000 mg | CHEWABLE_TABLET | Freq: Once | ORAL | Status: AC
  Administered 2017-09-27: 324 mg via ORAL
  Filled 2017-09-27: qty 4

## 2017-09-27 MED ORDER — SODIUM CHLORIDE 0.9 % WEIGHT BASED INFUSION: 3.0000 mL/kg/h | INTRAVENOUS | Status: DC

## 2017-09-27 MED ORDER — OXYCODONE HCL 5 MG PO TABS
5.0000 mg | ORAL_TABLET | ORAL | Status: DC | PRN
  Administered 2017-09-29: 5 mg via ORAL
  Filled 2017-09-27: qty 1

## 2017-09-27 MED ORDER — VERAPAMIL HCL 2.5 MG/ML IV SOLN
INTRAVENOUS | Status: AC
  Filled 2017-09-27: qty 2

## 2017-09-27 MED ORDER — HEPARIN (PORCINE) IN NACL 2-0.9 UNITS/ML
INTRAMUSCULAR | Status: AC | PRN
  Administered 2017-09-27 (×2): 500 mL

## 2017-09-27 SURGICAL SUPPLY — 13 items
CATH 5FR JL3.5 JR4 ANG PIG MP (CATHETERS) ×2 IMPLANT
CATH LAUNCHER 5F EBU3.0 (CATHETERS) ×1 IMPLANT
CATH LAUNCHER 5F EBU3.5 (CATHETERS) ×2 IMPLANT
CATH LAUNCHER 6FR EBU 3 (CATHETERS) ×2 IMPLANT
CATHETER LAUNCHER 5F EBU3.0 (CATHETERS) ×2
DEVICE RAD COMP TR BAND LRG (VASCULAR PRODUCTS) ×2 IMPLANT
GLIDESHEATH SLEND A-KIT 6F 22G (SHEATH) ×2 IMPLANT
GUIDEWIRE INQWIRE 1.5J.035X260 (WIRE) ×1 IMPLANT
INQWIRE 1.5J .035X260CM (WIRE) ×2
KIT HEART LEFT (KITS) ×2 IMPLANT
PACK CARDIAC CATHETERIZATION (CUSTOM PROCEDURE TRAY) ×2 IMPLANT
TRANSDUCER W/STOPCOCK (MISCELLANEOUS) ×2 IMPLANT
TUBING CIL FLEX 10 FLL-RA (TUBING) ×2 IMPLANT

## 2017-09-27 NOTE — Progress Notes (Signed)
Progress Note  Patient Name: Dan Lester Date of Encounter: 09/27/2017  Primary Cardiologist:  New to Dan Lester  Primary MD :  Dan Lester   Subjective   72 year old gentleman with a history of hyperlipidemia, hypothyroidism, diabetes mellitus who was admitted with a non-ST segment elevation myocardial infarction.  EKG shows normal sinus rhythm.  He has mild ST segment depression in the inferior and lateral leads.   Inpatient Medications    Scheduled Meds: . [START ON 09/28/2017] aspirin EC  81 mg Oral Daily  . atorvastatin  80 mg Oral q1800  . buPROPion  150 mg Oral Daily  . insulin glargine  10 Units Subcutaneous q morning - 10a  . levothyroxine  200 mcg Oral QAC breakfast  . metoprolol tartrate  12.5 mg Oral BID  . mometasone-formoterol  2 puff Inhalation BID  . pantoprazole  40 mg Oral Daily   Continuous Infusions: . sodium chloride 20 mL/hr at 09/27/17 0308  . heparin 950 Units/hr (09/27/17 0308)  . nitroGLYCERIN 15 mcg/min (09/27/17 0415)   PRN Meds: acetaminophen, Melatonin, nitroGLYCERIN, ondansetron (ZOFRAN) IV   Vital Signs    Vitals:   09/27/17 0400 09/27/17 0415 09/27/17 0456 09/27/17 0829  BP: 124/81 111/77 133/80 129/74  Pulse: 69 66 63 74  Resp: 15 19 17 17   Temp:   (!) 96 F (35.6 C) 98.1 F (36.7 C)  TempSrc:   Oral Oral  SpO2: 94% 95% 98% 98%  Weight:   179 lb 14.3 oz (81.6 kg)   Height:       No intake or output data in the 24 hours ending 09/27/17 0851 Filed Weights   09/27/17 0037 09/27/17 0456  Weight: 180 lb (81.6 kg) 179 lb 14.3 oz (81.6 kg)    Telemetry    NSR  - Personally Reviewed  ECG     NSR , mild  NS ST abnl. - Personally Reviewed  Physical Exam   GEN: No acute distress.   Neck: No JVD Cardiac: RRR, no murmurs, rubs, or gallops.  Respiratory: Clear to auscultation bilaterally. GI: Soft, nontender, non-distended  MS: No edema; No deformity.  Great right radial pulse and right fem pulse Neuro:  Nonfocal  Psych: Normal  affect   Labs    Chemistry Recent Labs  Lab 09/27/17 0045  NA 140  K 3.8  CL 103  CO2 28  GLUCOSE 139*  BUN 26*  CREATININE 1.21  CALCIUM 9.4  GFRNONAA 59*  GFRAA >60  ANIONGAP 9     Hematology Recent Labs  Lab 09/27/17 0045  WBC 7.7  RBC 4.58  HGB 14.3  HCT 41.9  MCV 91.5  MCH 31.2  MCHC 34.1  RDW 11.9  PLT 225    Cardiac EnzymesNo results for input(s): TROPONINI in the last 168 hours.  Recent Labs  Lab 09/27/17 0104  TROPIPOC 1.27*     BNPNo results for input(s): BNP, PROBNP in the last 168 hours.   DDimer No results for input(s): DDIMER in the last 168 hours.   Radiology    Dg Chest 2 View  Result Date: 09/27/2017 CLINICAL DATA:  Intermittent chest pain, nausea for 1 week. History of prostate cancer. EXAM: CHEST - 2 VIEW COMPARISON:  Chest radiograph November 03, 2004 FINDINGS: Cardiomediastinal silhouette is normal. No pleural effusions or focal consolidations. Mild bronchitic changes. Trachea projects midline and there is no pneumothorax. Soft tissue planes and included osseous structures are non-suspicious. Surgical clips in the included right abdomen compatible with cholecystectomy. Mild degenerative  change of the thoracic spine. IMPRESSION: Mild bronchitic changes.  No focal consolidation. Electronically Signed   By: Dan Lester M.D.   On: 09/27/2017 01:14    Cardiac Studies     Patient Profile     72 y.o. male admitted with chest pain and found to have a non-ST segment elevation myocardial infarction.  Assessment & Plan    1.  Coronary artery disease: Patient presents with symptoms and EKG changes consistent with a non-ST segment elevation MI .  His troponin is 1.27.  His pain is better this morning.  Schedule him for heart catheterization today.  I have discussed the risks, benefits, options.  He understands and agrees to proceed.  2. Diabetes mellitus.   Managed by primary MD   For questions or updates, please contact [facility] Please consult [email or web address] for contact info under Cardiology/STEMI.      Signed, Dan Moores, MD  09/27/2017, 8:51 AM

## 2017-09-27 NOTE — Interval H&P Note (Signed)
Cath Lab Visit (complete for each Cath Lab visit)  Clinical Evaluation Leading to the Procedure:   ACS: Yes.    Non-ACS:    Anginal Classification: CCS III  Anti-ischemic medical therapy: Minimal Therapy (1 class of medications)  Non-Invasive Test Results: No non-invasive testing performed  Prior CABG: No previous CABG      History and Physical Interval Note:  09/27/2017 2:57 PM  Dan Lester  has presented today for surgery, with the diagnosis of cp  The various methods of treatment have been discussed with the patient and family. After consideration of risks, benefits and other options for treatment, the patient has consented to  Procedure(s): LEFT HEART CATH AND CORONARY ANGIOGRAPHY (N/A) as a surgical intervention .  The patient's history has been reviewed, patient examined, no change in status, stable for surgery.  I have reviewed the patient's chart and labs.  Questions were answered to the patient's satisfaction.     Belva Crome III

## 2017-09-27 NOTE — ED Provider Notes (Signed)
[facility] EMERGENCY DEPARTMENT Provider Note   CSN: [id number] Arrival date & time: 09/27/17  0033     History   Chief Complaint Chief Complaint  Patient presents with  . Chest Pain    HPI Dan Lester is a 72 y.o. male.  Patient with past medical history remarkable for diabetes and hyperlipidemia presents to the emergency department with a chief complaint of chest pain.  He states the symptoms started at about 10 PM.  He describes them as sharp and radiates to his right arm.  He reports associated diaphoresis.  He denies any shortness of breath.  States that he had had some symptoms earlier in the week, but never as bad as tonight.  He has not taken any aspirin.  He is not anticoagulated.  He has no known cardiac history.  He rates his pain as 10 out of 10 at the most severe, and 3 out of 10 when it eases off.  The history is provided by the patient. No language interpreter was used.    Past Medical History:  Diagnosis Date  . Arthritis   . Asthma   . Cancer [facility])    prostate  . Depression   . Diabetes mellitus without complication (HCC)    , diet controlled  . GERD (gastroesophageal reflux disease)   . H/O colonoscopy 03/12/2009  . History of kidney stones   . Hyperlipidemia   . Hypothyroidism   . Inguinal hernia   . Neck pain   . Thyroid disease    hyperthyroidism; surgery changed him to hypo    Patient Active Problem List   Diagnosis Date Noted  . Stage 4 pressure ulcer ([place]) 03/22/2017  . Abscess of right thigh 11/06/2013  . Chronic cholecystitis with calculus 10/19/2013  . Hernia, inguinal, left 12/17/2011    Past Surgical History:  Procedure Laterality Date  . BACK SURGERY  09/2005  . CHOLECYSTECTOMY    . DEBRIDMENT OF DECUBITUS ULCER Right 08/29/2015   Procedure: COMPLEX REPAIR BUTTOCKS 3CM;  Surgeon: Irene Limbo, MD;  Location: [place];  Service: Plastics;  Laterality: Right;  . excision of Ischial pressure  ulcer Right 03/23/2017  . EYE SURGERY     tumor removed; right  . HERNIA REPAIR    . MASS EXCISION Right 03/22/2017   Procedure: EXCISION OF ISCHIAL PRESSURE  ULCER SKIN FLAP CLOSURE;  Surgeon: Irene Limbo, MD;  Location: [place];  Service: Plastics;  Laterality: Right;  . PROSTATE SURGERY  11/2004   seed implant  . SINUS SURGERY WITH INSTATRAK          Home Medications    Prior to Admission medications   Medication Sig Start Date End Date Taking? Authorizing Provider  buPROPion (WELLBUTRIN XL) 150 MG 24 hr tablet Take 150 mg daily by mouth.    [provider]  enoxaparin (LOVENOX) 40 MG/0.4ML injection Inject 0.4 mLs (40 mg total) into the skin daily. 03/24/17   Irene Limbo, MD  fluticasone (FLONASE) 50 MCG/ACT nasal spray Place 2 sprays daily into both nostrils.    [provider]  Fluticasone-Salmeterol (ADVAIR) 250-50 MCG/DOSE AEPB Inhale 1 puff daily as needed into the lungs (shortness of breath).     [provider]  HYDROcodone-acetaminophen (NORCO) 5-325 MG tablet Take 1 tablet by mouth every 4 (four) hours as needed for moderate pain. 03/24/17   Irene Limbo, MD  ibuprofen (ADVIL,MOTRIN) 200 MG tablet Take 800 mg 2 (two) times daily as needed by mouth  for moderate pain.    [provider]  insulin glargine (LANTUS) 100 UNIT/ML injection Inject 10 Units into the skin every morning.    [provider]  levothyroxine (SYNTHROID) 200 MCG tablet Take 200 mcg every evening by mouth.     [provider]  MELATONIN PO Take 1 tablet at bedtime as needed by mouth (sleep).    [provider]  metFORMIN (GLUCOPHAGE) 500 MG tablet Take 500 mg 2 (two) times daily with a meal by mouth.    [provider]  Multiple Vitamin (MULTIVITAMIN WITH MINERALS) TABS tablet Take 1 tablet daily by mouth.    [provider]  nabumetone (RELAFEN) 500 MG tablet Take 500 mg daily by mouth.    [provider]   pantoprazole (PROTONIX) 40 MG tablet Take 40 mg daily by mouth.    [provider]  SUPER B COMPLEX/C CAPS Take 1 tablet daily by mouth.    [provider]    Family History Family History  Problem Relation Age of Onset  . Heart disease Mother   . Cancer Father        colon  . Heart disease Father     Social History Social History   Tobacco Use  . Smoking status: Former Smoker    Types: Cigars  . Smokeless tobacco: Never Used  Substance Use Topics  . Alcohol use: No  . Drug use: No     Allergies   Patient has no known allergies.   Review of Systems Review of Systems  All other systems reviewed and are negative.    Physical Exam Updated Vital Signs BP (!) 165/71 (BP Location: Left Arm)   Pulse 86   Temp 98.1 F (36.7 C) (Oral)   Resp 18   Ht 6\' 2"  (1.88 m)   Wt 81.6 kg (180 lb)   SpO2 98%   BMI 23.11 kg/m   Physical Exam  Constitutional: He is oriented to person, place, and time. He appears well-developed and well-nourished.  HENT:  Head: Normocephalic and atraumatic.  Eyes: Pupils are equal, round, and reactive to light. Conjunctivae and EOM are normal. Right eye exhibits no discharge. Left eye exhibits no discharge. No scleral icterus.  Neck: Normal range of motion. Neck supple. No JVD present.  Cardiovascular: Normal rate, regular rhythm and normal heart sounds. Exam reveals no gallop and no friction rub.  No murmur heard. Pulmonary/Chest: Effort normal and breath sounds normal. No respiratory distress. He has no wheezes. He has no rales. He exhibits no tenderness.  Abdominal: Soft. He exhibits no distension and no mass. There is no tenderness. There is no rebound and no guarding.  Musculoskeletal: Normal range of motion. He exhibits no edema or tenderness.  Neurological: He is alert and oriented to person, place, and time.  Skin: Skin is warm and dry.  Psychiatric: He has a normal mood and affect. His behavior is normal. Judgment  and thought content normal.  Nursing note and vitals reviewed.    ED Treatments / Results  Labs (all labs ordered are listed, but only abnormal results are displayed) Labs Reviewed  BASIC METABOLIC PANEL - Abnormal; Notable for the following components:      Result Value   Glucose, Bld 139 (*)    BUN 26 (*)    GFR calc non Af Amer 59 (*)    All other components within normal limits  I-STAT TROPONIN, ED - Abnormal; Notable for the following components:   Troponin i,  poc 1.27 (*)    All other components within normal limits  CBC  HEPARIN LEVEL (UNFRACTIONATED)    EKG EKG Interpretation  Date/Time:  Tuesday September 27 2017 00:35:37 EDT Ventricular Rate:  86 PR Interval:  192 QRS Duration: 72 QT Interval:  352 QTC Calculation: 421 R Axis:   -3 Text Interpretation:  Normal sinus rhythm Left ventricular hypertrophy with repolarization abnormality Abnormal ECG Biphasic T in Lead III Confirmed by Thayer Jew [phone]) on 09/27/2017 2:17:39 AM   Radiology Dg Chest 2 View  Result Date: 09/27/2017 CLINICAL DATA:  Intermittent chest pain, nausea for 1 week. History of prostate cancer. EXAM: CHEST - 2 VIEW COMPARISON:  Chest radiograph November 03, 2004 FINDINGS: Cardiomediastinal silhouette is normal. No pleural effusions or focal consolidations. Mild bronchitic changes. Trachea projects midline and there is no pneumothorax. Soft tissue planes and included osseous structures are non-suspicious. Surgical clips in the included right abdomen compatible with cholecystectomy. Mild degenerative change of the thoracic spine. IMPRESSION: Mild bronchitic changes.  No focal consolidation. Electronically Signed   By: Elon Alas M.D.   On: 09/27/2017 01:14    Procedures Procedures (including critical care time) CRITICAL CARE Performed by: Montine Circle   Total critical care time: 36 minutes  Critical care time was exclusive of separately billable procedures and treating other  patients.  Critical care was necessary to treat or prevent imminent or life-threatening deterioration.  Critical care was time spent personally by me on the following activities: development of treatment plan with patient and/or surrogate as well as nursing, discussions with consultants, evaluation of patient's response to treatment, examination of patient, obtaining history from patient or surrogate, ordering and performing treatments and interventions, ordering and review of laboratory studies, ordering and review of radiographic studies, pulse oximetry and re-evaluation of patient's condition.  Medications Ordered in ED Medications  aspirin chewable tablet 324 mg (has no administration in time range)  morphine 4 MG/ML injection 4 mg (has no administration in time range)  heparin bolus via infusion 4,000 Units (has no administration in time range)  heparin ADULT infusion 100 units/mL (25000 units/230mL sodium chloride 0.45%) (has no administration in time range)  0.9 %  sodium chloride infusion (has no administration in time range)  nitroGLYCERIN 50 mg in dextrose 5 % 250 mL (0.2 mg/mL) infusion (has no administration in time range)     Initial Impression / Assessment and Plan / ED Course  I have reviewed the triage vital signs and the nursing notes.  Pertinent labs & imaging results that were available during my care of the patient were reviewed by me and considered in my medical decision making (see chart for details).     Patient with sudden onset sharp chest pain that started at 10 PM tonight.  There is associated radiating pain and diaphoresis.  Will give aspirin, start nitro drip and heparin.  Troponin is elevated at 1.27.  Discussed with Dr. Dina Rich, who agrees with treatment for NSTEMI and cards consult.  Spoke with on-call cardiologist, who will evaluate the patient.  Final Clinical Impressions(s) / ED Diagnoses   Final diagnoses:  NSTEMI (non-ST elevated myocardial  infarction) [facility])    ED Discharge Orders    None       Montine Circle, PA-C 09/27/17 [id number]    Merryl Hacker, MD 09/28/17 [phone]

## 2017-09-27 NOTE — Progress Notes (Signed)
Pt left for the cath lab.  Wife with the pt.  No verbal complaints of pain or discomfort at this time.  Pt appears to be very excited about going down.  Pt states "tired of waiting".

## 2017-09-27 NOTE — Progress Notes (Signed)
[place] for heparin Indication: chest pain/ACS  No Known Allergies  Patient Measurements: Height: 6\' 2"  (188 cm) Weight: 179 lb 15.7 oz (81.6 kg) IBW/kg (Calculated) : 82.2  Vital Signs: Temp: 98.2 F (36.8 C) (06/04 1659) Temp Source: Oral (06/04 1659) BP: 119/77 (06/04 1659) Pulse Rate: 69 (06/04 1659)  Labs: Recent Labs    09/27/17 0045 09/27/17 0726 09/27/17 1233  HGB 14.3  --   --   HCT 41.9  --   --   PLT 225  --   --   LABPROT  --  13.8  --   INR  --  1.07  --   HEPARINUNFRC  --  0.55  --   CREATININE 1.21  --   --   TROPONINI  --  2.49* 4.48*   Estimated Creatinine Clearance: 66.5 mL/min (by C-G formula based on SCr of 1.21 mg/dL).  Medical History: Past Medical History:  Diagnosis Date  . Arthritis   . Asthma   . Cancer [facility])    prostate  . Depression   . Diabetes mellitus without complication (HCC)    , diet controlled  . GERD (gastroesophageal reflux disease)   . H/O colonoscopy 03/12/2009  . History of kidney stones   . Hyperlipidemia   . Hypothyroidism   . Inguinal hernia   . Neck pain   . Thyroid disease    hyperthyroidism; surgery changed him to hypo   Assessment: 72yo male c/o intermitted CP associated w/ nausea and cold sweats x1 wk, troponin found to be elevated, to begin heparin.   Pt now s/p cath lab, awaiting TCTS consult. Pharmacy asked to resume heparin 8 hrs after sheath out.  Sheath removed at 1550 PM.  Goal of Therapy:  Heparin level 0.3-0.7 units/ml Monitor platelets by anticoagulation protocol: Yes   Plan:  Resume heparin gtt at 950 units/hr at midnight. Check heparin level 6 hrs after gtt resumes. Daily heparin levels and CBC. Monitor for s/sx of bleeding  Marguerite Olea, [facility] Clinical Pharmacist Pager [phone]  09/27/2017 5:06 PM

## 2017-09-27 NOTE — Progress Notes (Signed)
[place] for heparin Indication: chest pain/ACS  No Known Allergies  Patient Measurements: Height: 6\' 2"  (188 cm) Weight: 179 lb 14.3 oz (81.6 kg) IBW/kg (Calculated) : 82.2  Vital Signs: Temp: 98.1 F (36.7 C) (06/04 0829) Temp Source: Oral (06/04 0829) BP: 129/74 (06/04 0829) Pulse Rate: 74 (06/04 0829)  Labs: Recent Labs    09/27/17 0045 09/27/17 0726  HGB 14.3  --   HCT 41.9  --   PLT 225  --   LABPROT  --  13.8  INR  --  1.07  HEPARINUNFRC  --  0.55  CREATININE 1.21  --   TROPONINI  --  2.49*   Estimated Creatinine Clearance: 66.5 mL/min (by C-G formula based on SCr of 1.21 mg/dL).  Medical History: Past Medical History:  Diagnosis Date  . Arthritis   . Asthma   . Cancer [facility])    prostate  . Depression   . Diabetes mellitus without complication (HCC)    , diet controlled  . GERD (gastroesophageal reflux disease)   . H/O colonoscopy 03/12/2009  . History of kidney stones   . Hyperlipidemia   . Hypothyroidism   . Inguinal hernia   . Neck pain   . Thyroid disease    hyperthyroidism; surgery changed him to hypo   Assessment: 72yo male c/o intermitted CP associated w/ nausea and cold sweats x1 wk, troponin found to be elevated, to begin heparin. Awaiting cardiac cath later today.  Initial heparin level therapeutic: 0.55, no overt bleeding documented   Goal of Therapy:  Heparin level 0.3-0.7 units/ml Monitor platelets by anticoagulation protocol: Yes   Plan:  Heparin gtt at 950 units/hr  Daily heparin levels and CBC. Monitor for s/sx of bleeding  Georga Bora, PharmD Clinical Pharmacist 09/27/2017 10:37 AM

## 2017-09-27 NOTE — Progress Notes (Signed)
Critical lab result called : troponin 2.49  Dr. Acie Fredrickson made aware of the results.  Pt will be going to the cath lab per dr statement

## 2017-09-27 NOTE — Progress Notes (Signed)
ANTICOAGULATION CONSULT NOTE - Initial Consult  Pharmacy Consult for heparin Indication: chest pain/ACS  No Known Allergies  Patient Measurements: Height: 6\' 2"  (188 cm) Weight: 180 lb (81.6 kg) IBW/kg (Calculated) : 82.2  Vital Signs: Temp: 98.1 F (36.7 C) (06/04 0035) Temp Source: Oral (06/04 0035) BP: 165/71 (06/04 0154) Pulse Rate: 86 (06/04 0154)  Labs: Recent Labs    09/27/17 0045  HGB 14.3  HCT 41.9  PLT 225  CREATININE 1.21    Estimated Creatinine Clearance: 66.5 mL/min (by C-G formula based on SCr of 1.21 mg/dL).   Medical History: Past Medical History:  Diagnosis Date  . Arthritis   . Asthma   . Cancer [facility])    prostate  . Depression   . Diabetes mellitus without complication (HCC)    , diet controlled  . GERD (gastroesophageal reflux disease)   . H/O colonoscopy 03/12/2009  . History of kidney stones   . Hyperlipidemia   . Hypothyroidism   . Inguinal hernia   . Neck pain   . Thyroid disease    hyperthyroidism; surgery changed him to hypo    Assessment: 72yo male c/o intermitted CP associated w/ nausea and cold sweats x1 wk, troponin found to be elevated, to begin heparin.  Goal of Therapy:  Heparin level 0.3-0.7 units/ml Monitor platelets by anticoagulation protocol: Yes   Plan:  Will give heparin 4000 units IV bolus x1 followed by gtt at 950 units/hr and monitor heparin levels and CBC.  Wynona Neat, PharmD, BCPS  09/27/2017,2:14 AM

## 2017-09-27 NOTE — H&P (View-Only) (Signed)
Progress Note  Patient Name: Dan Lester Date of Encounter: 09/27/2017  Primary Cardiologist:  New to [facility]  Primary MD :  Reynaldo Minium   Subjective   72 year old gentleman with a history of hyperlipidemia, hypothyroidism, diabetes mellitus who was admitted with a non-ST segment elevation myocardial infarction.  EKG shows normal sinus rhythm.  He has mild ST segment depression in the inferior and lateral leads.   Inpatient Medications    Scheduled Meds: . [START ON 09/28/2017] aspirin EC  81 mg Oral Daily  . atorvastatin  80 mg Oral q1800  . buPROPion  150 mg Oral Daily  . insulin glargine  10 Units Subcutaneous q morning - 10a  . levothyroxine  200 mcg Oral QAC breakfast  . metoprolol tartrate  12.5 mg Oral BID  . mometasone-formoterol  2 puff Inhalation BID  . pantoprazole  40 mg Oral Daily   Continuous Infusions: . sodium chloride 20 mL/hr at 09/27/17 0308  . heparin 950 Units/hr (09/27/17 0308)  . nitroGLYCERIN 15 mcg/min (09/27/17 0415)   PRN Meds: acetaminophen, Melatonin, nitroGLYCERIN, ondansetron (ZOFRAN) IV   Vital Signs    Vitals:   09/27/17 0400 09/27/17 0415 09/27/17 0456 09/27/17 0829  BP: 124/81 111/77 133/80 129/74  Pulse: 69 66 63 74  Resp: 15 19 17 17   Temp:   (!) 96 F (35.6 C) 98.1 F (36.7 C)  TempSrc:   Oral Oral  SpO2: 94% 95% 98% 98%  Weight:   179 lb 14.3 oz (81.6 kg)   Height:       No intake or output data in the 24 hours ending 09/27/17 0851 Filed Weights   09/27/17 0037 09/27/17 0456  Weight: 180 lb (81.6 kg) 179 lb 14.3 oz (81.6 kg)    Telemetry    NSR  - Personally Reviewed  ECG     NSR , mild  NS ST abnl. - Personally Reviewed  Physical Exam   GEN: No acute distress.   Neck: No JVD Cardiac: RRR, no murmurs, rubs, or gallops.  Respiratory: Clear to auscultation bilaterally. GI: Soft, nontender, non-distended  MS: No edema; No deformity.  Great right radial pulse and right fem pulse Neuro:  Nonfocal  Psych: Normal  affect   Labs    Chemistry Recent Labs  Lab 09/27/17 0045  NA 140  K 3.8  CL 103  CO2 28  GLUCOSE 139*  BUN 26*  CREATININE 1.21  CALCIUM 9.4  GFRNONAA 59*  GFRAA >60  ANIONGAP 9     Hematology Recent Labs  Lab 09/27/17 0045  WBC 7.7  RBC 4.58  HGB 14.3  HCT 41.9  MCV 91.5  MCH 31.2  MCHC 34.1  RDW 11.9  PLT 225    Cardiac EnzymesNo results for input(s): TROPONINI in the last 168 hours.  Recent Labs  Lab 09/27/17 0104  TROPIPOC 1.27*     BNPNo results for input(s): BNP, PROBNP in the last 168 hours.   DDimer No results for input(s): DDIMER in the last 168 hours.   Radiology    Dg Chest 2 View  Result Date: 09/27/2017 CLINICAL DATA:  Intermittent chest pain, nausea for 1 week. History of prostate cancer. EXAM: CHEST - 2 VIEW COMPARISON:  Chest radiograph November 03, 2004 FINDINGS: Cardiomediastinal silhouette is normal. No pleural effusions or focal consolidations. Mild bronchitic changes. Trachea projects midline and there is no pneumothorax. Soft tissue planes and included osseous structures are non-suspicious. Surgical clips in the included right abdomen compatible with cholecystectomy. Mild degenerative  change of the thoracic spine. IMPRESSION: Mild bronchitic changes.  No focal consolidation. Electronically Signed   By: Elon Alas M.D.   On: 09/27/2017 01:14    Cardiac Studies     Patient Profile     72 y.o. male admitted with chest pain and found to have a non-ST segment elevation myocardial infarction.  Assessment & Plan    1.  Coronary artery disease: Patient presents with symptoms and EKG changes consistent with a non-ST segment elevation MI .  His troponin is 1.27.  His pain is better this morning.  Schedule him for heart catheterization today.  I have discussed the risks, benefits, options.  He understands and agrees to proceed.  2. Diabetes mellitus.   Managed by primary MD   For questions or updates, please contact [facility] Please consult [email or web address] for contact info under Cardiology/STEMI.      Signed, Mertie Moores, MD  09/27/2017, 8:51 AM

## 2017-09-27 NOTE — ED Triage Notes (Signed)
Patient c/o intermittent CP, nausea, and cold sweats x1 week. Denies SOB.

## 2017-09-27 NOTE — H&P (Signed)
History & Physical    Patient ID: Dan Lester MRN: [id number], DOB/AGE: 1946/03/18   Admit date: 09/27/2017   Primary Physician: Burnard Bunting, MD   Patient Profile    72 year old man with an NSTEMI  Past Medical History    Past Medical History:  Diagnosis Date  . Arthritis   . Asthma   . Cancer [facility])    prostate  . Depression   . Diabetes mellitus without complication (HCC)    , diet controlled  . GERD (gastroesophageal reflux disease)   . H/O colonoscopy 03/12/2009  . History of kidney stones   . Hyperlipidemia   . Hypothyroidism   . Inguinal hernia   . Neck pain   . Thyroid disease    hyperthyroidism; surgery changed him to hypo    Past Surgical History:  Procedure Laterality Date  . BACK SURGERY  09/2005  . CHOLECYSTECTOMY    . DEBRIDMENT OF DECUBITUS ULCER Right 08/29/2015   Procedure: COMPLEX REPAIR BUTTOCKS 3CM;  Surgeon: Irene Limbo, MD;  Location: [place];  Service: Plastics;  Laterality: Right;  . excision of Ischial pressure ulcer Right 03/23/2017  . EYE SURGERY     tumor removed; right  . HERNIA REPAIR    . MASS EXCISION Right 03/22/2017   Procedure: EXCISION OF ISCHIAL PRESSURE  ULCER SKIN FLAP CLOSURE;  Surgeon: Irene Limbo, MD;  Location: [place];  Service: Plastics;  Laterality: Right;  . PROSTATE SURGERY  11/2004   seed implant  . SINUS SURGERY WITH INSTATRAK       Allergies  No Known Allergies  History of Present Illness    72 year old man with a past medical history of diabetes, asthma, history of a decubitus ulcer and recurrent back issues presents with a new onset of chest pain starting 2 days ago.  Pain was retrosternal with radiation to the right arm.  Pain got acutely worse today was present at rest and with activity.  Was not relieved by his inhalers thus the patient got worried and came to the emergency room.  Pain not worse with change in position or with compression of the breastbone.  No recent  change in medications.  No recent stressors.  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  Is not exercising.  Has no known coronary artery disease history or cardiac disease history for that matter  Home Medications    Prior to Admission medications   Medication Sig Start Date End Date Taking? Authorizing Provider  buPROPion (WELLBUTRIN XL) 150 MG 24 hr tablet Take 150 mg daily by mouth.    [provider]  enoxaparin (LOVENOX) 40 MG/0.4ML injection Inject 0.4 mLs (40 mg total) into the skin daily. 03/24/17   Irene Limbo, MD  fluticasone (FLONASE) 50 MCG/ACT nasal spray Place 2 sprays daily into both nostrils.    [provider]  Fluticasone-Salmeterol (ADVAIR) 250-50 MCG/DOSE AEPB Inhale 1 puff daily as needed into the lungs (shortness of breath).     [provider]  HYDROcodone-acetaminophen (NORCO) 5-325 MG tablet Take 1 tablet by mouth every 4 (four) hours as needed for moderate pain. 03/24/17   Irene Limbo, MD  ibuprofen (ADVIL,MOTRIN) 200 MG tablet Take 800 mg 2 (two) times daily as needed by mouth for moderate pain.    [provider]  insulin glargine (LANTUS) 100 UNIT/ML injection Inject 10 Units into the skin every morning.    [provider]  levothyroxine (SYNTHROID) 200 MCG tablet Take  200 mcg every evening by mouth.     [provider]  MELATONIN PO Take 1 tablet at bedtime as needed by mouth (sleep).    [provider]  metFORMIN (GLUCOPHAGE) 500 MG tablet Take 500 mg 2 (two) times daily with a meal by mouth.    [provider]  Multiple Vitamin (MULTIVITAMIN WITH MINERALS) TABS tablet Take 1 tablet daily by mouth.    [provider]  nabumetone (RELAFEN) 500 MG tablet Take 500 mg daily by mouth.    [provider]  pantoprazole (PROTONIX) 40 MG tablet Take 40 mg daily by mouth.    [provider]  SUPER B COMPLEX/C CAPS Take 1 tablet daily by mouth.    [provider]    Family History    Family History  Problem Relation Age of Onset  . Heart disease Mother   . Cancer Father        colon  . Heart disease Father    indicated that his mother is deceased. He indicated that his father is deceased.   Social History    Social History   Socioeconomic History  . Marital status: Married    Spouse name: Not on file  . Number of children: Not on file  . Years of education: Not on file  . Highest education level: Not on file  Occupational History  . Not on file  Social Needs  . Financial resource strain: Not on file  . Food insecurity:    Worry: Not on file    Inability: Not on file  . Transportation needs:    Medical: Not on file    Non-medical: Not on file  Tobacco Use  . Smoking status: Former Smoker    Types: Cigars  . Smokeless tobacco: Never Used  Substance and Sexual Activity  . Alcohol use: No  . Drug use: No  . Sexual activity: Not on file  Lifestyle  . Physical activity:    Days per week: Not on file    Minutes per session: Not on file  . Stress: Not on file  Relationships  . Social connections:    Talks on phone: Not on file    Gets together: Not on file    Attends religious service: Not on file    Active member of club or organization: Not on file    Attends meetings of clubs or organizations: Not on file    Relationship status: Not on file  . Intimate partner violence:    Fear of current or ex partner: Not on file    Emotionally abused: Not on file    Physically abused: Not on file    Forced sexual activity: Not on file  Other Topics Concern  . Not on file  Social History Narrative  . Not on file     Review of Systems    General:  No chills, fever, night sweats or weight changes.  Cardiovascular:  No chest pain, dyspnea on exertion, edema, orthopnea, palpitations, paroxysmal nocturnal dyspnea. Dermatological: No rash, lesions/masses Respiratory: No cough, dyspnea Urologic: No hematuria,  dysuria Abdominal:   No nausea, vomiting, diarrhea, bright red blood per rectum, melena, or hematemesis Neurologic:  No visual changes, wkns, changes in mental status. All other systems reviewed and are otherwise negative except as noted above.  Physical Exam    Blood pressure (!) 165/71, pulse 86, temperature 98.1 F (36.7 C), temperature source Oral, resp. rate 18, height 6\' 2"  (1.88 m), weight  81.6 kg (180 lb), SpO2 98 %.  General: Pleasant, NAD Psych: Normal affect. Neuro: Alert and oriented X 3. Moves all extremities spontaneously. HEENT: Normal  Neck: Supple without bruits or JVD. Lungs:  Resp regular and unlabored, CTA. Heart: RRR no s3, s4, or murmurs. Abdomen: Soft, non-tender, non-distended, BS + x 4.  Extremities: No clubbing, cyanosis or edema. DP/PT/Radials 2+ and equal bilaterally.  Labs    Troponin [facility] of Care Test) Recent Labs    09/27/17 0104  TROPIPOC 1.27*   No results for input(s): CKTOTAL, CKMB, TROPONINI in the last 72 hours. Lab Results  Component Value Date   WBC 7.7 09/27/2017   HGB 14.3 09/27/2017   HCT 41.9 09/27/2017   MCV 91.5 09/27/2017   PLT 225 09/27/2017    Recent Labs  Lab 09/27/17 0045  NA 140  K 3.8  CL 103  CO2 28  BUN 26*  CREATININE 1.21  CALCIUM 9.4  GLUCOSE 139*   No results found for: CHOL, HDL, LDLCALC, TRIG No results found for: [facility]   Radiology Studies    Dg Chest 2 View  Result Date: 09/27/2017 CLINICAL DATA:  Intermittent chest pain, nausea for 1 week. History of prostate cancer. EXAM: CHEST - 2 VIEW COMPARISON:  Chest radiograph November 03, 2004 FINDINGS: Cardiomediastinal silhouette is normal. No pleural effusions or focal consolidations. Mild bronchitic changes. Trachea projects midline and there is no pneumothorax. Soft tissue planes and included osseous structures are non-suspicious. Surgical clips in the included right abdomen compatible with cholecystectomy. Mild degenerative change of the thoracic spine.  IMPRESSION: Mild bronchitic changes.  No focal consolidation. Electronically Signed   By: Elon Alas M.D.   On: 09/27/2017 01:14    ECG & Cardiac Imaging    Normal sinus rhythm with ST changes suggestive of ischemia in lead III, aVL, aVF  Assessment & Plan    NSTEMI: Elevated troponin, abnormal EKG, symptoms.  His symptoms currently subsided.  Patient already received morphine and was started on heparin and aspirin by the emergency room.  Plan: -Admission to cardiology, with a plan for left heart catheter morning. -Continue on heparin, aspirin, started on atorvastatin 80. -Started on metoprolol low-dose. -Continue on insulin but hold hold metformin -Hold home NSAIDs.  Patient has been taking 2 different types for the last few years.  Signed, Cristina Gong, MD 09/27/2017, 2:46 AM

## 2017-09-27 NOTE — Progress Notes (Signed)
  Echocardiogram 2D Echocardiogram has been performed.  Dan Lester 09/27/2017, 11:33 AM

## 2017-09-28 ENCOUNTER — Inpatient Hospital Stay (HOSPITAL_COMMUNITY): Payer: 59

## 2017-09-28 ENCOUNTER — Encounter (HOSPITAL_COMMUNITY): Payer: Self-pay | Admitting: Interventional Cardiology

## 2017-09-28 ENCOUNTER — Other Ambulatory Visit: Payer: Self-pay | Admitting: *Deleted

## 2017-09-28 DIAGNOSIS — I2510 Atherosclerotic heart disease of native coronary artery without angina pectoris: Secondary | ICD-10-CM

## 2017-09-28 DIAGNOSIS — I214 Non-ST elevation (NSTEMI) myocardial infarction: Secondary | ICD-10-CM

## 2017-09-28 DIAGNOSIS — I25110 Atherosclerotic heart disease of native coronary artery with unstable angina pectoris: Secondary | ICD-10-CM

## 2017-09-28 LAB — URINALYSIS, ROUTINE W REFLEX MICROSCOPIC
BILIRUBIN URINE: NEGATIVE
Glucose, UA: 50 mg/dL — AB
Hgb urine dipstick: NEGATIVE
KETONES UR: NEGATIVE mg/dL
Leukocytes, UA: NEGATIVE
NITRITE: NEGATIVE
Protein, ur: NEGATIVE mg/dL
Specific Gravity, Urine: 1.017 (ref 1.005–1.030)
pH: 5 (ref 5.0–8.0)

## 2017-09-28 LAB — GLUCOSE, CAPILLARY
Glucose-Capillary: 137 mg/dL — ABNORMAL HIGH (ref 65–99)
Glucose-Capillary: 167 mg/dL — ABNORMAL HIGH (ref 65–99)
Glucose-Capillary: 187 mg/dL — ABNORMAL HIGH (ref 65–99)
Glucose-Capillary: 147 mg/dL — ABNORMAL HIGH (ref 65–99)

## 2017-09-28 LAB — CBC
HEMATOCRIT: 35.8 % — AB (ref 39.0–52.0)
Hemoglobin: 12.2 g/dL — ABNORMAL LOW (ref 13.0–17.0)
MCH: 31.8 pg (ref 26.0–34.0)
MCHC: 34.1 g/dL (ref 30.0–36.0)
MCV: 93.2 fL (ref 78.0–100.0)
Platelets: 181 10*3/uL (ref 150–400)
RBC: 3.84 MIL/uL — AB (ref 4.22–5.81)
RDW: 12 % (ref 11.5–15.5)
WBC: 7.1 10*3/uL (ref 4.0–10.5)

## 2017-09-28 LAB — BASIC METABOLIC PANEL
ANION GAP: 8 (ref 5–15)
BUN: 24 mg/dL — ABNORMAL HIGH (ref 6–20)
CALCIUM: 8.6 mg/dL — AB (ref 8.9–10.3)
CO2: 26 mmol/L (ref 22–32)
Chloride: 102 mmol/L (ref 101–111)
Creatinine, Ser: 1.11 mg/dL (ref 0.61–1.24)
GLUCOSE: 198 mg/dL — AB (ref 65–99)
POTASSIUM: 4.1 mmol/L (ref 3.5–5.1)
Sodium: 136 mmol/L (ref 135–145)

## 2017-09-28 LAB — PULMONARY FUNCTION TEST
FEF 25-75 Post: 2.24 L/sec
FEF 25-75 Pre: 1.99 L/sec
FEF2575-%Change-Post: 12 %
FEF2575-%Pred-Post: 77 %
FEF2575-%Pred-Pre: 69 %
FEV1-%Change-Post: 3 %
FEV1-%Pred-Post: 81 %
FEV1-%Pred-Pre: 78 %
FEV1-Post: 3.08 L
FEV1-Pre: 2.98 L
FEV1FVC-%Change-Post: 1 %
FEV1FVC-%Pred-Pre: 96 %
FEV6-%Change-Post: 0 %
FEV6-%Pred-Post: 85 %
FEV6-%Pred-Pre: 84 %
FEV6-Post: 4.14 L
FEV6-Pre: 4.1 L
FEV6FVC-%Change-Post: 0 %
FEV6FVC-%Pred-Post: 102 %
FEV6FVC-%Pred-Pre: 103 %
FVC-%Change-Post: 1 %
FVC-%Pred-Post: 83 %
FVC-%Pred-Pre: 81 %
FVC-Post: 4.26 L
FVC-Pre: 4.18 L
Post FEV1/FVC ratio: 72 %
Post FEV6/FVC ratio: 97 %
Pre FEV1/FVC ratio: 71 %
Pre FEV6/FVC Ratio: 98 %

## 2017-09-28 LAB — HEPARIN LEVEL (UNFRACTIONATED)
Heparin Unfractionated: 0.19 IU/mL — ABNORMAL LOW (ref 0.30–0.70)
Heparin Unfractionated: 0.37 IU/mL (ref 0.30–0.70)

## 2017-09-28 LAB — SURGICAL PCR SCREEN
MRSA, PCR: NEGATIVE
Staphylococcus aureus: NEGATIVE

## 2017-09-28 MED ORDER — MOVING RIGHT ALONG BOOK
Freq: Once | Status: AC
  Administered 2017-09-28: 1
  Filled 2017-09-28: qty 1

## 2017-09-28 MED ORDER — ALBUTEROL SULFATE (2.5 MG/3ML) 0.083% IN NEBU
2.5000 mg | INHALATION_SOLUTION | Freq: Once | RESPIRATORY_TRACT | Status: AC
  Administered 2017-09-28: 2.5 mg via RESPIRATORY_TRACT

## 2017-09-28 MED ORDER — INSULIN ASPART 100 UNIT/ML ~~LOC~~ SOLN
0.0000 [IU] | Freq: Three times a day (TID) | SUBCUTANEOUS | Status: DC
  Administered 2017-09-29: 2 [IU] via SUBCUTANEOUS
  Administered 2017-09-29 – 2017-09-30 (×3): 1 [IU] via SUBCUTANEOUS

## 2017-09-28 MED ORDER — ~~LOC~~ CARDIAC SURGERY, PATIENT & FAMILY EDUCATION
Freq: Once | Status: AC
  Administered 2017-09-28: 21:00:00 1
  Filled 2017-09-28: qty 1

## 2017-09-28 MED ORDER — INSULIN ASPART 100 UNIT/ML ~~LOC~~ SOLN: 0.0000 [IU] | Freq: Every day | SUBCUTANEOUS | Status: DC

## 2017-09-28 MED FILL — Heparin Sod (Porcine)-NaCl IV Soln 1000 Unit/500ML-0.9%: INTRAVENOUS | Qty: 1000 | Status: AC

## 2017-09-28 NOTE — Progress Notes (Signed)
Discussed sternal precautions, IS (1250 mL), mobility, d/c planning. Good reception. Gave materials and he will watch preop video with wife when she arrives. His wife is planning on taking time off work (she is Therapist, sports). We will hold ambulation due to pt having light chest pressure. Tamms CES, ACSM 2:36 PM 09/28/2017

## 2017-09-28 NOTE — Progress Notes (Addendum)
Progress Note  Patient Name: Dan Lester Date of Encounter: 09/28/2017  Primary Cardiologist: Dr. Acie Fredrickson- New  Subjective   Pt feeling well this AM. Continues to have 1-2/10 chest pain. Right radial cath site unremarkable   Inpatient Medications    Scheduled Meds: . aspirin EC  81 mg Oral Daily  . atorvastatin  80 mg Oral q1800  . buPROPion  150 mg Oral Daily  . insulin glargine  10 Units Subcutaneous q morning - 10a  . levothyroxine  200 mcg Oral QAC breakfast  . metoprolol tartrate  12.5 mg Oral BID  . mometasone-formoterol  2 puff Inhalation BID  . pantoprazole  40 mg Oral Daily  . sodium chloride flush  3 mL Intravenous Q12H   Continuous Infusions: . sodium chloride    . heparin 1,100 Units/hr (09/28/17 0650)  . nitroGLYCERIN 15 mcg/min (09/27/17 1401)   PRN Meds: sodium chloride, acetaminophen, Melatonin, nitroGLYCERIN, ondansetron (ZOFRAN) IV, oxyCODONE, sodium chloride flush, zolpidem   Vital Signs    Vitals:   09/27/17 1900 09/27/17 2000 09/27/17 2001 09/28/17 0626  BP: 113/66 128/73 128/73 120/60  Pulse:   73 67  Resp: 17 17 (!) 22 13  Temp:   98.3 F (36.8 C) (!) 97.2 F (36.2 C)  TempSrc:   Oral Oral  SpO2: 95% 97% 95% 95%  Weight:    180 lb 5.4 oz (81.8 kg)  Height:        Intake/Output Summary (Last 24 hours) at 09/28/2017 0745 Last data filed at 09/28/2017 0636 Gross per 24 hour  Intake 240 ml  Output 300 ml  Net -60 ml   Filed Weights   09/27/17 0456 09/27/17 0956 09/28/17 0626  Weight: 179 lb 14.3 oz (81.6 kg) 179 lb 15.7 oz (81.6 kg) 180 lb 5.4 oz (81.8 kg)   Physical Exam   General: Well developed, well nourished, NAD Skin: Warm, dry, intact  Head: Normocephalic, atraumatic, clear, moist mucus membranes. Neck: Negative for carotid bruits. No JVD Lungs:Clear to ausculation bilaterally. No wheezes, rales, or rhonchi. Breathing is unlabored. Cardiovascular: RRR with S1 S2. No murmurs, rubs or gallops Abdomen: Soft, non-tender,  non-distended with normoactive bowel sounds.No obvious abdominal masses. MSK: Strength and tone appear normal for age. 5/5 in all extremities Extremities: No edema. No clubbing or cyanosis. DP/PT pulses 2+ bilaterally Neuro: Alert and oriented. No focal deficits. No facial asymmetry. MAE spontaneously. Psych: Responds to questions appropriately with normal affect.    Labs    Chemistry Recent Labs  Lab 09/27/17 0045  NA 140  K 3.8  CL 103  CO2 28  GLUCOSE 139*  BUN 26*  CREATININE 1.21  CALCIUM 9.4  GFRNONAA 59*  GFRAA >60  ANIONGAP 9     Hematology Recent Labs  Lab 09/27/17 0045 09/28/17 0559  WBC 7.7 7.1  RBC 4.58 3.84*  HGB 14.3 12.2*  HCT 41.9 35.8*  MCV 91.5 93.2  MCH 31.2 31.8  MCHC 34.1 34.1  RDW 11.9 12.0  PLT 225 181    Cardiac Enzymes Recent Labs  Lab 09/27/17 0726 09/27/17 1233 09/27/17 1845  TROPONINI 2.49* 4.48* 6.25*    Recent Labs  Lab 09/27/17 0104  TROPIPOC 1.27*     BNPNo results for input(s): BNP, PROBNP in the last 168 hours.   DDimer No results for input(s): DDIMER in the last 168 hours.   Radiology    Dg Chest 2 View  Result Date: 09/27/2017 CLINICAL DATA:  Intermittent chest pain, nausea for 1  week. History of prostate cancer. EXAM: CHEST - 2 VIEW COMPARISON:  Chest radiograph November 03, 2004 FINDINGS: Cardiomediastinal silhouette is normal. No pleural effusions or focal consolidations. Mild bronchitic changes. Trachea projects midline and there is no pneumothorax. Soft tissue planes and included osseous structures are non-suspicious. Surgical clips in the included right abdomen compatible with cholecystectomy. Mild degenerative change of the thoracic spine. IMPRESSION: Mild bronchitic changes.  No focal consolidation. Electronically Signed   By: Elon Alas M.D.   On: 09/27/2017 01:14   Telemetry    09/28/2017: NSR HR 67- Personally Reviewed  ECG    09/28/2017: NSR HR 64 with first-degree AV block- Personally  Reviewed  Cardiac Studies   Echocardiogram 09/27/2016:  Study Conclusions  - Left ventricle: The cavity size was normal. There was mild focal   basal hypertrophy of the septum. Systolic function was normal.   The estimated ejection fraction was in the range of 55% to 60%.   Possible mild hypokinesis of the inferior myocardium. There was   an increased relative contribution of atrial contraction to   ventricular filling. Doppler parameters are consistent with   abnormal left ventricular relaxation (grade 1 diastolic   dysfunction). - Mitral valve: Calcified annulus.  Cardiac catheterization 09/27/2017:   Severe multivessel coronary artery disease.  Normal left main.  Proximal LAD beyond the first diagonal 95%.  Mid 50% stenosis.  The first diagonal contains a subtotally occluded branch with TIMI grade II flow and 80% stenosis of the larger branch.  The circumflex coronary artery is diffusely diseased with greater than 90% obstruction in the first marginal and 95% stenosis in the continuation of the circumflex to the second obtuse marginal.  High-grade segmental 90% stenosis in the mid right coronary.  PDA contains 70% obstruction.  Overall LV function is lower normal with an estimated EF of 50%.  LVEDP is 21 mmHg.  Findings compatible with chronic diastolic heart failure.  RECOMMENDATIONS:   Given the diffuse nature of the disease in this diabetic, will recommend evaluation by cardiac surgery to determine if he is a candidate for surgical revascularization.  Patient Profile     72 y.o. male admitted with chest pain and found to have a non-ST segment elevation myocardial infarction.  Assessment & Plan    1. NSTEMI with severe three-vessel CAD: -Cardiac catheterization completed 09/28/2017 which revealed severe three-vessel disease with normal LM. TCTS surgery to evaluate for possible surgical revascularization -Echocardiogram 09/27/2017 with LVEF 55 to 60% with mild hypokinesis  of the inferior myocardium and G1DD -Continues to have mild chest discomfort>>>continue NTG gtt -Continue heparin gtt until surgical evaluation -Continue ASA, high intensity statin, metoprolol -EKG unremarkable, today -Troponin, 2.49, 4.48, 6.25  2.  DM2: -Stable, Lantus -If surgery, SSI for glucose control  3.  Chronic diastolic dysfunction: -Per echocardiogram 09/27/2017 with normal LV function with G1DD -Patient asymptomatic  4.  Hyperthyroidism: -TSH, 0.080, with T4 1.88 -We will need follow-up with PCP posthospitalization  Signed, Kathyrn Drown NP-C [facility] Pager: [phone] 09/28/2017, 7:45 AM     For questions or updates, please contact   Please consult [email or web address] for contact info under Cardiology/STEMI.  Attending Note:   The patient was seen and examined.  Agree with assessment and plan as noted above.  Changes made to the above note as needed.  Patient seen and independently examined with Kathyrn Drown, NP .   We discussed all aspects of the encounter. I agree with the assessment and plan as stated above.  1.  Coronary  artery disease: I personally reviewed his coronary angiograms.  He has severe three-vessel coronary artery disease.  TCT S has been consulted for bypass surgery.  He has good targets for grafting. Still having some mild chest pain.   I have spent a total of 40 minutes with patient reviewing hospital  notes , telemetry, EKGs, labs and examining patient as well as establishing an assessment and plan that was discussed with the patient. > 50% of time was spent in direct patient care.    Thayer Headings, Brooke Bonito., MD, [facility] 09/28/2017, 10:15 AM [address]  [place] Pager [phone]

## 2017-09-28 NOTE — Progress Notes (Signed)
[address]       [place] [address]             [phone]        Dan Lester Medical Record #[id number] Date of Birth: [birth year]  Referring: No ref. provider found Primary Care: Burnard Bunting, MD Primary Cardiologist:No primary care provider on file.  Chief Complaint:    Chief Complaint  Patient presents with  . Chest Pain    History of Present Illness:     Patient is a 72 year old male with long history of diabetes.  He presented to the emergency room yesterday after several weeks of progressive chest discomfort and shortness of breath.  The patient notes that he has had a lifelong history of asthma, he initially thought that the symptoms were his asthma and used bronchodilators.  The day prior to admission and on the day of admission he had shortness of breath associated with midsternal discomfort that radiated into the elbows both arms.  He was admitted through the emergency room yesterday and underwent cardiac catheterization by Dr. Tamala Julian yesterday.  Was asked today to see the patient for consideration of coronary artery bypass graft  He has had no previous cardiac history  Current Activity/ Functional Status: Patient is independent with mobility/ambulation, transfers, ADL's, IADL's.   Zubrod Score: At the time of surgery this patient's most appropriate activity status/level should be described as: []     0    Normal activity, no symptoms [x]     1    Restricted in physical strenuous activity but ambulatory, able to do out light work []     2    Ambulatory and capable of self care, unable to do work activities, up and about                 more than 50%  Of the time                            []     3    Only limited self care, in bed greater than 50% of waking hours []     4    Completely disabled, no self care, confined to bed or chair []     5    Moribund  Past Medical History:  Diagnosis Date  . Arthritis   . Asthma   . Cancer  [facility])    prostate  . Depression   . Diabetes mellitus without complication (HCC)    , diet controlled  . GERD (gastroesophageal reflux disease)   . H/O colonoscopy 03/12/2009  . History of kidney stones   . Hyperlipidemia   . Hypothyroidism   . Inguinal hernia   . Neck pain   . Thyroid disease    hyperthyroidism; surgery changed him to hypo    Past Surgical History:  Procedure Laterality Date  . BACK SURGERY  09/2005  . CHOLECYSTECTOMY    . DEBRIDMENT OF DECUBITUS ULCER Right 08/29/2015   Procedure: COMPLEX REPAIR BUTTOCKS 3CM;  Surgeon: Irene Limbo, MD;  Location: [facility];  Service: Plastics;  Laterality: Right;  . excision of Ischial pressure ulcer Right 03/23/2017  . EYE SURGERY     tumor removed; right  . HERNIA REPAIR    . LEFT HEART CATH AND CORONARY ANGIOGRAPHY N/A 09/27/2017   Procedure: LEFT HEART CATH AND CORONARY ANGIOGRAPHY;  Surgeon: Belva Crome, MD;  Location: [facility];  Service: Cardiovascular;  Laterality: N/A;  . MASS EXCISION Right 03/22/2017   Procedure: EXCISION OF ISCHIAL PRESSURE  ULCER SKIN FLAP CLOSURE;  Surgeon: Irene Limbo, MD;  Location: [facility];  Service: Plastics;  Laterality: Right;  . PROSTATE SURGERY  11/2004   seed implant  . SINUS SURGERY WITH INSTATRAK      Social History   Tobacco Use  Smoking Status Former Smoker  . Types: Cigars  Smokeless Tobacco Never Used    Social History   Substance and Sexual Activity  Alcohol Use No     No Known Allergies  Current Facility-Administered Medications  Medication Dose Route Frequency Provider Last Rate Last Dose  . 0.9 %  sodium chloride infusion  250 mL Intravenous PRN Belva Crome, MD      . acetaminophen (TYLENOL) tablet 650 mg  650 mg Oral Q4H PRN Belva Crome, MD   650 mg at 09/28/17 1105  . aspirin EC tablet 81 mg  81 mg Oral Daily Belva Crome, MD   81 mg at 09/28/17 1055  . atorvastatin (LIPITOR) tablet 80 mg  80 mg Oral q1800 Belva Crome, MD   80 mg at 09/27/17 1735  . buPROPion (WELLBUTRIN XL) 24 hr tablet 150 mg  150 mg Oral Daily Belva Crome, MD   150 mg at 09/28/17 1056  . heparin ADULT infusion 100 units/mL (25000 units/243mL sodium chloride 0.45%)  1,100 Units/hr Intravenous Continuous Erenest Blank, RPH 11 mL/hr at 09/28/17 0650 1,100 Units/hr at 09/28/17 0650  . insulin glargine (LANTUS) injection 10 Units  10 Units Subcutaneous q morning - 10a Belva Crome, MD   10 Units at 09/28/17 1056  . levothyroxine (SYNTHROID, LEVOTHROID) tablet 200 mcg  200 mcg Oral QAC breakfast Belva Crome, MD   200 mcg at 09/28/17 [phone]  . Melatonin TABS 3 mg  3 mg Oral QHS PRN Belva Crome, MD   3 mg at 09/27/17 2203  . metoprolol tartrate (LOPRESSOR) tablet 12.5 mg  12.5 mg Oral BID Belva Crome, MD   12.5 mg at 09/28/17 1056  . mometasone-formoterol (DULERA) 200-5 MCG/ACT inhaler 2 puff  2 puff Inhalation BID Belva Crome, MD   2 puff at 09/28/17 0805  . nitroGLYCERIN (NITROSTAT) SL tablet 0.4 mg  0.4 mg Sublingual Q5 Min x 3 PRN Belva Crome, MD      . nitroGLYCERIN 50 mg in dextrose 5 % 250 mL (0.2 mg/mL) infusion  0-20 mcg/min Intravenous Titrated Belva Crome, MD 4.5 mL/hr at 09/27/17 1401 15 mcg/min at 09/27/17 1401  . ondansetron (ZOFRAN) injection 4 mg  4 mg Intravenous Q6H PRN Belva Crome, MD      . oxyCODONE (Oxy IR/ROXICODONE) immediate release tablet 5-10 mg  5-10 mg Oral Q4H PRN Belva Crome, MD      . pantoprazole (PROTONIX) EC tablet 40 mg  40 mg Oral Daily Belva Crome, MD   40 mg at 09/28/17 1056  . sodium chloride flush (NS) 0.9 % injection 3 mL  3 mL Intravenous Q12H Belva Crome, MD   3 mL at 09/28/17 1057  . sodium chloride flush (NS) 0.9 % injection 3 mL  3 mL Intravenous PRN Belva Crome, MD      . zolpidem Lorrin Mais) tablet 5 mg  5 mg Oral QHS PRN Belva Crome, MD        Medications Prior to Admission  Medication Sig Dispense Refill Last  Dose  . buPROPion (WELLBUTRIN XL) 150 MG 24 hr  tablet Take 150 mg daily by mouth.   09/25/2017 at Unknown time  . fluticasone (FLONASE) 50 MCG/ACT nasal spray Place 2 sprays daily into both nostrils.   09/25/2017 at Unknown time  . Fluticasone-Salmeterol (ADVAIR) 250-50 MCG/DOSE AEPB Inhale 1 puff daily as needed into the lungs (shortness of breath).    Past Week at Unknown time  . ibuprofen (ADVIL,MOTRIN) 200 MG tablet Take 800 mg 2 (two) times daily as needed by mouth for moderate pain.   Past Week at Unknown time  . insulin glargine (LANTUS) 100 UNIT/ML injection Inject 10 Units into the skin every evening.    09/26/2017 at Unknown time  . levothyroxine (SYNTHROID) 200 MCG tablet Take 200 mcg every evening by mouth.    09/26/2017 at Unknown time  . MELATONIN PO Take 1 tablet at bedtime as needed by mouth (sleep).   Past Week at Unknown time  . metFORMIN (GLUCOPHAGE) 500 MG tablet Take 500 mg 2 (two) times daily with a meal by mouth.   09/25/2017 at Unknown time  . Multiple Vitamin (MULTIVITAMIN WITH MINERALS) TABS tablet Take 1 tablet daily by mouth.   09/25/2017 at Unknown time  . pantoprazole (PROTONIX) 40 MG tablet Take 40 mg daily by mouth.   09/25/2017 at Unknown time  . SUPER B COMPLEX/C CAPS Take 1 tablet daily by mouth.   09/25/2017 at Unknown time    Family History  Problem Relation Age of Onset  . Heart disease Mother   . Cancer Father        colon  . Heart disease Father      Review of Systems:   ROS     Cardiac Review of Systems: Y or  [    ]= no  Chest Pain [  y  ]  Resting SOB [  y ] Exertional SOB  [ y ]  Orthopnea [ n ]   Pedal Edema [ n  ]    Palpitations [ n ] Syncope  [ n ]   Presyncope [n   ]  General Review of Systems: [Y] = yes [  ]=no Constitional: recent weight change [  ]; anorexia [  ]; fatigue [ y ]; nausea [  ]; night sweats [  ]; fever [  ]; or chills [  ]                                                               Dental: Last Dentist visit:   Eye : blurred vision [  ]; diplopia [   ]; vision changes [  ];   Amaurosis fugax[  ]; Resp: cough [  ];  wheezing[y  ];  hemoptysis[  ]; shortness of breath[  ]; paroxysmal nocturnal dyspnea[  ]; dyspnea on exertion[y  ]; or orthopnea[  ];  GI:  gallstones[  ], vomiting[  ];  dysphagia[  ]; melena[  ];  hematochezia [  ]; heartburn[  ];   Hx of  Colonoscopy[  ]; GU: kidney stones [  ]; hematuria[  ];   dysuria [  ];  nocturia[  ];  history of     obstruction [  y]; urinary frequency [ y ]  Skin: rash, swelling[  ];, hair loss[  ];  peripheral edema[  ];  or itching[  ]; Musculosketetal: myalgias[  ];  joint swelling[  ];  joint erythema[  ];  joint pain[  ];  back pain[  ];  Heme/Lymph: bruising[  ];  bleeding[  ];  anemia[  ];  Neuro: TIA[  ];  headaches[  ];  stroke[  ];  vertigo[  ];  seizures[  ];   paresthesias[  ];  difficulty walking[  ];  Psych:depression[  ]; anxiety[  ];  Endocrine: diabetes[y  ];  thyroid dysfunction[  ];               Physical Exam: BP 114/79 (BP Location: Right Arm)   Pulse 71   Temp 97.6 F (36.4 C)   Resp 17   Ht 6\' 2"  (1.88 m)   Wt 180 lb 5.4 oz (81.8 kg)   SpO2 95%   BMI 23.15 kg/m    General appearance: alert, cooperative, appears stated age and no distress Head: Normocephalic, without obvious abnormality, atraumatic Neck: no adenopathy, no carotid bruit, no JVD, supple, symmetrical, trachea midline and thyroid not enlarged, symmetric, no tenderness/mass/nodules Lymph nodes: Cervical, supraclavicular, and axillary nodes normal. Resp: clear to auscultation bilaterally Back: symmetric, no curvature. ROM normal. No CVA tenderness. Cardio: regular rate and rhythm, S1, S2 normal, no murmur, click, rub or gallop GI: soft, non-tender; bowel sounds normal; no masses,  no organomegaly Extremities: extremities normal, atraumatic, no cyanosis or edema and Homans sign is negative, no sign of DVT Neurologic: Grossly normal Healed area right buttocks from his previous decubitus repair Appears to have adequate  vein for bypass in both lower extremities DP and PT pulses are present bilaterally  Diagnostic Studies & Laboratory data:     Recent Radiology Findings:   Dg Chest 2 View  Result Date: 09/27/2017 CLINICAL DATA:  Intermittent chest pain, nausea for 1 week. History of prostate cancer. EXAM: CHEST - 2 VIEW COMPARISON:  Chest radiograph November 03, 2004 FINDINGS: Cardiomediastinal silhouette is normal. No pleural effusions or focal consolidations. Mild bronchitic changes. Trachea projects midline and there is no pneumothorax. Soft tissue planes and included osseous structures are non-suspicious. Surgical clips in the included right abdomen compatible with cholecystectomy. Mild degenerative change of the thoracic spine. IMPRESSION: Mild bronchitic changes.  No focal consolidation. Electronically Signed   By: Elon Alas M.D.   On: 09/27/2017 01:14     I have independently reviewed the above radiologic studies and discussed with the patient   Recent Lab Findings: Lab Results  Component Value Date   WBC 7.1 09/28/2017   HGB 12.2 (L) 09/28/2017   HCT 35.8 (L) 09/28/2017   PLT 181 09/28/2017   GLUCOSE 198 (H) 09/28/2017   ALT 19 03/22/2017   AST 20 03/22/2017   NA 136 09/28/2017   K 4.1 09/28/2017   CL 102 09/28/2017   CREATININE 1.11 09/28/2017   BUN 24 (H) 09/28/2017   CO2 26 09/28/2017   TSH 0.080 (L) 09/27/2017   INR 1.07 09/27/2017   HGBA1C 8.8 (H) 03/03/2017   Lab Results  Component Value Date   TROPONINI 6.25 ([facility]) 09/27/2017   Cath: Belva Crome, MD (Primary)    Procedures   LEFT HEART CATH AND CORONARY ANGIOGRAPHY  Conclusion    Severe multivessel coronary artery disease.  Normal left main.  Proximal LAD beyond the first diagonal 95%.  Mid 50% stenosis.  The first diagonal contains a subtotally occluded  branch with TIMI grade II flow and 80% stenosis of the larger branch.  The circumflex coronary artery is diffusely diseased with greater than 90% obstruction in  the first marginal and 95% stenosis in the continuation of the circumflex to the second obtuse marginal.  High-grade segmental 90% stenosis in the mid right coronary.  PDA contains 70% obstruction.  Overall LV function is lower normal with an estimated EF of 50%.  LVEDP is 21 mmHg.  Findings compatible with chronic diastolic heart failure.  RECOMMENDATIONS:   Given the diffuse nature of the disease in this diabetic, will recommend evaluation by cardiac surgery to determine if he is a candidate for surgical revascularization   ECHO: Transthoracic Echocardiography  Patient:    Dan, Lester MR #:       [id number] Study Date: 09/27/2017 Gender:     M Age:        50 Height:     188 cm Weight:     81.6 kg BSA:        2.06 m^2 Pt. Status: Room:       [place], MD  ATTENDING    Sanda Klein, MD  SONOGRAPHER  Johny Chess, RDCS, CCT  PERFORMING   [facility], Inpatient  ORDERING     Fudim, Marat  cc:  ------------------------------------------------------------------- LV EF: 55% -   60%  ------------------------------------------------------------------- Indications:      Chest pain 786.51.  ------------------------------------------------------------------- History:   Risk factors:  Diabetes mellitus. Dyslipidemia.  ------------------------------------------------------------------- Study Conclusions  - Left ventricle: The cavity size was normal. There was mild focal   basal hypertrophy of the septum. Systolic function was normal.   The estimated ejection fraction was in the range of 55% to 60%.   Possible mild hypokinesis of the inferior myocardium. There was   an increased relative contribution of atrial contraction to   ventricular filling. Doppler parameters are consistent with   abnormal left ventricular relaxation (grade 1 diastolic   dysfunction). - Mitral valve: Calcified  annulus.  ------------------------------------------------------------------- Study data:  No prior study was available for comparison.  Study status:  Routine.  Procedure:  The patient reported no pain pre or post test. Transthoracic echocardiography. Image quality was good. Study completion:  There were no complications. Transthoracic echocardiography.  M-mode, complete 2D, spectral Doppler, and color Doppler.  Birthdate:  Patient birthdate: 1945/11/08.  Age:  Patient is 72 yr old.  Sex:  Gender: male. BMI: 23.1 kg/m^2.  Blood pressure:     129/74  Patient status: Inpatient.  Study date:  Study date: 09/27/2017. Study time: 11:01 AM.  Location:  Echo laboratory.  -------------------------------------------------------------------  ------------------------------------------------------------------- Left ventricle:  The cavity size was normal. There was mild focal basal hypertrophy of the septum. Systolic function was normal. The estimated ejection fraction was in the range of 55% to 60%. Regional wall motion abnormalities:   Possible mild hypokinesis of the inferior myocardium. There was an increased relative contribution of atrial contraction to ventricular filling. Doppler parameters are consistent with abnormal left ventricular relaxation (grade 1 diastolic dysfunction).  ------------------------------------------------------------------- Aortic valve:   Trileaflet; normal thickness leaflets. Mobility was not restricted.  Doppler:  Transvalvular velocity was within the normal range. There was no stenosis. There was no regurgitation.   ------------------------------------------------------------------- Aorta:  Aortic root: The aortic root was normal in size.  ------------------------------------------------------------------- Mitral valve:   Calcified annulus. Mobility was not restricted. Doppler:  Transvalvular velocity was within the normal range. There was no evidence  for stenosis. There was no regurgitation.  ------------------------------------------------------------------- Left atrium:  The atrium was normal in size.  ------------------------------------------------------------------- Right ventricle:  The cavity size was normal. Wall thickness was normal. Systolic function was normal.  ------------------------------------------------------------------- Pulmonic valve:    Structurally normal valve.   Cusp separation was normal.  Doppler:  Transvalvular velocity was within the normal range. There was no evidence for stenosis. There was no regurgitation.  ------------------------------------------------------------------- Tricuspid valve:   Structurally normal valve.    Doppler: Transvalvular velocity was within the normal range. There was trivial regurgitation.  ------------------------------------------------------------------- Pulmonary artery:   The main pulmonary artery was normal-sized. Systolic pressure was within the normal range.  ------------------------------------------------------------------- Right atrium:  The atrium was normal in size.  ------------------------------------------------------------------- Pericardium:  There was no pericardial effusion.  ------------------------------------------------------------------- Systemic veins: Inferior vena cava: The vessel was normal in size.  ------------------------------------------------------------------- Measurements   Left ventricle                           Value        Reference  LV ID, ED, PLAX chordal                  47    mm     43 - 52  LV ID, ES, PLAX chordal          (H)     39    mm     23 - 38  LV fx shortening, PLAX chordal   (L)     17    %      >=29  LV PW thickness, ED                      10    mm     ----------  IVS/LV PW ratio, ED                      0.9          <=1.3  Stroke volume, 2D                        75    ml     ----------  Stroke  volume/bsa, 2D                    36    ml/m^2 ----------  LV ejection fraction, 1-p A4C            44    %      ----------  LV end-diastolic volume, 2-p             100   ml     ----------  LV end-systolic volume, 2-p              57    ml     ----------  LV ejection fraction, 2-p                43    %      ----------  Stroke volume, 2-p                       43    ml     ----------  LV end-diastolic volume/bsa, 2-p         48    ml/m^2 ----------  LV end-systolic volume/bsa, 2-p  28    ml/m^2 ----------  Stroke volume/bsa, 2-p                   20.9  ml/m^2 ----------  LV e&', lateral                           7.94  cm/s   ----------  LV E/e&', lateral                         7.63         ----------  LV e&', medial                            5.55  cm/s   ----------  LV E/e&', medial                          10.92        ----------  LV e&', average                           6.75  cm/s   ----------  LV E/e&', average                         8.98         ----------    Ventricular septum                       Value        Reference  IVS thickness, ED                        9     mm     ----------    LVOT                                     Value        Reference  LVOT ID, S                               21    mm     ----------  LVOT area                                3.46  cm^2   ----------  LVOT peak velocity, S                    111   cm/s   ----------  LVOT mean velocity, S                    67    cm/s   ----------  LVOT VTI, S                              21.6  cm     ----------  LVOT peak gradient, S                    5     mm Hg  ----------  Aorta                                    Value        Reference  Aortic root ID, ED                       36    mm     ----------    Left atrium                              Value        Reference  LA ID, A-P, ES                           37    mm     ----------  LA ID/bsa, A-P                           1.79  cm/m^2 <=2.2  LA  volume, S                             53.8  ml     ----------  LA volume/bsa, S                         26.1  ml/m^2 ----------  LA volume, ES, 1-p A4C                   47.3  ml     ----------  LA volume/bsa, ES, 1-p A4C               22.9  ml/m^2 ----------  LA volume, ES, 1-p A2C                   59.3  ml     ----------  LA volume/bsa, ES, 1-p A2C               28.7  ml/m^2 ----------    Mitral valve                             Value        Reference  Mitral E-wave peak velocity              60.6  cm/s   ----------  Mitral A-wave peak velocity              114   cm/s   ----------  Mitral deceleration time         (H)     238   ms     150 - 230  Mitral E/A ratio, peak                   0.5          ----------    Pulmonary arteries                       Value        Reference  PA pressure, S, DP  26    mm Hg  <=30    Tricuspid valve                          Value        Reference  Tricuspid regurg peak velocity           242   cm/s   ----------  Tricuspid peak RV-RA gradient            23    mm Hg  ----------    Right atrium                             Value        Reference  RA ID, S-I, ES, A4C                      45.8  mm     34 - 49  RA area, ES, A4C                         12.4  cm^2   8.3 - 19.5  RA volume, ES, A/L                       27.7  ml     ----------  RA volume/bsa, ES, A/L                   13.4  ml/m^2 ----------    Systemic veins                           Value        Reference  Estimated CVP                            3     mm Hg  ----------    Right ventricle                          Value        Reference  TAPSE                                    17.1  mm     ----------  RV pressure, S, DP                       26    mm Hg  <=30  RV s&', lateral, S                        13.7  cm/s   ----------  Legend: (L)  and  (H)  mark values outside specified reference  range.  ------------------------------------------------------------------- Prepared and Electronically Authenticated by  Fransico Him, MD 2019-06-04T13:22:28   Assessment / Plan:      Acute non-STEMI myocardial infarction yesterday-three-vessel coronary artery disease History of nonhealing ulcer requiring flap on the right buttocks last year Long-standing diabetes with complications History of prostate cancer more than 10 years ago treated with seed implants  Plan to proceed with coronary artery bypass grafting on this admission.  The cardiac cath films were reviewed with the patient and  his wife in detail.  Risks and options of surgery were discussed with him in detail.  He is willing to proceed. With his presentation we will plan to proceed with bypass surgery on this admission.  The goals risks and alternatives of the planned surgical procedure coronary artery bypass grafting have been discussed with the patient in detail. The risks of the procedure including death, infection, stroke, myocardial infarction, bleeding, blood transfusion have all been discussed specifically.  I have quoted Dan Lester a 3 % of perioperative mortality and a complication rate as high as 40 %. The patient's questions have been answered.Dan Lester is willing  to proceed with the planned procedure.  Planned surgery on Friday, June 7  [name] MD      [place] [place] [address] Office [phone]   Murlean Hark [phone]  09/28/2017 11:36 AM     Patient ID: Dan Lester, male   DOB: 1945-06-13, 72 y.o.   MRN: [id number]

## 2017-09-28 NOTE — Plan of Care (Signed)
  Problem: Education: Goal: Knowledge of General Education information will improve Outcome: Progressing   Problem: Clinical Measurements: Goal: Will remain free from infection Outcome: Progressing Goal: Diagnostic test results will improve Outcome: Progressing Goal: Cardiovascular complication will be avoided Outcome: Progressing   Problem: Pain Managment: Goal: General experience of comfort will improve Outcome: Progressing   Problem: Safety: Goal: Ability to remain free from injury will improve Outcome: Progressing   Problem: Education: Goal: Understanding of cardiac disease, CV risk reduction, and recovery process will improve Outcome: Progressing Goal: Understanding of medication regimen will improve Outcome: Progressing   Problem: Activity: Goal: Ability to tolerate increased activity will improve Outcome: Progressing   Problem: Cardiac: Goal: Vascular access site(s) Level 0-1 will be maintained Outcome: Progressing   Problem: Urinary Elimination: Goal: Ability to achieve and maintain adequate renal perfusion and functioning will improve Outcome: Progressing

## 2017-09-28 NOTE — Progress Notes (Signed)
ANTICOAGULATION CONSULT NOTE   Pharmacy Consult for Heparin Indication: chest pain/ACS, multivessel CAD  No Known Allergies  Patient Measurements: Height: 6\' 2"  (188 cm) Weight: 180 lb 5.4 oz (81.8 kg) IBW/kg (Calculated) : 82.2  Vital Signs: Temp: 97.6 F (36.4 C) (06/05 1115) Temp Source: Oral (06/05 0626) BP: 114/79 (06/05 1115) Pulse Rate: 71 (06/05 1115)  Labs: Recent Labs    09/27/17 0045 09/27/17 0726 09/27/17 1233 09/27/17 1845 09/28/17 0559 09/28/17 0818 09/28/17 1413  HGB 14.3  --   --   --  12.2*  --   --   HCT 41.9  --   --   --  35.8*  --   --   PLT 225  --   --   --  181  --   --   LABPROT  --  13.8  --   --   --   --   --   INR  --  1.07  --   --   --   --   --   HEPARINUNFRC  --  0.55  --   --  0.19*  --  0.37  CREATININE 1.21  --   --   --   --  1.11  --   TROPONINI  --  2.49* 4.48* 6.25*  --   --   --    Estimated Creatinine Clearance: 72.7 mL/min (by C-G formula based on SCr of 1.11 mg/dL).  Assessment: 72yo male c/o intermitted CP associated w/ nausea and cold sweats x1 wk, troponin found to be elevated, to begin heparin.   Heparin now therapeutic, awaiting CABG Friday  Goal of Therapy:  Heparin level 0.3-0.7 units/ml Monitor platelets by anticoagulation protocol: Yes   Plan:  Continue heparin at 1100 units / hr Follow up AM labs  Thank you Anette Guarneri, PharmD [phone]

## 2017-09-28 NOTE — Progress Notes (Signed)
ANTICOAGULATION CONSULT NOTE   Pharmacy Consult for Heparin Indication: chest pain/ACS, multivessel CAD  No Known Allergies  Patient Measurements: Height: 6\' 2"  (188 cm) Weight: 180 lb 5.4 oz (81.8 kg) IBW/kg (Calculated) : 82.2  Vital Signs: Temp: 97.2 F (36.2 C) (06/05 0626) Temp Source: Oral (06/05 0626) BP: 120/60 (06/05 0626) Pulse Rate: 67 (06/05 0626)  Labs: Recent Labs    09/27/17 0045 09/27/17 0726 09/27/17 1233 09/27/17 1845 09/28/17 0559  HGB 14.3  --   --   --  12.2*  HCT 41.9  --   --   --  35.8*  PLT 225  --   --   --  181  LABPROT  --  13.8  --   --   --   INR  --  1.07  --   --   --   HEPARINUNFRC  --  0.55  --   --  0.19*  CREATININE 1.21  --   --   --   --   TROPONINI  --  2.49* 4.48* 6.25*  --    Estimated Creatinine Clearance: 66.7 mL/min (by C-G formula based on SCr of 1.21 mg/dL).  Medical History: Past Medical History:  Diagnosis Date  . Arthritis   . Asthma   . Cancer [facility])    prostate  . Depression   . Diabetes mellitus without complication (HCC)    , diet controlled  . GERD (gastroesophageal reflux disease)   . H/O colonoscopy 03/12/2009  . History of kidney stones   . Hyperlipidemia   . Hypothyroidism   . Inguinal hernia   . Neck pain   . Thyroid disease    hyperthyroidism; surgery changed him to hypo   Assessment: 72yo male c/o intermitted CP associated w/ nausea and cold sweats x1 wk, troponin found to be elevated, to begin heparin.   Pt now s/p cath lab, awaiting TCTS consult. Pharmacy asked to resume heparin 8 hrs after sheath out.  Sheath removed at 1550 PM.  6/5 AM update: heparin level low this AM after re-start s/p cath, no issues per RN.   Goal of Therapy:  Heparin level 0.3-0.7 units/ml Monitor platelets by anticoagulation protocol: Yes   Plan:  Inc heparin to 1100 units/hr 1400 HL  Narda Bonds, PharmD, BCPS Clinical Pharmacist Phone: [phone]

## 2017-09-29 ENCOUNTER — Encounter (HOSPITAL_COMMUNITY): Payer: 59

## 2017-09-29 ENCOUNTER — Inpatient Hospital Stay (HOSPITAL_COMMUNITY): Payer: 59

## 2017-09-29 DIAGNOSIS — Z01810 Encounter for preprocedural cardiovascular examination: Secondary | ICD-10-CM

## 2017-09-29 LAB — BLOOD GAS, ARTERIAL
Acid-base deficit: 1.2 mmol/L (ref 0.0–2.0)
Bicarbonate: 22.6 mmol/L (ref 20.0–28.0)
Drawn by: 398991
FIO2: 21
O2 Saturation: 96.3 %
Patient temperature: 98.6
pCO2 arterial: 35.1 mmHg (ref 32.0–48.0)
pH, Arterial: 7.424 (ref 7.350–7.450)
pO2, Arterial: 83.1 mmHg (ref 83.0–108.0)

## 2017-09-29 LAB — TYPE AND SCREEN
ABO/RH(D): B POS
Antibody Screen: NEGATIVE

## 2017-09-29 LAB — GLUCOSE, CAPILLARY
GLUCOSE-CAPILLARY: 121 mg/dL — AB (ref 65–99)
GLUCOSE-CAPILLARY: 166 mg/dL — AB (ref 65–99)
Glucose-Capillary: 129 mg/dL — ABNORMAL HIGH (ref 65–99)
Glucose-Capillary: 103 mg/dL — ABNORMAL HIGH (ref 65–99)

## 2017-09-29 LAB — CBC
HEMATOCRIT: 34.9 % — AB (ref 39.0–52.0)
HEMOGLOBIN: 11.8 g/dL — AB (ref 13.0–17.0)
MCH: 31.3 pg (ref 26.0–34.0)
MCHC: 33.8 g/dL (ref 30.0–36.0)
MCV: 92.6 fL (ref 78.0–100.0)
Platelets: 173 10*3/uL (ref 150–400)
RBC: 3.77 MIL/uL — AB (ref 4.22–5.81)
RDW: 12 % (ref 11.5–15.5)
WBC: 7.9 10*3/uL (ref 4.0–10.5)

## 2017-09-29 LAB — HEPARIN LEVEL (UNFRACTIONATED): Heparin Unfractionated: 0.42 IU/mL (ref 0.30–0.70)

## 2017-09-29 LAB — COMPREHENSIVE METABOLIC PANEL
ALT: 20 U/L (ref 17–63)
AST: 32 U/L (ref 15–41)
Albumin: 2.9 g/dL — ABNORMAL LOW (ref 3.5–5.0)
Alkaline Phosphatase: 69 U/L (ref 38–126)
Anion gap: 5 (ref 5–15)
BUN: 18 mg/dL (ref 6–20)
CO2: 25 mmol/L (ref 22–32)
Calcium: 8.5 mg/dL — ABNORMAL LOW (ref 8.9–10.3)
Chloride: 108 mmol/L (ref 101–111)
Creatinine, Ser: 1.03 mg/dL (ref 0.61–1.24)
GFR calc Af Amer: >60 mL/min (ref >60)
GFR calc non Af Amer: >60 mL/min (ref >60)
Glucose, Bld: 143 mg/dL — ABNORMAL HIGH (ref 65–99)
Potassium: 3.7 mmol/L (ref 3.5–5.1)
Sodium: 138 mmol/L (ref 135–145)
Total Bilirubin: 0.6 mg/dL (ref 0.3–1.2)
Total Protein: 5.4 g/dL — ABNORMAL LOW (ref 6.5–8.1)

## 2017-09-29 LAB — MAGNESIUM: MAGNESIUM: 1.9 mg/dL (ref 1.7–2.4)

## 2017-09-29 LAB — PROTIME-INR
INR: 1.11
Prothrombin Time: 14.2 seconds (ref 11.4–15.2)

## 2017-09-29 LAB — HEMOGLOBIN A1C
Hgb A1c MFr Bld: 6.7 % — ABNORMAL HIGH (ref 4.8–5.6)
Hgb A1c MFr Bld: 6.9 % — ABNORMAL HIGH (ref 4.8–5.6)
Mean Plasma Glucose: 145.59 mg/dL
Mean Plasma Glucose: 151.33 mg/dL

## 2017-09-29 MED ORDER — MAGNESIUM SULFATE 50 % IJ SOLN
40.0000 meq | INTRAMUSCULAR | Status: DC
  Filled 2017-09-29: qty 9.85

## 2017-09-29 MED ORDER — CEFUROXIME SODIUM 1.5 G IV SOLR
1.5000 g | INTRAVENOUS | Status: AC
  Administered 2017-09-30: 1.5 g via INTRAVENOUS
  Filled 2017-09-29: qty 1.5

## 2017-09-29 MED ORDER — TEMAZEPAM 15 MG PO CAPS
15.0000 mg | ORAL_CAPSULE | Freq: Once | ORAL | Status: AC | PRN
  Administered 2017-09-29: 15 mg via ORAL
  Filled 2017-09-29: qty 1

## 2017-09-29 MED ORDER — CHLORHEXIDINE GLUCONATE 0.12 % MT SOLN
15.0000 mL | Freq: Once | OROMUCOSAL | Status: AC
  Administered 2017-09-30: 05:00:00 15 mL via OROMUCOSAL
  Filled 2017-09-29: qty 15

## 2017-09-29 MED ORDER — EPINEPHRINE PF 1 MG/ML IJ SOLN
0.0000 ug/min | INTRAMUSCULAR | Status: DC
  Filled 2017-09-29: qty 4

## 2017-09-29 MED ORDER — NITROGLYCERIN IN D5W 200-5 MCG/ML-% IV SOLN
2.0000 ug/min | INTRAVENOUS | Status: DC
  Filled 2017-09-29: qty 250

## 2017-09-29 MED ORDER — MILRINONE LACTATE IN DEXTROSE 20-5 MG/100ML-% IV SOLN
0.1250 ug/kg/min | INTRAVENOUS | Status: DC
  Filled 2017-09-29: qty 100

## 2017-09-29 MED ORDER — TRANEXAMIC ACID (OHS) PUMP PRIME SOLUTION
2.0000 mg/kg | INTRAVENOUS | Status: DC
  Filled 2017-09-29: qty 1.62

## 2017-09-29 MED ORDER — TRANEXAMIC ACID 1000 MG/10ML IV SOLN
1.5000 mg/kg/h | INTRAVENOUS | Status: AC
  Administered 2017-09-30: 1.5 mg/kg/h via INTRAVENOUS
  Filled 2017-09-29: qty 25

## 2017-09-29 MED ORDER — TRANEXAMIC ACID (OHS) BOLUS VIA INFUSION
15.0000 mg/kg | INTRAVENOUS | Status: AC
Start: 2017-09-30 — End: 2017-09-30
  Administered 2017-09-30: 1215 mg via INTRAVENOUS
  Filled 2017-09-29: qty 1215

## 2017-09-29 MED ORDER — SODIUM CHLORIDE 0.9 % IV SOLN
750.0000 mg | INTRAVENOUS | Status: DC
  Filled 2017-09-29: qty 750

## 2017-09-29 MED ORDER — CHLORHEXIDINE GLUCONATE CLOTH 2 % EX PADS: 6.0000 | MEDICATED_PAD | Freq: Once | CUTANEOUS | Status: DC

## 2017-09-29 MED ORDER — DEXMEDETOMIDINE HCL IN NACL 400 MCG/100ML IV SOLN
0.1000 ug/kg/h | INTRAVENOUS | Status: AC
  Administered 2017-09-30: .5 ug/kg/h via INTRAVENOUS
  Filled 2017-09-29: qty 100

## 2017-09-29 MED ORDER — SODIUM CHLORIDE 0.9 % IV SOLN
INTRAVENOUS | Status: AC
  Administered 2017-09-30: 1.4 [IU]/h via INTRAVENOUS
  Filled 2017-09-29: qty 1

## 2017-09-29 MED ORDER — BISACODYL 5 MG PO TBEC
5.0000 mg | DELAYED_RELEASE_TABLET | Freq: Once | ORAL | Status: AC
  Administered 2017-09-29: 5 mg via ORAL
  Filled 2017-09-29: qty 1

## 2017-09-29 MED ORDER — METOPROLOL TARTRATE 12.5 MG HALF TABLET
12.5000 mg | ORAL_TABLET | Freq: Once | ORAL | Status: AC
  Administered 2017-09-30: 05:00:00 12.5 mg via ORAL
  Filled 2017-09-29: qty 1

## 2017-09-29 MED ORDER — SODIUM CHLORIDE 0.9 % IV SOLN
30.0000 ug/min | INTRAVENOUS | Status: AC
  Administered 2017-09-30: 20 ug/min via INTRAVENOUS
  Filled 2017-09-29: qty 20

## 2017-09-29 MED ORDER — PLASMA-LYTE 148 IV SOLN
INTRAVENOUS | Status: AC
  Administered 2017-09-30: 500 mL
  Filled 2017-09-29: qty 2.5

## 2017-09-29 MED ORDER — SODIUM CHLORIDE 0.9 % IV SOLN
INTRAVENOUS | Status: DC
  Filled 2017-09-29: qty 30

## 2017-09-29 MED ORDER — POTASSIUM CHLORIDE 2 MEQ/ML IV SOLN
80.0000 meq | INTRAVENOUS | Status: DC
  Filled 2017-09-29: qty 40

## 2017-09-29 MED ORDER — CHLORHEXIDINE GLUCONATE 4 % EX LIQD
CUTANEOUS | Status: AC
Start: 2017-09-29 — End: 2017-09-30
  Administered 2017-09-29 – 2017-09-30 (×2): 4
  Filled 2017-09-29: qty 120

## 2017-09-29 MED ORDER — VANCOMYCIN HCL 10 G IV SOLR
1250.0000 mg | INTRAVENOUS | Status: AC
  Administered 2017-09-30: 1250 mg via INTRAVENOUS
  Filled 2017-09-29: qty 1250

## 2017-09-29 MED ORDER — DOPAMINE-DEXTROSE 3.2-5 MG/ML-% IV SOLN
0.0000 ug/kg/min | INTRAVENOUS | Status: DC
  Filled 2017-09-29: qty 250

## 2017-09-29 MED ORDER — CHLORHEXIDINE GLUCONATE CLOTH 2 % EX PADS
6.0000 | MEDICATED_PAD | Freq: Once | CUTANEOUS | Status: AC
  Administered 2017-09-29: 22:00:00 6 via TOPICAL

## 2017-09-29 NOTE — Progress Notes (Signed)
[address]       [place] [address]             [phone]                 2 Days Post-Op Procedure(s) (LRB): LEFT HEART CATH AND CORONARY ANGIOGRAPHY (N/A)  LOS: 2 days   Subjective: No chest pian, dopplers done today with out significant carotid stenosis   Objective: Vital signs in last 24 hours: Patient Vitals for the past 24 hrs:  BP Temp Temp src Pulse Resp SpO2 Weight  09/29/17 1612 (!) 141/75 98.1 F (36.7 C) Oral 75 19 96 % -  09/29/17 0801 128/78 98.3 F (36.8 C) Oral 69 16 96 % -  09/29/17 0413 108/65 98.1 F (36.7 C) Oral 66 15 95 % 178 lb 9.2 oz (81 kg)  09/28/17 2100 116/69 98.7 F (37.1 C) Oral 71 17 - -  09/28/17 2000 - - - - (!) 22 - -    Filed Weights   09/27/17 0956 09/28/17 0626 09/29/17 0413  Weight: 179 lb 15.7 oz (81.6 kg) 180 lb 5.4 oz (81.8 kg) 178 lb 9.2 oz (81 kg)    Hemodynamic parameters for last 24 hours:    Intake/Output from previous day: 06/05 0701 - 06/06 0700 In: 1281 [P.O.:720; I.V.:561] Out: 600 [Urine:600] Intake/Output this shift: No intake/output data recorded.  Scheduled Meds: . aspirin EC  81 mg Oral Daily  . atorvastatin  80 mg Oral q1800  . buPROPion  150 mg Oral Daily  . chlorhexidine      . [START ON 09/30/2017] chlorhexidine  15 mL Mouth/Throat Once  . Chlorhexidine Gluconate Cloth  6 each Topical Once   And  . Chlorhexidine Gluconate Cloth  6 each Topical Once  . [START ON 09/30/2017] heparin-papaverine-plasmalyte irrigation   Irrigation To OR  . insulin aspart  0-5 Units Subcutaneous QHS  . insulin aspart  0-9 Units Subcutaneous TID WC  . insulin glargine  10 Units Subcutaneous q morning - 10a  . levothyroxine  200 mcg Oral QAC breakfast  . [START ON 09/30/2017] magnesium sulfate  40 mEq Other To OR  . metoprolol tartrate  12.5 mg Oral BID  . [START ON 09/30/2017] metoprolol tartrate  12.5 mg Oral Once  . mometasone-formoterol  2 puff Inhalation BID  . pantoprazole  40 mg Oral Daily  .  [START ON 09/30/2017] potassium chloride  80 mEq Other To OR  . sodium chloride flush  3 mL Intravenous Q12H  . [START ON 09/30/2017] tranexamic acid  15 mg/kg Intravenous To OR  . [START ON 09/30/2017] tranexamic acid  2 mg/kg Intracatheter To OR   Continuous Infusions: . sodium chloride    . [START ON 09/30/2017] cefUROXime (ZINACEF)  IV    . [START ON 09/30/2017] cefUROXime (ZINACEF)  IV    . [START ON 09/30/2017] dexmedetomidine    . [START ON 09/30/2017] DOPamine    . [START ON 09/30/2017] epinephrine    . [START ON 09/30/2017] heparin 30,000 units/NS 1000 mL solution for CELLSAVER    . heparin 1,100 Units/hr (09/29/17 1400)  . [START ON 09/30/2017] insulin (NOVOLIN-R) infusion    . [START ON 09/30/2017] milrinone    . nitroGLYCERIN 10 mcg/min (09/29/17 1400)  . [START ON 09/30/2017] nitroGLYCERIN    . [START ON 09/30/2017] phenylephrine 20mg /283mL NS (0.08mg /ml) infusion    . [START ON 09/30/2017] tranexamic acid (CYKLOKAPRON) infusion ([facility])    . [START ON 09/30/2017]  vancomycin     PRN Meds:.sodium chloride, acetaminophen, Melatonin, nitroGLYCERIN, ondansetron (ZOFRAN) IV, oxyCODONE, sodium chloride flush, temazepam, zolpidem  General appearance: alert, cooperative and no distress Neurologic: intact Heart: regular rate and rhythm, S1, S2 normal, no murmur, click, rub or gallop Lungs: clear to auscultation bilaterally Abdomen: soft, non-tender; bowel sounds normal; no masses,  no organomegaly Extremities: extremities normal, atraumatic, no cyanosis or edema and Homans sign is negative, no sign of DVT Wound: right radial cth site intact  Lab Results: CBC: Recent Labs    09/28/17 0559 09/29/17 0403  WBC 7.1 7.9  HGB 12.2* 11.8*  HCT 35.8* 34.9*  PLT 181 173   BMET:  Recent Labs    09/28/17 0818 09/29/17 0403  NA 136 138  K 4.1 3.7  CL 102 108  CO2 26 25  GLUCOSE 198* 143*  BUN 24* 18  CREATININE 1.11 1.03  CALCIUM 8.6* 8.5*    PT/INR:  Recent Labs    09/29/17 0403  LABPROT 14.2    INR 1.11     Radiology No results found.   Assessment/Plan: S/P Procedure(s) (LRB): LEFT HEART CATH AND CORONARY ANGIOGRAPHY (N/A)  The goals risks and alternatives of the planned surgical procedure coronary artery bypass grafting have been discussed with the patient in detail. The risks of the procedure including death, infection, stroke, myocardial infarction, bleeding, blood transfusion have all been discussed specifically.  I have quoted Dan Lester a 3 % of perioperative mortality and a complication rate as high as 40 %. The patient's questions have been answered.Dan Lester is willing  to proceed with the planned procedure.  preop orders signed    Dan Isaac MD 09/29/2017 7:31 PM     Patient ID: Dan Lester, male   DOB: 04-24-1946, 72 y.o.   MRN: [id number]

## 2017-09-29 NOTE — Progress Notes (Signed)
Pre-op Cardiac Surgery  Carotid Findings:  1-39% ICA stenosis. Vertebral artery flow is antegrade.   Upper Extremity Right Left  Brachial Pressures 120T 121T  Radial Waveforms T T  Ulnar Waveforms T T  Palmar Arch (Allen's Test) WNL WNL   Findings:      Lower  Extremity Right Left  Dorsalis Pedis 134T 133T  Anterior Tibial    Posterior Tibial 136T 135T  Ankle/Brachial Indices 1.12 1.12    Findings:  ABIs are within normal limits at rest.

## 2017-09-29 NOTE — Progress Notes (Addendum)
Progress Note  Patient Name: Dan Lester Date of Encounter: 09/29/2017  Primary Cardiologist: Dr. Grayland Jack   Subjective   Pt feeling well today. No chest pain overnight. Anticipating CABG tomorrow, 09/30/17  Inpatient Medications    Scheduled Meds: . aspirin EC  81 mg Oral Daily  . atorvastatin  80 mg Oral q1800  . buPROPion  150 mg Oral Daily  . [START ON 09/30/2017] heparin-papaverine-plasmalyte irrigation   Irrigation To OR  . insulin aspart  0-5 Units Subcutaneous QHS  . insulin aspart  0-9 Units Subcutaneous TID WC  . insulin glargine  10 Units Subcutaneous q morning - 10a  . levothyroxine  200 mcg Oral QAC breakfast  . [START ON 09/30/2017] magnesium sulfate  40 mEq Other To OR  . metoprolol tartrate  12.5 mg Oral BID  . mometasone-formoterol  2 puff Inhalation BID  . pantoprazole  40 mg Oral Daily  . [START ON 09/30/2017] potassium chloride  80 mEq Other To OR  . sodium chloride flush  3 mL Intravenous Q12H  . [START ON 09/30/2017] tranexamic acid  15 mg/kg Intravenous To OR  . [START ON 09/30/2017] tranexamic acid  2 mg/kg Intracatheter To OR   Continuous Infusions: . sodium chloride    . [START ON 09/30/2017] cefUROXime (ZINACEF)  IV    . [START ON 09/30/2017] cefUROXime (ZINACEF)  IV    . [START ON 09/30/2017] dexmedetomidine    . [START ON 09/30/2017] DOPamine    . [START ON 09/30/2017] epinephrine    . [START ON 09/30/2017] heparin 30,000 units/NS 1000 mL solution for CELLSAVER    . heparin 1,100 Units/hr (09/29/17 0600)  . [START ON 09/30/2017] insulin (NOVOLIN-R) infusion    . [START ON 09/30/2017] milrinone    . nitroGLYCERIN 15 mcg/min (09/29/17 0600)  . [START ON 09/30/2017] nitroGLYCERIN    . [START ON 09/30/2017] phenylephrine 20mg /262mL NS (0.08mg /ml) infusion    . [START ON 09/30/2017] tranexamic acid (CYKLOKAPRON) infusion ([facility])    . [START ON 09/30/2017] vancomycin     PRN Meds: sodium chloride, acetaminophen, Melatonin, nitroGLYCERIN, ondansetron (ZOFRAN) IV,  oxyCODONE, sodium chloride flush, zolpidem   Vital Signs    Vitals:   09/28/17 2000 09/28/17 2100 09/29/17 0413 09/29/17 0801  BP:  116/69 108/65 128/78  Pulse:  71 66 69  Resp: (!) 22 17 15 16   Temp:  98.7 F (37.1 C) 98.1 F (36.7 C) 98.3 F (36.8 C)  TempSrc:  Oral Oral Oral  SpO2:   95% 96%  Weight:   178 lb 9.2 oz (81 kg)   Height:        Intake/Output Summary (Last 24 hours) at 09/29/2017 0847 Last data filed at 09/29/2017 0823 Gross per 24 hour  Intake 1625.52 ml  Output 600 ml  Net 1025.52 ml   Filed Weights   09/27/17 0956 09/28/17 0626 09/29/17 0413  Weight: 179 lb 15.7 oz (81.6 kg) 180 lb 5.4 oz (81.8 kg) 178 lb 9.2 oz (81 kg)    Physical Exam   General: Well developed, well nourished, NAD Skin: Warm, dry, intact  Head: Normocephalic, atraumatic, clear, moist mucus membranes. Neck: Negative for carotid bruits. No JVD Lungs:Clear to ausculation bilaterally. No wheezes, rales, or rhonchi. Breathing is unlabored. Cardiovascular: RRR with S1 S2. No murmurs, rubs or gallops Abdomen: Soft, non-tender, non-distended with normoactive bowel sounds. No obvious abdominal masses. MSK: Strength and tone appear normal for age. 5/5 in all extremities Extremities: No edema. No clubbing or cyanosis. DP/PT  pulses 2+ bilaterally Neuro: Alert and oriented. No focal deficits. No facial asymmetry. MAE spontaneously. Psych: Responds to questions appropriately with normal affect.    Labs    Chemistry Recent Labs  Lab 09/27/17 0045 09/28/17 0818 09/29/17 0403  NA 140 136 138  K 3.8 4.1 3.7  CL 103 102 108  CO2 28 26 25   GLUCOSE 139* 198* 143*  BUN 26* 24* 18  CREATININE 1.21 1.11 1.03  CALCIUM 9.4 8.6* 8.5*  PROT  --   --  5.4*  ALBUMIN  --   --  2.9*  AST  --   --  32  ALT  --   --  20  ALKPHOS  --   --  69  BILITOT  --   --  0.6  GFRNONAA 59* >60 >60  GFRAA >60 >60 >60  ANIONGAP 9 8 5      Hematology Recent Labs  Lab 09/27/17 0045 09/28/17 0559  09/29/17 0403  WBC 7.7 7.1 7.9  RBC 4.58 3.84* 3.77*  HGB 14.3 12.2* 11.8*  HCT 41.9 35.8* 34.9*  MCV 91.5 93.2 92.6  MCH 31.2 31.8 31.3  MCHC 34.1 34.1 33.8  RDW 11.9 12.0 12.0  PLT 225 181 173    Cardiac Enzymes Recent Labs  Lab 09/27/17 0726 09/27/17 1233 09/27/17 1845  TROPONINI 2.49* 4.48* 6.25*    Recent Labs  Lab 09/27/17 0104  TROPIPOC 1.27*     BNPNo results for input(s): BNP, PROBNP in the last 168 hours.   DDimer No results for input(s): DDIMER in the last 168 hours.   Radiology    No results found.  Telemetry    09/29/17 NSR with frequent PVC's  - Personally Reviewed  ECG    No new tracing 7 09/29/2017- Personally Reviewed  Cardiac Studies   Echocardiogram 09/27/2016:  Study Conclusions  - Left ventricle: The cavity size was normal. There was mild focal basal hypertrophy of the septum. Systolic function was normal. The estimated ejection fraction was in the range of 55% to 60%. Possible mild hypokinesis of the inferior myocardium. There was an increased relative contribution of atrial contraction to ventricular filling. Doppler parameters are consistent with abnormal left ventricular relaxation (grade 1 diastolic dysfunction). - Mitral valve: Calcified annulus.  Cardiac catheterization 09/27/2017:   Severe multivessel coronary artery disease.  Normal left main.  Proximal LAD beyond the first diagonal 95%. Mid 50% stenosis. The first diagonal contains a subtotally occluded branch with TIMI grade II flow and 80% stenosis of the larger branch.  The circumflex coronary artery is diffusely diseased with greater than 90% obstruction in the first marginal and 95% stenosis in the continuation of the circumflex to the second obtuse marginal.  High-grade segmental 90% stenosis in the mid right coronary. PDA contains 70% obstruction.  Overall LV function is lower normal with an estimated EF of 50%. LVEDP is 21 mmHg. Findings  compatible with chronic diastolic heart failure.  RECOMMENDATIONS:   Given the diffuse nature of the disease in this diabetic, will recommend evaluation by cardiac surgery to determine if he is a candidate for surgical revascularization.  Patient Profile     72 y.o. male admitted with chest pain and found to have a non-ST segment elevation myocardial infarction.  Assessment & Plan    1. NSTEMI with severe three-vessel CAD: -Cardiac catheterization completed 09/28/2017 which revealed severe three-vessel disease with normal LM. TCTS surgical consultation with plan for CABG on 09/30/2017 per Dr. Servando Snare -Echocardiogram 09/27/2017 with LVEF 55 to  60% with mild hypokinesis of the inferior myocardium and G1DD -Continues to have mild chest discomfort>>>continue NTG gtt -Continue heparin gtt until surgical evaluation -Continue ASA, high intensity statin, metoprolol -Troponin, 2.49, 4.48, 6.25  2.  DM2: -Stable, SSI and Lantus for glucose control -Hemoglobin A1c 6.7  3.  Chronic diastolic dysfunction: -Per echocardiogram 09/27/2017 with normal LV function with G1DD -Patient asymptomatic  4.  Hyperthyroidism: -TSH, 0.080, with T4 1.88 -We will need follow-up with PCP post-hospitalization -Continue Synthroid 200 mcg  5. Frequent PVC's: -Asymptomatic, will continue to monitor -Will check Mg+ level this AM -K+, 3.7 today   Signed, Kathyrn Drown NP-C [facility] Pager: [phone] 09/29/2017, 8:47 AM     For questions or updates, please contact   Please consult [email or web address] for contact info under Cardiology/STEMI.  Attending Note:   The patient was seen and examined.  Agree with assessment and plan as noted above.  Changes made to the above note as needed.  Patient seen and independently examined with Kathyrn Drown. NP .   We discussed all aspects of the encounter. I agree with the assessment and plan as stated above.  1. CAD :  Doing well. For CABG tomorrow  Not having an y  cp at present    I have spent a total of 40 minutes with patient reviewing hospital  notes , telemetry, EKGs, labs and examining patient as well as establishing an assessment and plan that was discussed with the patient. > 50% of time was spent in direct patient care.    Thayer Headings, Brooke Bonito., MD, [facility] 09/29/2017, 1:56 PM [address],  [place] Pager [phone]

## 2017-09-29 NOTE — Progress Notes (Signed)
ANTICOAGULATION CONSULT NOTE   Pharmacy Consult for Heparin Indication: chest pain/ACS, multivessel CAD  No Known Allergies  Patient Measurements: Height: 6\' 2"  (188 cm) Weight: 178 lb 9.2 oz (81 kg) IBW/kg (Calculated) : 82.2  Vital Signs: Temp: 98.3 F (36.8 C) (06/06 0801) Temp Source: Oral (06/06 0801) BP: 128/78 (06/06 0801) Pulse Rate: 69 (06/06 0801)  Labs: Recent Labs    09/27/17 0045  09/27/17 0726 09/27/17 1233 09/27/17 1845 09/28/17 0559 09/28/17 0818 09/28/17 1413 09/29/17 0403  HGB 14.3  --   --   --   --  12.2*  --   --  11.8*  HCT 41.9  --   --   --   --  35.8*  --   --  34.9*  PLT 225  --   --   --   --  181  --   --  173  LABPROT  --   --  13.8  --   --   --   --   --  14.2  INR  --   --  1.07  --   --   --   --   --  1.11  HEPARINUNFRC  --    < > 0.55  --   --  0.19*  --  0.37 0.42  CREATININE 1.21  --   --   --   --   --  1.11  --  1.03  TROPONINI  --   --  2.49* 4.48* 6.25*  --   --   --   --    < > = values in this interval not displayed.   Estimated Creatinine Clearance: 77.5 mL/min (by C-G formula based on SCr of 1.03 mg/dL).  Assessment: 72yo male c/o intermitted CP associated w/ nausea and cold sweats x1 wk, troponin found to be elevated, to begin heparin.   Heparin remains therapeutic: 0.42, awaiting CABG 6/7, CBC stable, no overt bleeding noted  Goal of Therapy:  Heparin level 0.3-0.7 units/ml Monitor platelets by anticoagulation protocol: Yes   Plan:  Continue heparin at 1100 units / hr Daily heparin level/CBC Monitor for s/sx of bleeding  Georga Bora, PharmD Clinical Pharmacist 09/29/2017 9:51 AM

## 2017-09-30 ENCOUNTER — Inpatient Hospital Stay (HOSPITAL_COMMUNITY): Payer: 59

## 2017-09-30 ENCOUNTER — Inpatient Hospital Stay (HOSPITAL_COMMUNITY)
Admission: EM | Disposition: A | Discharge status: Home / Self Care | Payer: Self-pay | Source: Home / Self Care | Attending: Cardiothoracic Surgery

## 2017-09-30 ENCOUNTER — Inpatient Hospital Stay (HOSPITAL_COMMUNITY): Payer: 59 | Admitting: Anesthesiology

## 2017-09-30 DIAGNOSIS — I2510 Atherosclerotic heart disease of native coronary artery without angina pectoris: Secondary | ICD-10-CM

## 2017-09-30 HISTORY — PX: CORONARY ARTERY BYPASS GRAFT: SHX141

## 2017-09-30 HISTORY — PX: TEE WITHOUT CARDIOVERSION: SHX5443

## 2017-09-30 HISTORY — DX: Atherosclerotic heart disease of native coronary artery without angina pectoris: I25.10

## 2017-09-30 LAB — POCT I-STAT, CHEM 8
BUN: 16 mg/dL (ref 6–20)
BUN: 14 mg/dL (ref 6–20)
BUN: 15 mg/dL (ref 6–20)
BUN: 15 mg/dL (ref 6–20)
BUN: 15 mg/dL (ref 6–20)
BUN: 13 mg/dL (ref 6–20)
BUN: 14 mg/dL (ref 6–20)
BUN: 14 mg/dL (ref 6–20)
CALCIUM ION: 1.22 mmol/L (ref 1.15–1.40)
CALCIUM ION: 1.15 mmol/L (ref 1.15–1.40)
CALCIUM ION: 1.23 mmol/L (ref 1.15–1.40)
CHLORIDE: 103 mmol/L (ref 101–111)
CHLORIDE: 103 mmol/L (ref 101–111)
CREATININE: 0.7 mg/dL (ref 0.61–1.24)
CREATININE: 0.7 mg/dL (ref 0.61–1.24)
CREATININE: 0.7 mg/dL (ref 0.61–1.24)
Calcium, Ion: 1.26 mmol/L (ref 1.15–1.40)
Calcium, Ion: 1.06 mmol/L — ABNORMAL LOW (ref 1.15–1.40)
Calcium, Ion: 1.12 mmol/L — ABNORMAL LOW (ref 1.15–1.40)
Calcium, Ion: 1.22 mmol/L (ref 1.15–1.40)
Calcium, Ion: 1.28 mmol/L (ref 1.15–1.40)
Chloride: 100 mmol/L — ABNORMAL LOW (ref 101–111)
Chloride: 101 mmol/L (ref 101–111)
Chloride: 101 mmol/L (ref 101–111)
Chloride: 103 mmol/L (ref 101–111)
Chloride: 104 mmol/L (ref 101–111)
Chloride: 103 mmol/L (ref 101–111)
Creatinine, Ser: 0.8 mg/dL (ref 0.61–1.24)
Creatinine, Ser: 0.8 mg/dL (ref 0.61–1.24)
Creatinine, Ser: 0.6 mg/dL — ABNORMAL LOW (ref 0.61–1.24)
Creatinine, Ser: 0.7 mg/dL (ref 0.61–1.24)
Creatinine, Ser: 0.8 mg/dL (ref 0.61–1.24)
GLUCOSE: 132 mg/dL — AB (ref 65–99)
GLUCOSE: 94 mg/dL (ref 65–99)
GLUCOSE: 113 mg/dL — AB (ref 65–99)
GLUCOSE: 115 mg/dL — AB (ref 65–99)
Glucose, Bld: 105 mg/dL — ABNORMAL HIGH (ref 65–99)
Glucose, Bld: 115 mg/dL — ABNORMAL HIGH (ref 65–99)
Glucose, Bld: 123 mg/dL — ABNORMAL HIGH (ref 65–99)
Glucose, Bld: 175 mg/dL — ABNORMAL HIGH (ref 65–99)
HCT: 26 % — ABNORMAL LOW (ref 39.0–52.0)
HCT: 24 % — ABNORMAL LOW (ref 39.0–52.0)
HCT: 20 % — ABNORMAL LOW (ref 39.0–52.0)
HCT: 32 % — ABNORMAL LOW (ref 39.0–52.0)
HEMATOCRIT: 34 % — AB (ref 39.0–52.0)
HEMATOCRIT: 26 % — AB (ref 39.0–52.0)
HEMATOCRIT: 30 % — AB (ref 39.0–52.0)
HEMATOCRIT: 26 % — AB (ref 39.0–52.0)
HEMOGLOBIN: 8.2 g/dL — AB (ref 13.0–17.0)
HEMOGLOBIN: 6.8 g/dL — AB (ref 13.0–17.0)
Hemoglobin: 11.6 g/dL — ABNORMAL LOW (ref 13.0–17.0)
Hemoglobin: 10.2 g/dL — ABNORMAL LOW (ref 13.0–17.0)
Hemoglobin: 8.8 g/dL — ABNORMAL LOW (ref 13.0–17.0)
Hemoglobin: 8.8 g/dL — ABNORMAL LOW (ref 13.0–17.0)
Hemoglobin: 8.8 g/dL — ABNORMAL LOW (ref 13.0–17.0)
Hemoglobin: 10.9 g/dL — ABNORMAL LOW (ref 13.0–17.0)
POTASSIUM: 3.9 mmol/L (ref 3.5–5.1)
POTASSIUM: 3.7 mmol/L (ref 3.5–5.1)
Potassium: 3.8 mmol/L (ref 3.5–5.1)
Potassium: 3.8 mmol/L (ref 3.5–5.1)
Potassium: 4.1 mmol/L (ref 3.5–5.1)
Potassium: 4.2 mmol/L (ref 3.5–5.1)
Potassium: 3.6 mmol/L (ref 3.5–5.1)
Potassium: 4.4 mmol/L (ref 3.5–5.1)
SODIUM: 139 mmol/L (ref 135–145)
SODIUM: 140 mmol/L (ref 135–145)
SODIUM: 137 mmol/L (ref 135–145)
SODIUM: 138 mmol/L (ref 135–145)
Sodium: 140 mmol/L (ref 135–145)
Sodium: 139 mmol/L (ref 135–145)
Sodium: 140 mmol/L (ref 135–145)
Sodium: 140 mmol/L (ref 135–145)
TCO2: 28 mmol/L (ref 22–32)
TCO2: 25 mmol/L (ref 22–32)
TCO2: 27 mmol/L (ref 22–32)
TCO2: 29 mmol/L (ref 22–32)
TCO2: 25 mmol/L (ref 22–32)
TCO2: 23 mmol/L (ref 22–32)
TCO2: 26 mmol/L (ref 22–32)
TCO2: 22 mmol/L (ref 22–32)

## 2017-09-30 LAB — GLUCOSE, CAPILLARY
GLUCOSE-CAPILLARY: 149 mg/dL — AB (ref 65–99)
Glucose-Capillary: 125 mg/dL — ABNORMAL HIGH (ref 65–99)
Glucose-Capillary: 115 mg/dL — ABNORMAL HIGH (ref 65–99)
Glucose-Capillary: 111 mg/dL — ABNORMAL HIGH (ref 65–99)
Glucose-Capillary: 138 mg/dL — ABNORMAL HIGH (ref 65–99)
Glucose-Capillary: 139 mg/dL — ABNORMAL HIGH (ref 65–99)
Glucose-Capillary: 148 mg/dL — ABNORMAL HIGH (ref 65–99)
Glucose-Capillary: 184 mg/dL — ABNORMAL HIGH (ref 65–99)
Glucose-Capillary: 175 mg/dL — ABNORMAL HIGH (ref 65–99)
Glucose-Capillary: 162 mg/dL — ABNORMAL HIGH (ref 65–99)

## 2017-09-30 LAB — CBC
HCT: 39.6 % (ref 39.0–52.0)
HCT: 31.5 % — ABNORMAL LOW (ref 39.0–52.0)
HEMATOCRIT: 30.4 % — AB (ref 39.0–52.0)
HEMOGLOBIN: 10.5 g/dL — AB (ref 13.0–17.0)
Hemoglobin: 13.6 g/dL (ref 13.0–17.0)
Hemoglobin: 10.9 g/dL — ABNORMAL LOW (ref 13.0–17.0)
MCH: 31.9 pg (ref 26.0–34.0)
MCH: 31.5 pg (ref 26.0–34.0)
MCH: 31.7 pg (ref 26.0–34.0)
MCHC: 34.3 g/dL (ref 30.0–36.0)
MCHC: 34.5 g/dL (ref 30.0–36.0)
MCHC: 34.6 g/dL (ref 30.0–36.0)
MCV: 92.7 fL (ref 78.0–100.0)
MCV: 91.3 fL (ref 78.0–100.0)
MCV: 91.6 fL (ref 78.0–100.0)
Platelets: 201 10*3/uL (ref 150–400)
Platelets: 115 10*3/uL — ABNORMAL LOW (ref 150–400)
Platelets: 125 10*3/uL — ABNORMAL LOW (ref 150–400)
RBC: 4.27 MIL/uL (ref 4.22–5.81)
RBC: 3.33 MIL/uL — ABNORMAL LOW (ref 4.22–5.81)
RBC: 3.44 MIL/uL — ABNORMAL LOW (ref 4.22–5.81)
RDW: 12 % (ref 11.5–15.5)
RDW: 11.8 % (ref 11.5–15.5)
RDW: 12 % (ref 11.5–15.5)
WBC: 9.5 10*3/uL (ref 4.0–10.5)
WBC: 10.2 10*3/uL (ref 4.0–10.5)
WBC: 10.7 10*3/uL — ABNORMAL HIGH (ref 4.0–10.5)

## 2017-09-30 LAB — POCT I-STAT 3, ART BLOOD GAS (G3+)
ACID-BASE DEFICIT: 3 mmol/L — AB (ref 0.0–2.0)
ACID-BASE DEFICIT: 4 mmol/L — AB (ref 0.0–2.0)
ACID-BASE DEFICIT: 4 mmol/L — AB (ref 0.0–2.0)
ACID-BASE EXCESS: 2 mmol/L (ref 0.0–2.0)
Acid-base deficit: 2 mmol/L (ref 0.0–2.0)
BICARBONATE: 27 mmol/L (ref 20.0–28.0)
BICARBONATE: 23 mmol/L (ref 20.0–28.0)
Bicarbonate: 22.7 mmol/L (ref 20.0–28.0)
Bicarbonate: 22 mmol/L (ref 20.0–28.0)
Bicarbonate: 21.4 mmol/L (ref 20.0–28.0)
O2 SAT: 100 %
O2 SAT: 95 %
O2 SAT: 94 %
O2 SAT: 94 %
O2 Saturation: 100 %
PH ART: 7.387 (ref 7.350–7.450)
PH ART: 7.325 — AB (ref 7.350–7.450)
PO2 ART: 396 mmHg — AB (ref 83.0–108.0)
PO2 ART: 74 mmHg — AB (ref 83.0–108.0)
PO2 ART: 74 mmHg — AB (ref 83.0–108.0)
Patient temperature: 36.1
Patient temperature: 36.7
TCO2: 28 mmol/L (ref 22–32)
TCO2: 24 mmol/L (ref 22–32)
TCO2: 24 mmol/L (ref 22–32)
TCO2: 23 mmol/L (ref 22–32)
TCO2: 23 mmol/L (ref 22–32)
pCO2 arterial: 40.5 mmHg (ref 32.0–48.0)
pCO2 arterial: 37.8 mmHg (ref 32.0–48.0)
pCO2 arterial: 41.1 mmHg (ref 32.0–48.0)
pCO2 arterial: 42.3 mmHg (ref 32.0–48.0)
pCO2 arterial: 41 mmHg (ref 32.0–48.0)
pH, Arterial: 7.432 (ref 7.350–7.450)
pH, Arterial: 7.352 (ref 7.350–7.450)
pH, Arterial: 7.32 — ABNORMAL LOW (ref 7.350–7.450)
pO2, Arterial: 232 mmHg — ABNORMAL HIGH (ref 83.0–108.0)
pO2, Arterial: 76 mmHg — ABNORMAL LOW (ref 83.0–108.0)

## 2017-09-30 LAB — PLATELET COUNT: Platelets: 118 10*3/uL — ABNORMAL LOW (ref 150–400)

## 2017-09-30 LAB — CREATININE, SERUM
Creatinine, Ser: 0.89 mg/dL (ref 0.61–1.24)
GFR calc Af Amer: >60 mL/min (ref >60)
GFR calc non Af Amer: >60 mL/min (ref >60)

## 2017-09-30 LAB — BASIC METABOLIC PANEL
Anion gap: 6 (ref 5–15)
BUN: 17 mg/dL (ref 6–20)
CO2: 26 mmol/L (ref 22–32)
Calcium: 8.9 mg/dL (ref 8.9–10.3)
Chloride: 106 mmol/L (ref 101–111)
Creatinine, Ser: 1.18 mg/dL (ref 0.61–1.24)
GFR calc Af Amer: >60 mL/min (ref >60)
GFR calc non Af Amer: >60 mL/min (ref >60)
Glucose, Bld: 127 mg/dL — ABNORMAL HIGH (ref 65–99)
Potassium: 3.8 mmol/L (ref 3.5–5.1)
Sodium: 138 mmol/L (ref 135–145)

## 2017-09-30 LAB — PROTIME-INR
INR: 1.42
Prothrombin Time: 17.2 seconds — ABNORMAL HIGH (ref 11.4–15.2)

## 2017-09-30 LAB — POCT I-STAT 4, (NA,K, GLUC, HGB,HCT)
Glucose, Bld: 128 mg/dL — ABNORMAL HIGH (ref 65–99)
HCT: 29 % — ABNORMAL LOW (ref 39.0–52.0)
Hemoglobin: 9.9 g/dL — ABNORMAL LOW (ref 13.0–17.0)
Potassium: 3.9 mmol/L (ref 3.5–5.1)
SODIUM: 140 mmol/L (ref 135–145)

## 2017-09-30 LAB — MAGNESIUM: Magnesium: 2.5 mg/dL — ABNORMAL HIGH (ref 1.7–2.4)

## 2017-09-30 LAB — HEPARIN LEVEL (UNFRACTIONATED): Heparin Unfractionated: 0.39 IU/mL (ref 0.30–0.70)

## 2017-09-30 LAB — APTT: APTT: 35 s (ref 24–36)

## 2017-09-30 LAB — HEMOGLOBIN AND HEMATOCRIT, BLOOD
HCT: 27 % — ABNORMAL LOW (ref 39.0–52.0)
Hemoglobin: 9.4 g/dL — ABNORMAL LOW (ref 13.0–17.0)

## 2017-09-30 SURGERY — CORONARY ARTERY BYPASS GRAFTING (CABG)
Anesthesia: General | Site: Chest

## 2017-09-30 MED ORDER — SODIUM CHLORIDE 0.9 % IV SOLN
1.5000 g | Freq: Two times a day (BID) | INTRAVENOUS | Status: AC
  Administered 2017-09-30 – 2017-10-02 (×4): 1.5 g via INTRAVENOUS
  Filled 2017-09-30 (×4): qty 1.5

## 2017-09-30 MED ORDER — SODIUM CHLORIDE 0.9 % IV SOLN
0.0000 ug/min | INTRAVENOUS | Status: DC
  Administered 2017-10-01: 25 ug/min via INTRAVENOUS
  Filled 2017-09-30: qty 20

## 2017-09-30 MED ORDER — PROTAMINE SULFATE 10 MG/ML IV SOLN
INTRAVENOUS | Status: AC
  Filled 2017-09-30: qty 5

## 2017-09-30 MED ORDER — PHENYLEPHRINE 40 MCG/ML (10ML) SYRINGE FOR IV PUSH (FOR BLOOD PRESSURE SUPPORT)
PREFILLED_SYRINGE | INTRAVENOUS | Status: AC
  Filled 2017-09-30: qty 10

## 2017-09-30 MED ORDER — POTASSIUM CHLORIDE 10 MEQ/50ML IV SOLN
10.0000 meq | INTRAVENOUS | Status: AC
  Administered 2017-09-30 (×3): 10 meq via INTRAVENOUS

## 2017-09-30 MED ORDER — HEMOSTATIC AGENTS (NO CHARGE) OPTIME
TOPICAL | Status: DC | PRN
  Administered 2017-09-30 (×4): 1 via TOPICAL

## 2017-09-30 MED ORDER — ORAL CARE MOUTH RINSE
15.0000 mL | Freq: Two times a day (BID) | OROMUCOSAL | Status: DC
  Administered 2017-10-01 – 2017-10-04 (×3): 15 mL via OROMUCOSAL

## 2017-09-30 MED ORDER — FENTANYL CITRATE (PF) 250 MCG/5ML IJ SOLN
INTRAMUSCULAR | Status: AC
  Filled 2017-09-30: qty 25

## 2017-09-30 MED ORDER — DOPAMINE-DEXTROSE 3.2-5 MG/ML-% IV SOLN
3.0000 ug/kg/min | INTRAVENOUS | Status: DC
  Administered 2017-09-30: 3 ug/kg/min via INTRAVENOUS

## 2017-09-30 MED ORDER — ACETAMINOPHEN 160 MG/5ML PO SOLN: 650.0000 mg | Freq: Once | ORAL | Status: AC

## 2017-09-30 MED ORDER — DEXAMETHASONE SODIUM PHOSPHATE 10 MG/ML IJ SOLN: INTRAMUSCULAR | Status: DC | PRN

## 2017-09-30 MED ORDER — SODIUM CHLORIDE 0.9 % IV SOLN: 250.0000 mL | INTRAVENOUS | Status: DC

## 2017-09-30 MED ORDER — SODIUM CHLORIDE 0.9 % IV SOLN: INTRAVENOUS | Status: DC

## 2017-09-30 MED ORDER — CHLORHEXIDINE GLUCONATE CLOTH 2 % EX PADS
6.0000 | MEDICATED_PAD | Freq: Every day | CUTANEOUS | Status: DC
  Administered 2017-09-30 – 2017-10-04 (×3): 6 via TOPICAL

## 2017-09-30 MED ORDER — LIDOCAINE 2% (20 MG/ML) 5 ML SYRINGE
INTRAMUSCULAR | Status: AC
  Filled 2017-09-30: qty 5

## 2017-09-30 MED ORDER — LACTATED RINGERS IV SOLN: INTRAVENOUS | Status: DC

## 2017-09-30 MED ORDER — PHENYLEPHRINE HCL 10 MG/ML IJ SOLN
INTRAMUSCULAR | Status: DC | PRN
  Administered 2017-09-30: 100 ug/min via INTRAVENOUS

## 2017-09-30 MED ORDER — BISACODYL 5 MG PO TBEC
10.0000 mg | DELAYED_RELEASE_TABLET | Freq: Every day | ORAL | Status: DC
  Administered 2017-10-01 – 2017-10-03 (×3): 10 mg via ORAL
  Filled 2017-09-30 (×4): qty 2

## 2017-09-30 MED ORDER — ROCURONIUM BROMIDE 10 MG/ML (PF) SYRINGE
PREFILLED_SYRINGE | INTRAVENOUS | Status: AC
  Filled 2017-09-30: qty 5

## 2017-09-30 MED ORDER — SODIUM CHLORIDE 0.9 % IV SOLN
INTRAVENOUS | Status: DC
  Filled 2017-09-30: qty 1

## 2017-09-30 MED ORDER — PROTAMINE SULFATE 10 MG/ML IV SOLN
INTRAVENOUS | Status: DC | PRN
  Administered 2017-09-30: 50 mg via INTRAVENOUS
  Administered 2017-09-30: 30 mg via INTRAVENOUS
  Administered 2017-09-30: 50 mg via INTRAVENOUS
  Administered 2017-09-30: 30 mg via INTRAVENOUS
  Administered 2017-09-30: 50 mg via INTRAVENOUS
  Administered 2017-09-30: 40 mg via INTRAVENOUS
  Administered 2017-09-30: 50 mg via INTRAVENOUS

## 2017-09-30 MED ORDER — OXYCODONE HCL 5 MG PO TABS
5.0000 mg | ORAL_TABLET | ORAL | Status: DC | PRN
  Administered 2017-09-30 – 2017-10-01 (×7): 10 mg via ORAL
  Administered 2017-10-02: 5 mg via ORAL
  Administered 2017-10-02: 10 mg via ORAL
  Administered 2017-10-02: 5 mg via ORAL
  Administered 2017-10-03 (×2): 10 mg via ORAL
  Filled 2017-09-30 (×4): qty 2
  Filled 2017-09-30: qty 1
  Filled 2017-09-30: qty 2
  Filled 2017-09-30: qty 1
  Filled 2017-09-30 (×5): qty 2

## 2017-09-30 MED ORDER — MORPHINE SULFATE (PF) 2 MG/ML IV SOLN
2.0000 mg | INTRAVENOUS | Status: DC | PRN
  Administered 2017-09-30 (×2): 2 mg via INTRAVENOUS
  Administered 2017-10-01: 4 mg via INTRAVENOUS
  Filled 2017-09-30 (×2): qty 1
  Filled 2017-09-30: qty 2
  Filled 2017-09-30: qty 1

## 2017-09-30 MED ORDER — ACETAMINOPHEN 500 MG PO TABS
1000.0000 mg | ORAL_TABLET | Freq: Four times a day (QID) | ORAL | Status: DC
  Administered 2017-10-01 – 2017-10-04 (×13): 1000 mg via ORAL
  Filled 2017-09-30 (×14): qty 2

## 2017-09-30 MED ORDER — ACETAMINOPHEN 160 MG/5ML PO SOLN: 1000.0000 mg | Freq: Four times a day (QID) | ORAL | Status: DC

## 2017-09-30 MED ORDER — SODIUM CHLORIDE 0.45 % IV SOLN: INTRAVENOUS | Status: DC | PRN

## 2017-09-30 MED ORDER — PROPOFOL 10 MG/ML IV BOLUS
INTRAVENOUS | Status: DC | PRN
  Administered 2017-09-30: 60 mg via INTRAVENOUS

## 2017-09-30 MED ORDER — BISACODYL 10 MG RE SUPP: 10.0000 mg | Freq: Every day | RECTAL | Status: DC

## 2017-09-30 MED ORDER — SODIUM CHLORIDE 0.9% FLUSH
3.0000 mL | Freq: Two times a day (BID) | INTRAVENOUS | Status: DC
  Administered 2017-10-01 – 2017-10-03 (×2): 3 mL via INTRAVENOUS

## 2017-09-30 MED ORDER — ARTIFICIAL TEARS OPHTHALMIC OINT
TOPICAL_OINTMENT | OPHTHALMIC | Status: DC | PRN
  Administered 2017-09-30: 1 via OPHTHALMIC

## 2017-09-30 MED ORDER — HEPARIN SODIUM (PORCINE) 1000 UNIT/ML IJ SOLN
INTRAMUSCULAR | Status: AC
  Filled 2017-09-30: qty 1

## 2017-09-30 MED ORDER — LACTATED RINGERS IV SOLN
INTRAVENOUS | Status: DC | PRN
  Administered 2017-09-30 (×2): via INTRAVENOUS

## 2017-09-30 MED ORDER — DEXMEDETOMIDINE HCL IN NACL 200 MCG/50ML IV SOLN
0.0000 ug/kg/h | INTRAVENOUS | Status: DC
  Administered 2017-09-30: 0.2 ug/kg/h via INTRAVENOUS
  Filled 2017-09-30: qty 50

## 2017-09-30 MED ORDER — ALBUMIN HUMAN 5 % IV SOLN
250.0000 mL | INTRAVENOUS | Status: AC | PRN
  Administered 2017-09-30 – 2017-10-01 (×4): 250 mL via INTRAVENOUS
  Filled 2017-09-30 (×2): qty 250

## 2017-09-30 MED ORDER — LACTATED RINGERS IV SOLN
INTRAVENOUS | Status: DC | PRN
  Administered 2017-09-30 (×2): via INTRAVENOUS

## 2017-09-30 MED ORDER — NITROGLYCERIN IN D5W 200-5 MCG/ML-% IV SOLN: 0.0000 ug/min | INTRAVENOUS | Status: DC

## 2017-09-30 MED ORDER — ARTIFICIAL TEARS OPHTHALMIC OINT
TOPICAL_OINTMENT | OPHTHALMIC | Status: AC
  Filled 2017-09-30: qty 3.5

## 2017-09-30 MED ORDER — SODIUM CHLORIDE 0.9% FLUSH: 10.0000 mL | INTRAVENOUS | Status: DC | PRN

## 2017-09-30 MED ORDER — PROPOFOL 10 MG/ML IV BOLUS
INTRAVENOUS | Status: AC
Start: 2017-09-30 — End: ?
  Filled 2017-09-30: qty 20

## 2017-09-30 MED ORDER — FAMOTIDINE IN NACL 20-0.9 MG/50ML-% IV SOLN
20.0000 mg | Freq: Two times a day (BID) | INTRAVENOUS | Status: DC
  Administered 2017-09-30: 20 mg via INTRAVENOUS

## 2017-09-30 MED ORDER — CALCIUM CHLORIDE 10 % IV SOLN
INTRAVENOUS | Status: DC | PRN
  Administered 2017-09-30: 200 mg via INTRAVENOUS
  Administered 2017-09-30 (×3): 100 mg via INTRAVENOUS
  Administered 2017-09-30: 200 mg via INTRAVENOUS

## 2017-09-30 MED ORDER — MIDAZOLAM HCL 2 MG/2ML IJ SOLN
INTRAMUSCULAR | Status: AC
  Filled 2017-09-30: qty 4

## 2017-09-30 MED ORDER — SODIUM CHLORIDE 0.9% FLUSH
10.0000 mL | Freq: Two times a day (BID) | INTRAVENOUS | Status: DC
  Administered 2017-10-01 – 2017-10-03 (×2): 10 mL

## 2017-09-30 MED ORDER — ASPIRIN EC 325 MG PO TBEC
325.0000 mg | DELAYED_RELEASE_TABLET | Freq: Every day | ORAL | Status: DC
  Administered 2017-10-01 – 2017-10-04 (×4): 325 mg via ORAL
  Filled 2017-09-30 (×4): qty 1

## 2017-09-30 MED ORDER — MORPHINE SULFATE (PF) 2 MG/ML IV SOLN: 1.0000 mg | INTRAVENOUS | Status: AC | PRN

## 2017-09-30 MED ORDER — SODIUM CHLORIDE 0.9 % IV SOLN
INTRAVENOUS | Status: DC | PRN
  Administered 2017-09-30: 750 mg via INTRAVENOUS

## 2017-09-30 MED ORDER — PANTOPRAZOLE SODIUM 40 MG PO TBEC
40.0000 mg | DELAYED_RELEASE_TABLET | Freq: Every day | ORAL | Status: DC
  Administered 2017-10-02 – 2017-10-04 (×3): 40 mg via ORAL
  Filled 2017-09-30 (×3): qty 1

## 2017-09-30 MED ORDER — PROTAMINE SULFATE 10 MG/ML IV SOLN
INTRAVENOUS | Status: AC
  Filled 2017-09-30: qty 25

## 2017-09-30 MED ORDER — LACTATED RINGERS IV SOLN: 500.0000 mL | Freq: Once | INTRAVENOUS | Status: DC | PRN

## 2017-09-30 MED ORDER — HEPARIN SODIUM (PORCINE) 1000 UNIT/ML IJ SOLN
INTRAMUSCULAR | Status: DC | PRN
  Administered 2017-09-30: 30000 [IU] via INTRAVENOUS

## 2017-09-30 MED ORDER — MIDAZOLAM HCL 5 MG/5ML IJ SOLN
INTRAMUSCULAR | Status: DC | PRN
  Administered 2017-09-30: 1 mg via INTRAVENOUS
  Administered 2017-09-30: 2 mg via INTRAVENOUS
  Administered 2017-09-30 (×2): 4 mg via INTRAVENOUS
  Administered 2017-09-30: 1 mg via INTRAVENOUS
  Administered 2017-09-30: 2 mg via INTRAVENOUS

## 2017-09-30 MED ORDER — VANCOMYCIN HCL IN DEXTROSE 1-5 GM/200ML-% IV SOLN
1000.0000 mg | Freq: Once | INTRAVENOUS | Status: AC
  Administered 2017-09-30: 1000 mg via INTRAVENOUS
  Filled 2017-09-30: qty 200

## 2017-09-30 MED ORDER — METOPROLOL TARTRATE 12.5 MG HALF TABLET
12.5000 mg | ORAL_TABLET | Freq: Two times a day (BID) | ORAL | Status: DC
  Administered 2017-09-30 – 2017-10-02 (×4): 12.5 mg via ORAL
  Filled 2017-09-30 (×4): qty 1

## 2017-09-30 MED ORDER — PHENYLEPHRINE 40 MCG/ML (10ML) SYRINGE FOR IV PUSH (FOR BLOOD PRESSURE SUPPORT)
PREFILLED_SYRINGE | INTRAVENOUS | Status: DC | PRN
  Administered 2017-09-30: 80 ug via INTRAVENOUS
  Administered 2017-09-30: 40 ug via INTRAVENOUS
  Administered 2017-09-30: 120 ug via INTRAVENOUS
  Administered 2017-09-30: 40 ug via INTRAVENOUS
  Administered 2017-09-30 (×4): 80 ug via INTRAVENOUS

## 2017-09-30 MED ORDER — ORAL CARE MOUTH RINSE
15.0000 mL | OROMUCOSAL | Status: DC
  Administered 2017-09-30: 15 mL via OROMUCOSAL

## 2017-09-30 MED ORDER — LACTATED RINGERS IV SOLN
INTRAVENOUS | Status: DC | PRN
  Administered 2017-09-30: 07:00:00 via INTRAVENOUS

## 2017-09-30 MED ORDER — FENTANYL CITRATE (PF) 250 MCG/5ML IJ SOLN
INTRAMUSCULAR | Status: AC
  Filled 2017-09-30: qty 5

## 2017-09-30 MED ORDER — SODIUM CHLORIDE 0.9% FLUSH: 3.0000 mL | INTRAVENOUS | Status: DC | PRN

## 2017-09-30 MED ORDER — CHLORHEXIDINE GLUCONATE 0.12% ORAL RINSE (MEDLINE KIT)
15.0000 mL | Freq: Two times a day (BID) | OROMUCOSAL | Status: DC
  Administered 2017-09-30 – 2017-10-04 (×4): 15 mL via OROMUCOSAL

## 2017-09-30 MED ORDER — CHLORHEXIDINE GLUCONATE 0.12 % MT SOLN
15.0000 mL | OROMUCOSAL | Status: AC
  Administered 2017-09-30: 15 mL via OROMUCOSAL
  Filled 2017-09-30: qty 15

## 2017-09-30 MED ORDER — CALCIUM CHLORIDE 10 % IV SOLN
INTRAVENOUS | Status: AC
  Filled 2017-09-30: qty 10

## 2017-09-30 MED ORDER — METOPROLOL TARTRATE 5 MG/5ML IV SOLN: 2.5000 mg | INTRAVENOUS | Status: DC | PRN

## 2017-09-30 MED ORDER — DOCUSATE SODIUM 100 MG PO CAPS
200.0000 mg | ORAL_CAPSULE | Freq: Every day | ORAL | Status: DC
  Administered 2017-10-01 – 2017-10-03 (×3): 200 mg via ORAL
  Filled 2017-09-30 (×4): qty 2

## 2017-09-30 MED ORDER — ACETAMINOPHEN 650 MG RE SUPP
650.0000 mg | Freq: Once | RECTAL | Status: AC
  Administered 2017-09-30: 650 mg via RECTAL

## 2017-09-30 MED ORDER — MIDAZOLAM HCL 10 MG/2ML IJ SOLN
INTRAMUSCULAR | Status: AC
  Filled 2017-09-30: qty 2

## 2017-09-30 MED ORDER — MAGNESIUM SULFATE 4 GM/100ML IV SOLN
4.0000 g | Freq: Once | INTRAVENOUS | Status: AC
  Administered 2017-09-30: 4 g via INTRAVENOUS
  Filled 2017-09-30: qty 100

## 2017-09-30 MED ORDER — ROCURONIUM BROMIDE 10 MG/ML (PF) SYRINGE
PREFILLED_SYRINGE | INTRAVENOUS | Status: DC | PRN
  Administered 2017-09-30 (×3): 50 mg via INTRAVENOUS
  Administered 2017-09-30: 20 mg via INTRAVENOUS
  Administered 2017-09-30: 50 mg via INTRAVENOUS
  Administered 2017-09-30: 20 mg via INTRAVENOUS

## 2017-09-30 MED ORDER — ONDANSETRON HCL 4 MG/2ML IJ SOLN
4.0000 mg | Freq: Four times a day (QID) | INTRAMUSCULAR | Status: DC | PRN
  Administered 2017-10-01: 4 mg via INTRAVENOUS
  Filled 2017-09-30: qty 2

## 2017-09-30 MED ORDER — TRAMADOL HCL 50 MG PO TABS
50.0000 mg | ORAL_TABLET | ORAL | Status: DC | PRN
  Administered 2017-10-02: 100 mg via ORAL
  Filled 2017-09-30: qty 2

## 2017-09-30 MED ORDER — FENTANYL CITRATE (PF) 250 MCG/5ML IJ SOLN
INTRAMUSCULAR | Status: DC | PRN
  Administered 2017-09-30: 100 ug via INTRAVENOUS
  Administered 2017-09-30: 200 ug via INTRAVENOUS
  Administered 2017-09-30 (×2): 100 ug via INTRAVENOUS
  Administered 2017-09-30 (×2): 50 ug via INTRAVENOUS
  Administered 2017-09-30: 250 ug via INTRAVENOUS
  Administered 2017-09-30 (×3): 50 ug via INTRAVENOUS
  Administered 2017-09-30 (×5): 100 ug via INTRAVENOUS

## 2017-09-30 MED ORDER — ASPIRIN 81 MG PO CHEW: 324.0000 mg | CHEWABLE_TABLET | Freq: Every day | ORAL | Status: DC

## 2017-09-30 MED ORDER — METOPROLOL TARTRATE 25 MG/10 ML ORAL SUSPENSION: 12.5000 mg | Freq: Two times a day (BID) | ORAL | Status: DC

## 2017-09-30 MED ORDER — INSULIN REGULAR BOLUS VIA INFUSION
0.0000 [IU] | Freq: Three times a day (TID) | INTRAVENOUS | Status: DC
  Administered 2017-10-01: 5 [IU] via INTRAVENOUS
  Filled 2017-09-30: qty 10

## 2017-09-30 MED ORDER — MIDAZOLAM HCL 2 MG/2ML IJ SOLN: 2.0000 mg | INTRAMUSCULAR | Status: DC | PRN

## 2017-09-30 MED ORDER — ALBUMIN HUMAN 5 % IV SOLN
INTRAVENOUS | Status: DC | PRN
  Administered 2017-09-30 (×2): via INTRAVENOUS

## 2017-09-30 SURGICAL SUPPLY — 71 items
ATTRACTOMAT 16X20 MAGNETIC DRP (DRAPES) ×3 IMPLANT
BAG DECANTER FOR FLEXI CONT (MISCELLANEOUS) ×3 IMPLANT
BANDAGE ACE 4X5 VEL STRL LF (GAUZE/BANDAGES/DRESSINGS) ×3 IMPLANT
BANDAGE ACE 6X5 VEL STRL LF (GAUZE/BANDAGES/DRESSINGS) ×3 IMPLANT
BLADE STERNUM SYSTEM 6 (BLADE) ×3 IMPLANT
BLADE SURG 11 STRL SS (BLADE) ×3 IMPLANT
BNDG GAUZE ELAST 4 BULKY (GAUZE/BANDAGES/DRESSINGS) ×3 IMPLANT
CANISTER SUCT 3000ML PPV (MISCELLANEOUS) ×3 IMPLANT
CATH CPB KIT GERHARDT (MISCELLANEOUS) ×3 IMPLANT
CATH THORACIC 28FR (CATHETERS) ×3 IMPLANT
CLIP FOGARTY SPRING 6M (CLIP) ×3 IMPLANT
CRADLE DONUT ADULT HEAD (MISCELLANEOUS) ×3 IMPLANT
DRAIN CHANNEL 28F RND 3/8 FF (WOUND CARE) ×3 IMPLANT
DRAPE CARDIOVASCULAR INCISE (DRAPES) ×1
DRAPE SLUSH/WARMER DISC (DRAPES) ×3 IMPLANT
DRAPE SRG 135X102X78XABS (DRAPES) ×2 IMPLANT
DRESSING AQUACEL AG SP 3.5X10 (GAUZE/BANDAGES/DRESSINGS) ×2 IMPLANT
DRSG AQUACEL AG ADV 3.5X14 (GAUZE/BANDAGES/DRESSINGS) ×3 IMPLANT
DRSG AQUACEL AG SP 3.5X10 (GAUZE/BANDAGES/DRESSINGS) ×3
ELECT BLADE 4.0 EZ CLEAN MEGAD (MISCELLANEOUS) ×3
ELECT REM PT RETURN 9FT ADLT (ELECTROSURGICAL) ×6
ELECTRODE BLDE 4.0 EZ CLN MEGD (MISCELLANEOUS) ×2 IMPLANT
ELECTRODE REM PT RTRN 9FT ADLT (ELECTROSURGICAL) ×4 IMPLANT
FELT TEFLON 1X6 (MISCELLANEOUS) ×3 IMPLANT
FLOSEAL 5ML (HEMOSTASIS) ×6 IMPLANT
GAUZE SPONGE 4X4 12PLY STRL (GAUZE/BANDAGES/DRESSINGS) ×6 IMPLANT
GLOVE BIO SURGEON STRL SZ 6.5 (GLOVE) ×18 IMPLANT
GOWN STRL REUS W/ TWL LRG LVL3 (GOWN DISPOSABLE) ×16 IMPLANT
GOWN STRL REUS W/TWL LRG LVL3 (GOWN DISPOSABLE) ×8
HEMOSTAT SURGICEL 2X14 (HEMOSTASIS) ×3 IMPLANT
KIT BASIN OR (CUSTOM PROCEDURE TRAY) ×3 IMPLANT
KIT CATH SUCT 8FR (CATHETERS) ×3 IMPLANT
KIT SUCTION CATH 14FR (SUCTIONS) ×6 IMPLANT
KIT TURNOVER KIT B (KITS) ×3 IMPLANT
KIT VASOVIEW HEMOPRO VH 3000 (KITS) ×3 IMPLANT
LEAD PACING MYOCARDI (MISCELLANEOUS) ×3 IMPLANT
MARKER GRAFT CORONARY BYPASS (MISCELLANEOUS) ×9 IMPLANT
NS IRRIG 1000ML POUR BTL (IV SOLUTION) ×18 IMPLANT
PACK E OPEN HEART (SUTURE) ×3 IMPLANT
PACK OPEN HEART (CUSTOM PROCEDURE TRAY) ×3 IMPLANT
PAD ARMBOARD 7.5X6 YLW CONV (MISCELLANEOUS) ×6 IMPLANT
PAD ELECT DEFIB RADIOL ZOLL (MISCELLANEOUS) ×3 IMPLANT
PENCIL BUTTON HOLSTER BLD 10FT (ELECTRODE) ×3 IMPLANT
POWDER SURGICEL 3.0 GRAM (HEMOSTASIS) ×6 IMPLANT
PUNCH AORTIC ROT 4.0MM RCL 40 (MISCELLANEOUS) ×3 IMPLANT
SET CARDIOPLEGIA MPS 5001102 (MISCELLANEOUS) ×3 IMPLANT
SPONGE LAP 18X18 X RAY DECT (DISPOSABLE) ×12 IMPLANT
SUT BONE WAX W31G (SUTURE) ×3 IMPLANT
SUT ETHIBOND 2 0 SH (SUTURE) ×1
SUT ETHIBOND 2 0 SH 36X2 (SUTURE) ×2 IMPLANT
SUT MNCRL AB 4-0 PS2 18 (SUTURE) ×3 IMPLANT
SUT PROLENE 3 0 SH1 36 (SUTURE) ×6 IMPLANT
SUT PROLENE 4 0 TF (SUTURE) ×6 IMPLANT
SUT PROLENE 6 0 C 1 30 (SUTURE) ×9 IMPLANT
SUT PROLENE 6 0 CC (SUTURE) ×12 IMPLANT
SUT PROLENE 7 0 BV1 MDA (SUTURE) ×6 IMPLANT
SUT PROLENE 8 0 BV175 6 (SUTURE) ×18 IMPLANT
SUT SILK 1 TIES 10X30 (SUTURE) ×3 IMPLANT
SUT STEEL 6MS V (SUTURE) ×3 IMPLANT
SUT STEEL SZ 6 DBL 3X14 BALL (SUTURE) ×3 IMPLANT
SUT VIC AB 1 CTX 18 (SUTURE) ×6 IMPLANT
SUT VIC AB 2-0 CT1 27 (SUTURE) ×1
SUT VIC AB 2-0 CT1 TAPERPNT 27 (SUTURE) ×2 IMPLANT
SYSTEM SAHARA CHEST DRAIN ATS (WOUND CARE) ×3 IMPLANT
TOWEL GREEN STERILE (TOWEL DISPOSABLE) ×3 IMPLANT
TOWEL GREEN STERILE FF (TOWEL DISPOSABLE) IMPLANT
TRAY FOLEY SLVR 16FR TEMP STAT (SET/KITS/TRAYS/PACK) ×3 IMPLANT
TUBE SUCTION CARDIAC 10FR (CANNULA) ×3 IMPLANT
TUBING INSUFFLATION (TUBING) ×3 IMPLANT
UNDERPAD 30X30 (UNDERPADS AND DIAPERS) ×3 IMPLANT
WATER STERILE IRR 1000ML POUR (IV SOLUTION) ×6 IMPLANT

## 2017-09-30 NOTE — Anesthesia Procedure Notes (Signed)
Procedure Name: Intubation Date/Time: 09/30/2017 7:42 AM Performed by: Renato Shin, CRNA Pre-anesthesia Checklist: Patient identified, Emergency Drugs available, Suction available and Patient being monitored Patient Re-evaluated:Patient Re-evaluated prior to induction Oxygen Delivery Method: Circle system utilized Preoxygenation: Pre-oxygenation with 100% oxygen Induction Type: IV induction Ventilation: Mask ventilation without difficulty and Oral airway inserted - appropriate to patient size Laryngoscope Size: Miller and 3 Grade View: Grade II Tube type: Oral Tube size: 8.0 mm Number of attempts: 1 Airway Equipment and Method: Stylet Placement Confirmation: ETT inserted through vocal cords under direct vision,  positive ETCO2,  CO2 detector and breath sounds checked- equal and bilateral Secured at: 23 cm Tube secured with: Tape Dental Injury: Teeth and Oropharynx as per pre-operative assessment

## 2017-09-30 NOTE — Anesthesia Procedure Notes (Signed)
Arterial Line Insertion Start/End6/10/2017 6:50 AM, 09/30/2017 6:55 AM Performed by: CRNA  Patient location: Pre-op. Preanesthetic checklist: patient identified, IV checked, site marked, risks and benefits discussed, surgical consent, monitors and equipment checked, pre-op evaluation, timeout performed and anesthesia consent Lidocaine 1% used for infiltration and patient sedated Left, radial was placed Catheter size: 20 G Hand hygiene performed , maximum sterile barriers used  and Seldinger technique used Allen's test indicative of satisfactory collateral circulation Attempts: 1 Procedure performed without using ultrasound guided technique. Following insertion, dressing applied and Biopatch. Post procedure assessment: normal  Patient tolerated the procedure well with no immediate complications.

## 2017-09-30 NOTE — Transfer of Care (Signed)
Immediate Anesthesia Transfer of Care Note  Patient: Dan Lester  Procedure(s) Performed: CORONARY ARTERY BYPASS GRAFTING (CABG) times five using left internal mammary artery to left anterior descending and endoscopically harvested right saphenous vein graft (N/A Chest) TRANSESOPHAGEAL ECHOCARDIOGRAM (TEE) (N/A )  Patient Location: SICU  Anesthesia Type:General  Level of Consciousness: sedated, unresponsive and Patient remains intubated per anesthesia plan  Airway & Oxygen Therapy: Patient remains intubated per anesthesia plan and Patient placed on Ventilator (see vital sign flow sheet for setting)  Post-op Assessment: Report given to RN and Post -op Vital signs reviewed and stable  Post vital signs: Reviewed and stable  Last Vitals:  Vitals Value Taken Time  BP    Temp 36.2 C 09/30/2017  2:09 PM  Pulse 90 09/30/2017  2:09 PM  Resp 12 09/30/2017  2:09 PM  SpO2 94 % 09/30/2017  2:09 PM  Vitals shown include unvalidated device data.  Last Pain:  Vitals:   09/30/17 0257  TempSrc: Oral  PainSc:       Patients Stated Pain Goal: 0 (99/24/26 8341)  Complications: No apparent anesthesia complications

## 2017-09-30 NOTE — Anesthesia Procedure Notes (Signed)
Central Venous Catheter Insertion Performed by: Roberts Gaudy, MD, anesthesiologist Start/End6/10/2017 6:35 AM, 09/30/2017 6:45 AM Patient location: Pre-op. Preanesthetic checklist: patient identified, IV checked, site marked, risks and benefits discussed, surgical consent, monitors and equipment checked, pre-op evaluation, timeout performed and anesthesia consent Lidocaine 1% used for infiltration and patient sedated Hand hygiene performed  and maximum sterile barriers used  Catheter size: 9 Fr Sheath introducer Procedure performed using ultrasound guided technique. Ultrasound Notes:anatomy identified, needle tip was noted to be adjacent to the nerve/plexus identified, no ultrasound evidence of intravascular and/or intraneural injection and image(s) printed for medical record Attempts: 1 Following insertion, line sutured and dressing applied. Post procedure assessment: blood return through all ports, free fluid flow and no air  Patient tolerated the procedure well with no immediate complications.

## 2017-09-30 NOTE — Anesthesia Preprocedure Evaluation (Signed)
Anesthesia Evaluation  Patient identified by MRN, date of birth, ID band Patient awake    Reviewed: Allergy & Precautions, NPO status , Patient's Chart, lab work & pertinent test results  Airway Mallampati: III  TM Distance: >3 FB Neck ROM: Full    Dental  (+) Teeth Intact   Pulmonary former smoker,    breath sounds clear to auscultation       Cardiovascular  Rhythm:Regular Rate:Normal     Neuro/Psych    GI/Hepatic   Endo/Other  diabetes  Renal/GU      Musculoskeletal   Abdominal   Peds  Hematology   Anesthesia Other Findings   Reproductive/Obstetrics                             Anesthesia Physical Anesthesia Plan  ASA: IV  Anesthesia Plan: General   Post-op Pain Management:    Induction: Intravenous  PONV Risk Score and Plan: 1 and 0 and Ondansetron and Dexamethasone  Airway Management Planned: Oral ETT  Additional Equipment: Arterial line, CVP, PA Cath, 3D TEE and Ultrasound Guidance Line Placement  Intra-op Plan:   Post-operative Plan: Post-operative intubation/ventilation  Informed Consent: I have reviewed the patients History and Physical, chart, labs and discussed the procedure including the risks, benefits and alternatives for the proposed anesthesia with the patient or authorized representative who has indicated his/her understanding and acceptance.     Plan Discussed with: CRNA and Anesthesiologist  Anesthesia Plan Comments:         Anesthesia Quick Evaluation

## 2017-09-30 NOTE — Anesthesia Postprocedure Evaluation (Signed)
Anesthesia Post Note  Patient: Dan Lester  Procedure(s) Performed: CORONARY ARTERY BYPASS GRAFTING (CABG) times five using left internal mammary artery to left anterior descending and endoscopically harvested right saphenous vein graft (N/A Chest) TRANSESOPHAGEAL ECHOCARDIOGRAM (TEE) (N/A )     Patient location during evaluation: SICU Anesthesia Type: General Level of consciousness: sedated and patient remains intubated per anesthesia plan Pain management: pain level controlled Vital Signs Assessment: post-procedure vital signs reviewed and stable Respiratory status: patient remains intubated per anesthesia plan and patient on ventilator - see flowsheet for VS Cardiovascular status: stable and blood pressure returned to baseline Postop Assessment: no apparent nausea or vomiting Anesthetic complications: no    Last Vitals:  Vitals:   09/30/17 1709 09/30/17 1730  BP:    Pulse: 89   Resp: 12   Temp: (!) 36.3 C   SpO2: 98% 97%    Last Pain:  Vitals:   09/30/17 1600  TempSrc: Core  PainSc:                  Mykira Hofmeister COKER

## 2017-09-30 NOTE — Anesthesia Procedure Notes (Signed)
Central Venous Catheter Insertion Performed by: Roberts Gaudy, MD, anesthesiologist Start/End6/10/2017 6:35 AM, 09/30/2017 6:45 AM Patient location: Pre-op. Preanesthetic checklist: patient identified, IV checked, site marked, risks and benefits discussed, surgical consent, monitors and equipment checked, pre-op evaluation, timeout performed and anesthesia consent Position: supine Hand hygiene performed  and maximum sterile barriers used  PA cath was placed.Swan type:thermodilution Procedure performed without using ultrasound guided technique. Attempts: 1 Following insertion, line sutured, dressing applied and Biopatch. Patient tolerated the procedure well with no immediate complications.

## 2017-09-30 NOTE — Procedures (Signed)
Extubation Procedure Note  Patient Details:   Name: Dan Lester DOB: 06/01/1945 MRN: [id number]   Airway Documentation:    Vent end date: (not recorded) Vent end time: (not recorded)   Evaluation  O2 sats: stable throughout Complications: No apparent complications Patient did tolerate procedure well. Bilateral Breath Sounds: Clear   Yes   Patient placed on 4lpm [place] with spO2 92 after extubation. No noted respirotry distress. Rudene Re 09/30/2017, 6:09 PM

## 2017-09-30 NOTE — Progress Notes (Addendum)
CT surgery p.m. Rounds  Hemodynamically stable.  Sinus rhythm. Extubated.  Remains drowsy, low respiratory rate-use BiPAP until anesthesia wears off Dangled on side of bed

## 2017-09-30 NOTE — Brief Op Note (Addendum)
      [address]       [place] [address]             [phone]     09/30/2017  4:14 PM  PATIENT:  [name]  72 y.o. male  PRE-OPERATIVE DIAGNOSIS:  CAD  POST-OPERATIVE DIAGNOSIS:  CAD  PROCEDURE:  TRANSESOPHAGEAL ECHOCARDIOGRAM (TEE), MEDIAN STERNOTOMY for CORONARY ARTERY BYPASS GRAFTING (CABG) times 5 (LIMA to LAD, SVG to DIAGONAL, SVG SEQUENTIALLY to DISTAL CIRCUMFLEX and OM, SVG to PDA) using left internal mammary artery and endoscopically harvested right greater saphenous vein graft   SURGEON:  Surgeon(s) and Role:    [name], MD - Primary  PHYSICIAN ASSISTANT: Lars Pinks PA-C  ASSISTANTS:  Alyssa Hagerty RNFA  ANESTHESIA:   general  DRAINS: Chest tubes placed in the mediastinal and pleural spaces   COUNTS:  YES  DICTATION: .Dragon Dictation  PLAN OF CARE: Admit to inpatient   PATIENT DISPOSITION:  ICU - intubated and hemodynamically stable.   Delay start of Pharmacological VTE agent (>24hrs) due to surgical blood loss or risk of bleeding: yes  BASELINE WEIGHT: 80 kg

## 2017-09-30 NOTE — Progress Notes (Signed)
  Echocardiogram Echocardiogram Transesophageal has been performed.  Dan Lester 09/30/2017, 8:03 AM

## 2017-09-30 NOTE — Progress Notes (Signed)
      [address]       [place] [address]             [phone]      Pre Procedure note for inpatients:   Zaryan Yakubov has been scheduled for Procedure(s): CORONARY ARTERY BYPASS GRAFTING (CABG) (N/A) TRANSESOPHAGEAL ECHOCARDIOGRAM (TEE) (N/A) today. The various methods of treatment have been discussed with the patient. After consideration of the risks, benefits and treatment options the patient has consented to the planned procedure.   The patient has been seen and labs reviewed. There are no changes in the patient's condition to prevent proceeding with the planned procedure today.  Recent labs:  Lab Results  Component Value Date   WBC 9.5 09/30/2017   HGB 13.6 09/30/2017   HCT 39.6 09/30/2017   PLT 201 09/30/2017   GLUCOSE 127 (H) 09/30/2017   ALT 20 09/29/2017   AST 32 09/29/2017   NA 138 09/30/2017   K 3.8 09/30/2017   CL 106 09/30/2017   CREATININE 1.18 09/30/2017   BUN 17 09/30/2017   CO2 26 09/30/2017   TSH 0.080 (L) 09/27/2017   INR 1.11 09/29/2017   HGBA1C 6.9 (H) 09/29/2017    [name], MD 09/30/2017 7:06 AM

## 2017-10-01 ENCOUNTER — Inpatient Hospital Stay (HOSPITAL_COMMUNITY): Payer: 59

## 2017-10-01 LAB — POCT I-STAT, CHEM 8
BUN: 20 mg/dL (ref 6–20)
Calcium, Ion: 1.24 mmol/L (ref 1.15–1.40)
Chloride: 98 mmol/L — ABNORMAL LOW (ref 101–111)
Creatinine, Ser: 0.9 mg/dL (ref 0.61–1.24)
Glucose, Bld: 186 mg/dL — ABNORMAL HIGH (ref 65–99)
HCT: 28 % — ABNORMAL LOW (ref 39.0–52.0)
HEMOGLOBIN: 9.5 g/dL — AB (ref 13.0–17.0)
Potassium: 4.3 mmol/L (ref 3.5–5.1)
Sodium: 137 mmol/L (ref 135–145)
TCO2: 21 mmol/L — AB (ref 22–32)

## 2017-10-01 LAB — BASIC METABOLIC PANEL
ANION GAP: 4 — AB (ref 5–15)
BUN: 15 mg/dL (ref 6–20)
CALCIUM: 8.2 mg/dL — AB (ref 8.9–10.3)
CO2: 24 mmol/L (ref 22–32)
Chloride: 107 mmol/L (ref 101–111)
Creatinine, Ser: 0.85 mg/dL (ref 0.61–1.24)
GFR calc Af Amer: >60 mL/min (ref >60)
GFR calc non Af Amer: >60 mL/min (ref >60)
GLUCOSE: 87 mg/dL (ref 65–99)
POTASSIUM: 4.1 mmol/L (ref 3.5–5.1)
Sodium: 135 mmol/L (ref 135–145)

## 2017-10-01 LAB — CBC
HEMATOCRIT: 31.3 % — AB (ref 39.0–52.0)
HEMATOCRIT: 29.8 % — AB (ref 39.0–52.0)
HEMOGLOBIN: 10.1 g/dL — AB (ref 13.0–17.0)
Hemoglobin: 10.8 g/dL — ABNORMAL LOW (ref 13.0–17.0)
MCH: 32 pg (ref 26.0–34.0)
MCH: 31.7 pg (ref 26.0–34.0)
MCHC: 34.5 g/dL (ref 30.0–36.0)
MCHC: 33.9 g/dL (ref 30.0–36.0)
MCV: 92.6 fL (ref 78.0–100.0)
MCV: 93.4 fL (ref 78.0–100.0)
PLATELETS: 142 10*3/uL — AB (ref 150–400)
Platelets: 116 10*3/uL — ABNORMAL LOW (ref 150–400)
RBC: 3.38 MIL/uL — ABNORMAL LOW (ref 4.22–5.81)
RBC: 3.19 MIL/uL — ABNORMAL LOW (ref 4.22–5.81)
RDW: 12.1 % (ref 11.5–15.5)
RDW: 12.3 % (ref 11.5–15.5)
WBC: 12.6 10*3/uL — AB (ref 4.0–10.5)
WBC: 10.8 10*3/uL — ABNORMAL HIGH (ref 4.0–10.5)

## 2017-10-01 LAB — POCT I-STAT 3, ART BLOOD GAS (G3+)
Acid-base deficit: 2 mmol/L (ref 0.0–2.0)
Acid-base deficit: 3 mmol/L — ABNORMAL HIGH (ref 0.0–2.0)
Bicarbonate: 21.7 mmol/L (ref 20.0–28.0)
Bicarbonate: 23.8 mmol/L (ref 20.0–28.0)
O2 SAT: 96 %
O2 Saturation: 97 %
PCO2 ART: 42.4 mmHg (ref 32.0–48.0)
PH ART: 7.358 (ref 7.350–7.450)
PO2 ART: 87 mmHg (ref 83.0–108.0)
Patient temperature: 36.9
Patient temperature: 36.9
TCO2: 23 mmol/L (ref 22–32)
TCO2: 25 mmol/L (ref 22–32)
pCO2 arterial: 38.3 mmHg (ref 32.0–48.0)
pH, Arterial: 7.36 (ref 7.350–7.450)
pO2, Arterial: 92 mmHg (ref 83.0–108.0)

## 2017-10-01 LAB — GLUCOSE, CAPILLARY
GLUCOSE-CAPILLARY: 101 mg/dL — AB (ref 65–99)
GLUCOSE-CAPILLARY: 82 mg/dL (ref 65–99)
GLUCOSE-CAPILLARY: 182 mg/dL — AB (ref 65–99)
GLUCOSE-CAPILLARY: 185 mg/dL — AB (ref 65–99)
Glucose-Capillary: 100 mg/dL — ABNORMAL HIGH (ref 65–99)
Glucose-Capillary: 115 mg/dL — ABNORMAL HIGH (ref 65–99)
Glucose-Capillary: 137 mg/dL — ABNORMAL HIGH (ref 65–99)
Glucose-Capillary: 161 mg/dL — ABNORMAL HIGH (ref 65–99)
Glucose-Capillary: 179 mg/dL — ABNORMAL HIGH (ref 65–99)
Glucose-Capillary: 211 mg/dL — ABNORMAL HIGH (ref 65–99)
Glucose-Capillary: 92 mg/dL (ref 65–99)
Glucose-Capillary: 94 mg/dL (ref 65–99)
Glucose-Capillary: 94 mg/dL (ref 65–99)

## 2017-10-01 LAB — MAGNESIUM
Magnesium: 2.4 mg/dL (ref 1.7–2.4)
Magnesium: 2.4 mg/dL (ref 1.7–2.4)

## 2017-10-01 LAB — CREATININE, SERUM
Creatinine, Ser: 1.06 mg/dL (ref 0.61–1.24)
GFR calc Af Amer: >60 mL/min (ref >60)

## 2017-10-01 MED ORDER — MORPHINE SULFATE (PF) 2 MG/ML IV SOLN
1.0000 mg | INTRAVENOUS | Status: AC | PRN
  Administered 2017-10-01: 2 mg via INTRAVENOUS
  Filled 2017-10-01: qty 1

## 2017-10-01 MED ORDER — INSULIN DETEMIR 100 UNIT/ML ~~LOC~~ SOLN
10.0000 [IU] | Freq: Once | SUBCUTANEOUS | Status: AC
  Administered 2017-10-01: 10 [IU] via SUBCUTANEOUS
  Filled 2017-10-01: qty 0.1

## 2017-10-01 MED ORDER — FUROSEMIDE 40 MG PO TABS
40.0000 mg | ORAL_TABLET | Freq: Every day | ORAL | Status: DC
  Administered 2017-10-01 – 2017-10-04 (×4): 40 mg via ORAL
  Filled 2017-10-01 (×3): qty 1

## 2017-10-01 MED ORDER — INSULIN ASPART 100 UNIT/ML ~~LOC~~ SOLN
0.0000 [IU] | SUBCUTANEOUS | Status: DC
  Administered 2017-10-01 (×4): 4 [IU] via SUBCUTANEOUS
  Administered 2017-10-02: 2 [IU] via SUBCUTANEOUS

## 2017-10-01 NOTE — Progress Notes (Signed)
1 Day Post-Op Procedure(s) (LRB): CORONARY ARTERY BYPASS GRAFTING (CABG) times five using left internal mammary artery to left anterior descending and endoscopically harvested right saphenous vein graft (N/A) TRANSESOPHAGEAL ECHOCARDIOGRAM (TEE) (N/A) Subjective: More alert- off BIPap since midnight ABG normal  Objective: Vital signs in last 24 hours: Temp:  [96.6 F (35.9 C)-98.6 F (37 C)] 98.1 F (36.7 C) (06/08 0800) Pulse Rate:  [70-91] 79 (06/08 0800) Cardiac Rhythm: Atrial paced (06/08 0800) Resp:  [11-29] 21 (06/08 0800) BP: (106-132)/(65-83) 117/74 (06/08 0400) SpO2:  [91 %-100 %] 92 % (06/08 0800) Arterial Line BP: (87-137)/(26-76) 117/56 (06/08 0800) FiO2 (%):  [40 %-80 %] 40 % (06/08 0036) Weight:  [186 lb 11.2 oz (84.7 kg)] 186 lb 11.2 oz (84.7 kg) (06/08 0500)  Hemodynamic parameters for last 24 hours: PAP: (16-33)/(6-19) 28/16 CO:  [3.5 L/min-5.5 L/min] 4.4 L/min CI:  [1.7 L/min/m2-2.7 L/min/m2] 2.2 L/min/m2  Intake/Output from previous day: 06/07 0701 - 06/08 0700 In: 7038.1 [P.O.:160; I.V.:4636.1; Blood:497; IV Piggyback:1700] Out: 2841 [LKGMW:1027; Blood:1200; Chest Tube:490] Intake/Output this shift: Total I/O In: 73.4 [I.V.:73.4] Out: 55 [Urine:45; Chest Tube:10]       Exam    General- alert and comfortable    Neck- no JVD, no cervical adenopathy palpable, no carotid bruit   Lungs- clear without rales, wheezes   Cor- regular rate and rhythm, no murmur , gallop   Abdomen- soft, non-tender   Extremities - warm, non-tender, minimal edema   Neuro- oriented, appropriate, no focal weakness   Lab Results: Recent Labs    09/30/17 2045 10/01/17 0313  WBC 10.7* 12.6*  HGB 10.9*  10.9* 10.8*  HCT 31.5*  32.0* 31.3*  PLT 125* 142*   BMET:  Recent Labs    09/30/17 0557  09/30/17 2045 10/01/17 0313  NA 138   < > 138 135  K 3.8   < > 4.4 4.1  CL 106   < > 103 107  CO2 26  --   --  24  GLUCOSE 127*   < > 175* 87  BUN 17   < > 14 15   CREATININE 1.18   < > 0.89  0.80 0.85  CALCIUM 8.9  --   --  8.2*   < > = values in this interval not displayed.    PT/INR:  Recent Labs    09/30/17 1414  LABPROT 17.2*  INR 1.42   ABG    Component Value Date/Time   PHART 7.360 10/01/2017 0555   HCO3 21.7 10/01/2017 0555   TCO2 23 10/01/2017 0555   ACIDBASEDEF 3.0 (H) 10/01/2017 0555   O2SAT 97.0 10/01/2017 0555   CBG (last 3)  Recent Labs    10/01/17 0551 10/01/17 0652 10/01/17 0757  GLUCAP 92 94 100*    Assessment/Plan: S/P Procedure(s) (LRB): CORONARY ARTERY BYPASS GRAFTING (CABG) times five using left internal mammary artery to left anterior descending and endoscopically harvested right saphenous vein graft (N/A) TRANSESOPHAGEAL ECHOCARDIOGRAM (TEE) (N/A) Mobilize Diuresis Diabetes control d/c tubes/lines See progression orders   LOS: 4 days    Dan Lester 10/01/2017

## 2017-10-01 NOTE — Progress Notes (Signed)
CT surgery p.m. Rounds    Patient is stable day Lines and chest tubes out Maintaining sinus rhythm Walking in hallway P.m. labs satisfactory

## 2017-10-01 NOTE — Plan of Care (Signed)
  Problem: Education: Goal: Knowledge of General Education information will improve Outcome: Progressing   Problem: Education: Goal: Ability to demonstrate proper wound care will improve Outcome: Progressing Goal: Knowledge of disease or condition will improve Outcome: Progressing Goal: Knowledge of the prescribed therapeutic regimen will improve Outcome: Progressing   Problem: Activity: Goal: Risk for activity intolerance will decrease Outcome: Progressing   Problem: Cardiac: Goal: Hemodynamic stability will improve Outcome: Progressing   Problem: Clinical Measurements: Goal: Postoperative complications will be avoided or minimized Outcome: Progressing   Problem: Respiratory: Goal: Respiratory status will improve Outcome: Progressing   Problem: Skin Integrity: Goal: Wound healing without signs and symptoms of infection Outcome: Progressing Goal: Risk for impaired skin integrity will decrease Outcome: Progressing   Problem: Urinary Elimination: Goal: Ability to achieve and maintain adequate renal perfusion and functioning will improve Outcome: Progressing

## 2017-10-02 ENCOUNTER — Inpatient Hospital Stay (HOSPITAL_COMMUNITY): Payer: 59

## 2017-10-02 LAB — CBC
HCT: 28.6 % — ABNORMAL LOW (ref 39.0–52.0)
Hemoglobin: 9.7 g/dL — ABNORMAL LOW (ref 13.0–17.0)
MCH: 32.2 pg (ref 26.0–34.0)
MCHC: 33.9 g/dL (ref 30.0–36.0)
MCV: 95 fL (ref 78.0–100.0)
Platelets: 108 10*3/uL — ABNORMAL LOW (ref 150–400)
RBC: 3.01 MIL/uL — ABNORMAL LOW (ref 4.22–5.81)
RDW: 12.6 % (ref 11.5–15.5)
WBC: 9.8 10*3/uL (ref 4.0–10.5)

## 2017-10-02 LAB — GLUCOSE, CAPILLARY
GLUCOSE-CAPILLARY: 114 mg/dL — AB (ref 65–99)
Glucose-Capillary: 124 mg/dL — ABNORMAL HIGH (ref 65–99)
Glucose-Capillary: 216 mg/dL — ABNORMAL HIGH (ref 65–99)
Glucose-Capillary: 197 mg/dL — ABNORMAL HIGH (ref 65–99)
Glucose-Capillary: 172 mg/dL — ABNORMAL HIGH (ref 65–99)

## 2017-10-02 LAB — BASIC METABOLIC PANEL
Anion gap: 5 (ref 5–15)
BUN: 23 mg/dL — ABNORMAL HIGH (ref 6–20)
CO2: 27 mmol/L (ref 22–32)
Calcium: 8.2 mg/dL — ABNORMAL LOW (ref 8.9–10.3)
Chloride: 103 mmol/L (ref 101–111)
Creatinine, Ser: 1 mg/dL (ref 0.61–1.24)
GFR calc Af Amer: >60 mL/min (ref >60)
GFR calc non Af Amer: >60 mL/min (ref >60)
Glucose, Bld: 123 mg/dL — ABNORMAL HIGH (ref 65–99)
Potassium: 4.1 mmol/L (ref 3.5–5.1)
Sodium: 135 mmol/L (ref 135–145)

## 2017-10-02 MED ORDER — INSULIN DETEMIR 100 UNIT/ML ~~LOC~~ SOLN
10.0000 [IU] | Freq: Every day | SUBCUTANEOUS | Status: DC
  Administered 2017-10-02 – 2017-10-04 (×3): 10 [IU] via SUBCUTANEOUS
  Filled 2017-10-02 (×3): qty 0.1

## 2017-10-02 MED ORDER — SODIUM CHLORIDE 0.9 % IV SOLN: 250.0000 mL | INTRAVENOUS | Status: DC | PRN

## 2017-10-02 MED ORDER — MOVING RIGHT ALONG BOOK
Freq: Once | Status: AC
  Administered 2017-10-02: 12:00:00
  Filled 2017-10-02: qty 1

## 2017-10-02 MED ORDER — SODIUM CHLORIDE 0.9% FLUSH: 3.0000 mL | INTRAVENOUS | Status: DC | PRN

## 2017-10-02 MED ORDER — POTASSIUM CHLORIDE CRYS ER 20 MEQ PO TBCR
20.0000 meq | EXTENDED_RELEASE_TABLET | Freq: Two times a day (BID) | ORAL | Status: DC
  Administered 2017-10-02 – 2017-10-04 (×5): 20 meq via ORAL
  Filled 2017-10-02 (×5): qty 1

## 2017-10-02 MED ORDER — INSULIN ASPART 100 UNIT/ML ~~LOC~~ SOLN
0.0000 [IU] | Freq: Three times a day (TID) | SUBCUTANEOUS | Status: DC
  Administered 2017-10-02: 12 [IU] via SUBCUTANEOUS
  Administered 2017-10-02: 8 [IU] via SUBCUTANEOUS
  Administered 2017-10-02 – 2017-10-03 (×2): 4 [IU] via SUBCUTANEOUS
  Administered 2017-10-03: 12 [IU] via SUBCUTANEOUS
  Administered 2017-10-04 (×2): 2 [IU] via SUBCUTANEOUS

## 2017-10-02 MED ORDER — SODIUM CHLORIDE 0.9% FLUSH
3.0000 mL | Freq: Two times a day (BID) | INTRAVENOUS | Status: DC
  Administered 2017-10-02 – 2017-10-04 (×4): 3 mL via INTRAVENOUS

## 2017-10-02 MED ORDER — MAGNESIUM HYDROXIDE 400 MG/5ML PO SUSP: 30.0000 mL | Freq: Every day | ORAL | Status: DC | PRN

## 2017-10-02 NOTE — Progress Notes (Signed)
[facility] contacted to give report on patient. RN not available at this time. Will continue to monitor.

## 2017-10-02 NOTE — Progress Notes (Signed)
Received Dan Lester from 2 H.  Patient is awake, alert and oriented x 4.  States he has soreness in the chest from moving to the new department.  Dressing to the anterior chest CDI. Bilateral dressing to upper abdomen with pacer wires tapped, CDI.  IV in the right wrist saline lock.  Placed on cardiac monitor, CCMD contacted.  Orientation to department and room given to the patient and family.  Verbalized understanding.

## 2017-10-02 NOTE — Progress Notes (Signed)
2 Days Post-Op Procedure(s) (LRB): CORONARY ARTERY BYPASS GRAFTING (CABG) times five using left internal mammary artery to left anterior descending and endoscopically harvested right saphenous vein graft (N/A) TRANSESOPHAGEAL ECHOCARDIOGRAM (TEE) (N/A) Subjective: Doing well  Ready to transfer  Objective: Vital signs in last 24 hours: Temp:  [96.5 F (35.8 C)-98.3 F (36.8 C)] 97 F (36.1 C) (06/09 0800) Pulse Rate:  [64-86] 73 (06/09 0500) Cardiac Rhythm: Normal sinus rhythm (06/09 0400) Resp:  [11-54] 13 (06/09 0900) BP: (89-125)/(56-90) 125/64 (06/09 0900) SpO2:  [90 %-99 %] 97 % (06/09 0800) Arterial Line BP: (129-142)/(56-63) 142/63 (06/08 1200)  Hemodynamic parameters for last 24 hours:  nsr  Intake/Output from previous day: 06/08 0701 - 06/09 0700 In: 727.7 [P.O.:120; I.V.:407.7; IV Piggyback:200] Out: 3220 [Urine:1055; Chest Tube:10] Intake/Output this shift: No intake/output data recorded.       Exam    General- alert and comfortable    Neck- no JVD, no cervical adenopathy palpable, no carotid bruit   Lungs- clear without rales, wheezes   Cor- regular rate and rhythm, no murmur , gallop   Abdomen- soft, non-tender   Extremities - warm, non-tender, minimal edema   Neuro- oriented, appropriate, no focal weakness   Lab Results: Recent Labs    10/01/17 1609 10/01/17 1615 10/02/17 0400  WBC 10.8*  --  9.8  HGB 10.1* 9.5* 9.7*  HCT 29.8* 28.0* 28.6*  PLT 116*  --  108*   BMET:  Recent Labs    10/01/17 0313  10/01/17 1615 10/02/17 0400  NA 135  --  137 135  K 4.1  --  4.3 4.1  CL 107  --  98* 103  CO2 24  --   --  27  GLUCOSE 87  --  186* 123*  BUN 15  --  20 23*  CREATININE 0.85   < > 0.90 1.00  CALCIUM 8.2*  --   --  8.2*   < > = values in this interval not displayed.    PT/INR:  Recent Labs    09/30/17 1414  LABPROT 17.2*  INR 1.42   ABG    Component Value Date/Time   PHART 7.360 10/01/2017 0555   HCO3 21.7 10/01/2017 0555   TCO2  21 (L) 10/01/2017 1615   ACIDBASEDEF 3.0 (H) 10/01/2017 0555   O2SAT 97.0 10/01/2017 0555   CBG (last 3)  Recent Labs    10/01/17 2322 10/02/17 0405 10/02/17 0802  GLUCAP 179* 114* 124*    Assessment/Plan: S/P Procedure(s) (LRB): CORONARY ARTERY BYPASS GRAFTING (CABG) times five using left internal mammary artery to left anterior descending and endoscopically harvested right saphenous vein graft (N/A) TRANSESOPHAGEAL ECHOCARDIOGRAM (TEE) (N/A) Mobilize Diuresis Diabetes control Transfer to 4 E  LOS: 5 days    Dan Lester 10/02/2017

## 2017-10-02 NOTE — Op Note (Signed)
NAME: MIRKO, TAILOR MEDICAL RECORD QB:[id number] ACCOUNT [id number] DATE OF BIRTH:Mar 11, 1946 FACILITY: [facility] LOCATION: [facility] PHYSICIAN:Damien Cisar Maryruth Bun, MD  OPERATIVE REPORT  DATE OF PROCEDURE:  09/30/2017  PREOPERATIVE DIAGNOSIS:  Recent non-STEMI myocardial infarction.  POSTOPERATIVE DIAGNOSIS:  Recent non-STEMI myocardial infarction.  SURGICAL PROCEDURE:  Coronary artery bypass grafting x5 with the left internal mammary to the left anterior descending coronary artery, reverse saphenous vein graft to the first diagonal coronary artery, sequential reverse saphenous vein graft to the  obtuse marginal and distal circumflex and reverse saphenous vein graft to the proximal posterior descending coronary artery with right greater saphenous and calf endoscopic vein harvesting.  SURGEON:   Lilia Argue. Servando Snare, MD  FIRST ASSISTANT:  Scheryl Darter, PA  BRIEF HISTORY:  The patient is a 72 year old male with no previous history of coronary artery disease who presents with rapidly increasing symptoms of unstable angina.  He was admitted through the emergency room with a positive troponin greater than 6.   He was stabilized medically on IV nitroglycerin and heparin infusion.  He underwent cardiac catheterization by Dr. Tamala Julian, which demonstrated severe 3-vessel coronary artery disease with 90% stenosis of the mid right coronary artery 50-60% stenosis in the  proximal posterior descending.  Diffuse disease in the distal posterior descending.  Had a relatively small distal LAD with complex disease involving greater than 80% involving the LAD and a large diagonal branch second small diagonal branch was  subtotally occluded.  He also had 80% stenosis in the obtuse marginal and distal circumflex.  Overall, ventricular function was preserved with some slight inferior hypokinesis.  The patient has had a long history of diabetes.  Risks and options of  surgery and coronary revascularization was discussed  with the patient and his wife in detail.  With complex three vessel disease, coronary artery bypass grafting was recommended to him.  He agreed and signed informed consent.  DESCRIPTION OF PROCEDURE:  With Swan-Ganz and arterial line monitors were placed, patient underwent general endotracheal anesthesia without incident.  Skin of the chest and the leg was prepped with Betadine, draped in the usual sterile manner.   Appropriate timeout was performed and we proceeded with endoscopic vein harvesting of the right greater saphenous vein in the thigh and lower leg.  The vein was of excellent quality and caliber.  Median sternotomy was performed.  Left internal mammary  artery was dissected down as a pedicle graft.  The distal artery had good free flow.  Pericardium was opened.  Overall, ventricular function appeared preserved.  He was systemically heparinized.  The ascending aorta was cannulated.  The right atrium was  cannulated.  The aortic root vent and cardioplegia needle was introduced into the ascending aorta.  The patient was placed on cardiopulmonary bypass,  2.4 liters per minute per mm2.  Sites of anastomosis were selected and dissected in the epicardium and  the patient's body temperature cooled to 32 degrees.  Aortic crossclamp was applied and 500 mL cold blood potassium cardioplegia was administered with diastolic arrest of the heart.  Myocardial septal temperature was monitored at the clamp.  Attention  was turned first to the proximal posterior descending coronary artery:  The vessel was of reasonable quality, was opened and admitted a 1.5 mm probe distally and proximally.  The distal right coronary artery was significantly calcified.  Very distal  posterior descending was a small vessel and thought too small to bypass.  Using a running 7-0 Prolene, a distal anastomosis was performed with a segment of  reverse saphenous vein graft.  The heart was then elevated and the first obtuse marginal and   distal circumflex were identified.  First obtuse marginal vessel was opened and admitted a 1.5 mm probe distally using a diamond type side-to-side anastomosis with a segment of reverse saphenous vein graft.  An 8-0 six anastomosis was completed.  Distal  extent of the same vein was then carried to the distal circumflex, which was a slightly larger vessel, was opened and admitted a 1.5 mm probe easily.  Using a running 8-0 Prolene, a distal anastomosis was performed.  Additional cold blood cardioplegia  was administered down the vein grafts.  Attention was then turned to the first diagonal vessel, which was at least as large as the distal LAD.  The vessel was opened and admitted a 1.5 mm probe distally.  Using a running 7-0 Prolene, distal anastomosis  was performed.  Attention was then turned to the left anterior descending coronary artery.  This was a relatively small thin walled vessel.  It did admit a 1 mm probe distally and 1.5 mm probe proximally.  The proximal portion of the vessel was  intramyocardial.  Using a running 8-0 Prolene, the left internal mammary artery was anastomosed to the left anterior descending coronary artery.  With a cross clamp still in place, 3 punch aortotomies were performed.  Each of the 3 vein grafts were  anastomosed to the ascending aorta.  Bulldog was removed from the mammary artery with prompt rise in myocardial septal temperature.  Heart was allowed to passively fill and deair and the proximal anastomoses were completed.  The aortic cross clamp was  removed with total crossclamp time of 107 minutes.  The patient required electrical defibrillation turned to return to sinus rhythm.  Sites of anastomoses were inspected and were free of bleeding.  The patient was then rewarmed to 37 degrees.  He was  then ventilated and weaned from cardiopulmonary bypass without difficulty.  He remained hemodynamically stable.  He was decannulated in the usual fashion.  Protamine sulfate was  administered with operative field was hemostatic.  Atrial and ventricular  pacing wires had been applied.  He was actually paced increased rate.  Pericardium was loosely reapproximated and the left pleural tube and a Blake mediastinal drain were left in place.  The sternum was closed with #6 stainless steel wire.  Fascia closed  with interrupted 0 Vicryl, running 3-0 Vicryl, subcutaneous tissue, 4-0 subcuticular stitch in skin edges.  Dry dressings were applied.  Sponge and needle count was reported correct at completion of the procedure with the exception of a small C1 needle.     Chest x-ray was obtained in the operating room and no foreign bodies were noted.  The patient was then transferred to the Cardiac Intensive Care Unit for further postoperative care.  ESTIMATED BLOOD LOSS:  Approximately 500 mL.    The patient did not require any blood bank blood transfusions during the procedure.    Scheryl Darter, PA was first assistant,  helping both with harvesting of vein and cannulation, construction of the anastomosis and closure.    Postoperatively, total pump time was 140 minutes.  AN/NUANCE  D:10/02/2017 T:10/02/2017 JOB:[id number]

## 2017-10-02 NOTE — Progress Notes (Signed)
[facility] called to give report for pateint transfer. RN not available at this time. Call back number left. Pt stable at this time. Will continue to monitor.

## 2017-10-03 ENCOUNTER — Inpatient Hospital Stay (HOSPITAL_COMMUNITY): Payer: 59

## 2017-10-03 ENCOUNTER — Encounter (HOSPITAL_COMMUNITY): Payer: Self-pay | Admitting: Cardiothoracic Surgery

## 2017-10-03 DIAGNOSIS — I4892 Unspecified atrial flutter: Secondary | ICD-10-CM

## 2017-10-03 DIAGNOSIS — I4891 Unspecified atrial fibrillation: Secondary | ICD-10-CM

## 2017-10-03 DIAGNOSIS — I9789 Other postprocedural complications and disorders of the circulatory system, not elsewhere classified: Secondary | ICD-10-CM

## 2017-10-03 LAB — GLUCOSE, CAPILLARY
GLUCOSE-CAPILLARY: 107 mg/dL — AB (ref 65–99)
GLUCOSE-CAPILLARY: 91 mg/dL (ref 65–99)
Glucose-Capillary: 254 mg/dL — ABNORMAL HIGH (ref 65–99)
Glucose-Capillary: 171 mg/dL — ABNORMAL HIGH (ref 65–99)

## 2017-10-03 LAB — BASIC METABOLIC PANEL
Anion gap: 7 (ref 5–15)
BUN: 22 mg/dL — ABNORMAL HIGH (ref 6–20)
CO2: 26 mmol/L (ref 22–32)
Calcium: 8.1 mg/dL — ABNORMAL LOW (ref 8.9–10.3)
Chloride: 104 mmol/L (ref 101–111)
Creatinine, Ser: 1.03 mg/dL (ref 0.61–1.24)
GFR calc Af Amer: >60 mL/min (ref >60)
GFR calc non Af Amer: >60 mL/min (ref >60)
Glucose, Bld: 116 mg/dL — ABNORMAL HIGH (ref 65–99)
Potassium: 4.1 mmol/L (ref 3.5–5.1)
Sodium: 137 mmol/L (ref 135–145)

## 2017-10-03 LAB — CBC
HCT: 27.7 % — ABNORMAL LOW (ref 39.0–52.0)
Hemoglobin: 9.2 g/dL — ABNORMAL LOW (ref 13.0–17.0)
MCH: 31.6 pg (ref 26.0–34.0)
MCHC: 33.2 g/dL (ref 30.0–36.0)
MCV: 95.2 fL (ref 78.0–100.0)
Platelets: 116 10*3/uL — ABNORMAL LOW (ref 150–400)
RBC: 2.91 MIL/uL — ABNORMAL LOW (ref 4.22–5.81)
RDW: 12.6 % (ref 11.5–15.5)
WBC: 9.5 10*3/uL (ref 4.0–10.5)

## 2017-10-03 MED ORDER — AMIODARONE HCL 200 MG PO TABS
400.0000 mg | ORAL_TABLET | Freq: Two times a day (BID) | ORAL | Status: DC
  Administered 2017-10-03 – 2017-10-04 (×2): 400 mg via ORAL
  Filled 2017-10-03 (×2): qty 2

## 2017-10-03 MED ORDER — METOPROLOL TARTRATE 25 MG/10 ML ORAL SUSPENSION: 12.5000 mg | Freq: Two times a day (BID) | ORAL | Status: DC

## 2017-10-03 MED ORDER — AMIODARONE IV BOLUS ONLY 150 MG/100ML
150.0000 mg | Freq: Once | INTRAVENOUS | Status: AC
  Administered 2017-10-03: 150 mg via INTRAVENOUS
  Filled 2017-10-03 (×3): qty 100

## 2017-10-03 MED ORDER — AMIODARONE HCL 200 MG PO TABS: 400.0000 mg | ORAL_TABLET | Freq: Every day | ORAL | Status: DC

## 2017-10-03 MED ORDER — METFORMIN HCL 500 MG PO TABS
500.0000 mg | ORAL_TABLET | Freq: Two times a day (BID) | ORAL | Status: DC
  Administered 2017-10-03 (×2): 500 mg via ORAL
  Filled 2017-10-03 (×2): qty 1

## 2017-10-03 MED ORDER — METOPROLOL TARTRATE 25 MG PO TABS
25.0000 mg | ORAL_TABLET | Freq: Two times a day (BID) | ORAL | Status: DC
  Administered 2017-10-03 – 2017-10-04 (×3): 25 mg via ORAL
  Filled 2017-10-03 (×3): qty 1

## 2017-10-03 MED FILL — Potassium Chloride Inj 2 mEq/ML: INTRAVENOUS | Qty: 40 | Status: AC

## 2017-10-03 MED FILL — Magnesium Sulfate Inj 50%: INTRAMUSCULAR | Qty: 10 | Status: AC

## 2017-10-03 MED FILL — Heparin Sodium (Porcine) Inj 1000 Unit/ML: INTRAMUSCULAR | Qty: 30 | Status: AC

## 2017-10-03 NOTE — Progress Notes (Signed)
Was ambulating pt and he went into Afib with rvr 110's to 130's pt tolerated ambulation otherwise well will pass on to md

## 2017-10-03 NOTE — Progress Notes (Signed)
  Amiodarone Drug - Drug Interaction Consult Note  Recommendations: Monitor K closely  Monitor HR Monitor Zofran use/QTc Amiodarone is metabolized by the cytochrome P450 system and therefore has the potential to cause many drug interactions. Amiodarone has an average plasma half-life of 50 days (range 20 to 100 days).   There is potential for drug interactions to occur several weeks or months after stopping treatment and the onset of drug interactions may be slow after initiating amiodarone.   []  Statins: Increased risk of myopathy. Simvastatin- restrict dose to 20mg  daily. Other statins: counsel patients to report any muscle pain or weakness immediately.  []  Anticoagulants: Amiodarone can increase anticoagulant effect. Consider warfarin dose reduction. Patients should be monitored closely and the dose of anticoagulant altered accordingly, remembering that amiodarone levels take several weeks to stabilize.  []  Antiepileptics: Amiodarone can increase plasma concentration of phenytoin, the dose should be reduced. Note that small changes in phenytoin dose can result in large changes in levels. Monitor patient and counsel on signs of toxicity.  [x]  Beta blockers: increased risk of bradycardia, AV block and myocardial depression. Sotalol - avoid concomitant use.  []   Calcium channel blockers (diltiazem and verapamil): increased risk of bradycardia, AV block and myocardial depression.  []   Cyclosporine: Amiodarone increases levels of cyclosporine. Reduced dose of cyclosporine is recommended.  []  Digoxin dose should be halved when amiodarone is started.  [x]  Diuretics: increased risk of cardiotoxicity if hypokalemia occurs.  []  Oral hypoglycemic agents (glyburide, glipizide, glimepiride): increased risk of hypoglycemia. Patient's glucose levels should be monitored closely when initiating amiodarone therapy.   [x]  Drugs that prolong the QT interval:  Torsades de pointes risk may be increased  with concurrent use - avoid if possible.  Monitor QTc, also keep magnesium/potassium WNL if concurrent therapy can't be avoided. Marland Kitchen Antibiotics: e.g. fluoroquinolones, erythromycin. . Antiarrhythmics: e.g. quinidine, procainamide, disopyramide, sotalol. . Antipsychotics: e.g. phenothiazines, haloperidol.  . Lithium, tricyclic antidepressants, and methadone. Thank You,  Narda Bonds  10/03/2017 7:47 AM

## 2017-10-03 NOTE — Progress Notes (Addendum)
[address]       [place] [address]             [phone]      3 Days Post-Op Procedure(s) (LRB): CORONARY ARTERY BYPASS GRAFTING (CABG) times five using left internal mammary artery to left anterior descending and endoscopically harvested right saphenous vein graft (N/A) TRANSESOPHAGEAL ECHOCARDIOGRAM (TEE) (N/A) Subjective: Just went into afib with RVR, + PVC's as well. Some palpitations but otherwise feels well   Objective: Vital signs in last 24 hours: Temp:  [97 F (36.1 C)-98.7 F (37.1 C)] 97.9 F (36.6 C) (06/10 0457) Pulse Rate:  [70-92] 92 (06/10 0457) Cardiac Rhythm: Supraventricular tachycardia (06/10 0641) Resp:  [13-29] 21 (06/10 0457) BP: (86-140)/(52-90) 136/75 (06/10 0457) SpO2:  [89 %-97 %] 96 % (06/10 0457) Weight:  [85.8 kg (189 lb 1.6 oz)] 85.8 kg (189 lb 1.6 oz) (06/10 0457)  Hemodynamic parameters for last 24 hours:    Intake/Output from previous day: 06/09 0701 - 06/10 0700 In: 620 [P.O.:620] Out: 395 [Urine:395] Intake/Output this shift: No intake/output data recorded.  General appearance: alert, cooperative and no distress Heart: irregularly irregular rhythm and tachy Lungs: clear to auscultation bilaterally Abdomen: benign Extremities: + minor edema Wound: dressings CDI  Lab Results: Recent Labs    10/02/17 0400 10/03/17 0318  WBC 9.8 9.5  HGB 9.7* 9.2*  HCT 28.6* 27.7*  PLT 108* 116*   BMET:  Recent Labs    10/02/17 0400 10/03/17 0318  NA 135 137  K 4.1 4.1  CL 103 104  CO2 27 26  GLUCOSE 123* 116*  BUN 23* 22*  CREATININE 1.00 1.03  CALCIUM 8.2* 8.1*    PT/INR:  Recent Labs    09/30/17 1414  LABPROT 17.2*  INR 1.42   ABG    Component Value Date/Time   PHART 7.360 10/01/2017 0555   HCO3 21.7 10/01/2017 0555   TCO2 21 (L) 10/01/2017 1615   ACIDBASEDEF 3.0 (H) 10/01/2017 0555   O2SAT 97.0 10/01/2017 0555   CBG (last 3)  Recent Labs    10/02/17 1601 10/02/17 2129 10/03/17 0558   GLUCAP 197* 172* 107*    Meds Scheduled Meds: . acetaminophen  1,000 mg Oral Q6H   Or  . acetaminophen (TYLENOL) oral liquid 160 mg/5 mL  1,000 mg Per Tube Q6H  . aspirin EC  325 mg Oral Daily   Or  . aspirin  324 mg Per Tube Daily  . atorvastatin  80 mg Oral q1800  . bisacodyl  10 mg Oral Daily   Or  . bisacodyl  10 mg Rectal Daily  . buPROPion  150 mg Oral Daily  . chlorhexidine gluconate (MEDLINE KIT)  15 mL Mouth Rinse BID  . Chlorhexidine Gluconate Cloth  6 each Topical Daily  . docusate sodium  200 mg Oral Daily  . furosemide  40 mg Oral Daily  . insulin aspart  0-24 Units Subcutaneous TID AC & HS  . insulin detemir  10 Units Subcutaneous Daily  . levothyroxine  200 mcg Oral QAC breakfast  . mouth rinse  15 mL Mouth Rinse BID  . metoprolol tartrate  12.5 mg Oral BID   Or  . metoprolol tartrate  12.5 mg Per Tube BID  . mometasone-formoterol  2 puff Inhalation BID  . pantoprazole  40 mg Oral Daily  . potassium chloride  20 mEq Oral BID  . sodium chloride flush  10-40 mL Intracatheter Q12H  . sodium chloride  flush  3 mL Intravenous Q12H  . sodium chloride flush  3 mL Intravenous Q12H   Continuous Infusions: . sodium chloride     PRN Meds:.sodium chloride, magnesium hydroxide, Melatonin, metoprolol tartrate, ondansetron (ZOFRAN) IV, oxyCODONE, sodium chloride flush, sodium chloride flush, sodium chloride flush, traMADol  Xrays Dg Chest Port 1 View  Result Date: 10/02/2017 CLINICAL DATA:  CABG EXAM: PORTABLE CHEST 1 VIEW COMPARISON:  Chest radiograph from one day prior. FINDINGS: Right rotated chest radiograph. Right internal jugular central venous sheath terminates in middle third of the SVC, with interval removal of the Swan-Ganz catheter. Intact sternotomy wires. Stable cardiomediastinal silhouette with top-normal heart size. No pneumothorax. No significant right pleural effusion. Stable small left pleural effusion. Removal of left chest tube. No pulmonary edema. Patchy  bibasilar atelectasis is stable. IMPRESSION: 1. No pneumothorax.  Removal of left chest tube. 2. Stable small left pleural effusion. 3. Stable bibasilar atelectasis. Electronically Signed   By: Ilona Sorrel M.D.   On: 10/02/2017 07:23    Assessment/Plan: S/P Procedure(s) (LRB): CORONARY ARTERY BYPASS GRAFTING (CABG) times five using left internal mammary artery to left anterior descending and endoscopically harvested right saphenous vein graft (N/A) TRANSESOPHAGEAL ECHOCARDIOGRAM (TEE) (N/A)  1 doing well but just went into afib, K+ is 4.1, most recent MG++ 2.4will add po amio.  2 BS control is fair. A1C 6.9, will resume metformin and ask DM coordinator to see as needs better management 3 H/H fairly stable, monitor 4 cont gentle diuresis for volume overload 5 routine post op rehab and pulm toilet  LOS: 6 days    Dan Lester 10/03/2017  Started on  Cordarone this am for a fib Now in sinus Pos home Tuesday or Wednesday  I have seen and examined Dan Lester and agree with the above assessment  and plan.  [name] MD Beeper [phone] Office [phone] 10/03/2017 3:22 PM

## 2017-10-03 NOTE — Progress Notes (Signed)
Progress Note  Patient Name: Dan Lester Date of Encounter: 10/03/2017  Primary Cardiologist: No primary care provider on file.   Subjective   Patient reported feeling a fluttering in his chest this morning. States he has felt this sensation in the past. No formal diagnosis of atrial fibrillation. Denies chest pain or SOB.   Inpatient Medications    Scheduled Meds: . acetaminophen  1,000 mg Oral Q6H   Or  . acetaminophen (TYLENOL) oral liquid 160 mg/5 mL  1,000 mg Per Tube Q6H  . amiodarone  400 mg Oral Q12H   Followed by  . [START ON 10/11/2017] amiodarone  400 mg Oral Daily  . aspirin EC  325 mg Oral Daily   Or  . aspirin  324 mg Per Tube Daily  . atorvastatin  80 mg Oral q1800  . bisacodyl  10 mg Oral Daily   Or  . bisacodyl  10 mg Rectal Daily  . buPROPion  150 mg Oral Daily  . chlorhexidine gluconate (MEDLINE KIT)  15 mL Mouth Rinse BID  . Chlorhexidine Gluconate Cloth  6 each Topical Daily  . docusate sodium  200 mg Oral Daily  . furosemide  40 mg Oral Daily  . insulin aspart  0-24 Units Subcutaneous TID AC & HS  . insulin detemir  10 Units Subcutaneous Daily  . levothyroxine  200 mcg Oral QAC breakfast  . mouth rinse  15 mL Mouth Rinse BID  . metFORMIN  500 mg Oral BID WC  . metoprolol tartrate  25 mg Oral BID   Or  . metoprolol tartrate  12.5 mg Per Tube BID  . mometasone-formoterol  2 puff Inhalation BID  . pantoprazole  40 mg Oral Daily  . potassium chloride  20 mEq Oral BID  . sodium chloride flush  10-40 mL Intracatheter Q12H  . sodium chloride flush  3 mL Intravenous Q12H  . sodium chloride flush  3 mL Intravenous Q12H   Continuous Infusions: . sodium chloride     PRN Meds: sodium chloride, magnesium hydroxide, Melatonin, metoprolol tartrate, ondansetron (ZOFRAN) IV, oxyCODONE, sodium chloride flush, sodium chloride flush, sodium chloride flush, traMADol   Vital Signs    Vitals:   10/02/17 1935 10/02/17 2126 10/03/17 0457 10/03/17 0811  BP:  140/72  136/75 113/72  Pulse: 78  92   Resp: 18  (!) 21   Temp: 98.7 F (37.1 C)  97.9 F (36.6 C)   TempSrc: Oral  Oral   SpO2: 95% 96% 96%   Weight:   189 lb 1.6 oz (85.8 kg)   Height:        Intake/Output Summary (Last 24 hours) at 10/03/2017 0933 Last data filed at 10/03/2017 0111 Gross per 24 hour  Intake 500 ml  Output 300 ml  Net 200 ml   Filed Weights   09/30/17 0257 10/01/17 0500 10/03/17 0457  Weight: 179 lb 0.2 oz (81.2 kg) 186 lb 11.2 oz (84.7 kg) 189 lb 1.6 oz (85.8 kg)    Telemetry    Episode of Atrial flutter with RVR with variable AV block this AM, now back in NSR - Personally Reviewed  ECG    10/03/17 with sinus tachycardia with 1st degree AV block, frequent PACs, and PVC- Personally Reviewed  Physical Exam   GEN: Laying in bed in no acute distress.   Neck: JVD not assessed; dressing over right neck c/d/i Cardiac: RRR, no murmurs, rubs, or gallops. Midline incision healing nicely Respiratory: Clear to auscultation bilaterally, no wheezes/  rales/ rhonchi GI: NABS, Soft, nontender, non-distended  MS: 1+ b/l LE edema; No deformity. Neuro:  Nonfocal, moving all extremities spontaneously Psych: Normal affect   Labs    Chemistry Recent Labs  Lab 09/29/17 0403  10/01/17 0313 10/01/17 1609 10/01/17 1615 10/02/17 0400 10/03/17 0318  NA 138   < > 135  --  137 135 137  K 3.7   < > 4.1  --  4.3 4.1 4.1  CL 108   < > 107  --  98* 103 104  CO2 25   < > 24  --   --  27 26  GLUCOSE 143*   < > 87  --  186* 123* 116*  BUN 18   < > 15  --  20 23* 22*  CREATININE 1.03   < > 0.85 1.06 0.90 1.00 1.03  CALCIUM 8.5*   < > 8.2*  --   --  8.2* 8.1*  PROT 5.4*  --   --   --   --   --   --   ALBUMIN 2.9*  --   --   --   --   --   --   AST 32  --   --   --   --   --   --   ALT 20  --   --   --   --   --   --   ALKPHOS 69  --   --   --   --   --   --   BILITOT 0.6  --   --   --   --   --   --   GFRNONAA >60   < > >60 >60  --  >60 >60  GFRAA >60   < > >60 >60  --   >60 >60  ANIONGAP 5   < > 4*  --   --  5 7   < > = values in this interval not displayed.     Hematology Recent Labs  Lab 10/01/17 1609 10/01/17 1615 10/02/17 0400 10/03/17 0318  WBC 10.8*  --  9.8 9.5  RBC 3.19*  --  3.01* 2.91*  HGB 10.1* 9.5* 9.7* 9.2*  HCT 29.8* 28.0* 28.6* 27.7*  MCV 93.4  --  95.0 95.2  MCH 31.7  --  32.2 31.6  MCHC 33.9  --  33.9 33.2  RDW 12.3  --  12.6 12.6  PLT 116*  --  108* 116*    Cardiac Enzymes Recent Labs  Lab 09/27/17 0726 09/27/17 1233 09/27/17 1845  TROPONINI 2.49* 4.48* 6.25*    Recent Labs  Lab 09/27/17 0104  TROPIPOC 1.27*     BNPNo results for input(s): BNP, PROBNP in the last 168 hours.   DDimer No results for input(s): DDIMER in the last 168 hours.   Radiology    Dg Chest Port 1 View  Result Date: 10/03/2017 CLINICAL DATA:  Followup CABG EXAM: PORTABLE CHEST 1 VIEW COMPARISON:  10/02/2017 FINDINGS: Venous access sheath has been removed. Persistent effusions and basilar atelectasis, left more than right. No visible pleural air. No new finding otherwise. IMPRESSION: Venous access sheath removed. Bilateral effusions with lower lung atelectasis, left worse than right. Electronically Signed   By: Nelson Chimes M.D.   On: 10/03/2017 09:04   Dg Chest Port 1 View  Result Date: 10/02/2017 CLINICAL DATA:  CABG EXAM: PORTABLE CHEST 1 VIEW COMPARISON:  Chest radiograph from one day prior. FINDINGS:  Right rotated chest radiograph. Right internal jugular central venous sheath terminates in middle third of the SVC, with interval removal of the Swan-Ganz catheter. Intact sternotomy wires. Stable cardiomediastinal silhouette with top-normal heart size. No pneumothorax. No significant right pleural effusion. Stable small left pleural effusion. Removal of left chest tube. No pulmonary edema. Patchy bibasilar atelectasis is stable. IMPRESSION: 1. No pneumothorax.  Removal of left chest tube. 2. Stable small left pleural effusion. 3. Stable  bibasilar atelectasis. Electronically Signed   By: Ilona Sorrel M.D.   On: 10/02/2017 07:23    Cardiac Studies   Echo 09/27/17: Study Conclusions  - Left ventricle: The cavity size was normal. There was mild focal   basal hypertrophy of the septum. Systolic function was normal.   The estimated ejection fraction was in the range of 55% to 60%.   Possible mild hypokinesis of the inferior myocardium. There was   an increased relative contribution of atrial contraction to   ventricular filling. Doppler parameters are consistent with   abnormal left ventricular relaxation (grade 1 diastolic   dysfunction). - Mitral valve: Calcified annulus.  LHC 09/27/17: Conclusion    Severe multivessel coronary artery disease.  Normal left main.  Proximal LAD beyond the first diagonal 95%.  Mid 50% stenosis.  The first diagonal contains a subtotally occluded branch with TIMI grade II flow and 80% stenosis of the larger branch.  The circumflex coronary artery is diffusely diseased with greater than 90% obstruction in the first marginal and 95% stenosis in the continuation of the circumflex to the second obtuse marginal.  High-grade segmental 90% stenosis in the mid right coronary.  PDA contains 70% obstruction.  Overall LV function is lower normal with an estimated EF of 50%.  LVEDP is 21 mmHg.  Findings compatible with chronic diastolic heart failure.  RECOMMENDATIONS:   Given the diffuse nature of the disease in this diabetic, will recommend evaluation by cardiac surgery to determine if he is a candidate for surgical revascularization.    Patient Profile     72 y.o. male with PMH of chronic diastolic CHF, DM type 2, hyperthyroidism, who presented with CP and was admitted for NSTEMI. Found to have multivessel disease on LHC; now s/p CABG 09/30/17.   Assessment & Plan    1. CAD s/p CABG this admission: admitted for NSTEMI - trop peaked at 6.25. LHC revealed multivessel disease. Patient  underwent CABG 09/30/17.  - Continue management per primary team - Continue ASA and statin  2. Post-op atrial flutter: patient noted to have atrial flutter with RVR while working with PT this morning. No prior history of atrial fibrillation/flutter but the patient reports feeling similar palpitations in the past. Started on amiodarone load>po. Now back in NSR.  - This patients CHA2DS2-VASc Score and unadjusted Ischemic Stroke Rate (% per year) is equal to 4.8 % stroke rate/year from a score of 4 Above score calculated as 1 point each if present [CHF, HTN, DM, Vascular=MI/PAD/Aortic Plaque, Age if 65-74, or Male] Above score calculated as 2 points each if present [Age > 75, or Stroke/TIA/TE] - If atrial fibrillation/flutter appears persistent, patient would benefit from anticoagulation in the future. - Continue amiodarone for rhythm control - Continue metoprolol 30m BID  3. Chronic diastolic CHF: on gentle diuresis post-op. With some mild LE edema on exam; clear lungs - Continue po lasix  For questions or updates, please contact [facility] Please consult [email or web address] for contact info under Cardiology/STEMI.      Signed, KLorelee Cover  Kroeger, PA-C  10/03/2017, 9:33 AM   [phone]

## 2017-10-03 NOTE — Progress Notes (Signed)
CARDIAC REHAB PHASE I   PRE:  Rate/Rhythm: 82 SR  BP:  Supine: 105/73  Sitting:   Standing:    SaO2: 93%RA  MODE:  Ambulation: 350 ft   POST:  Rate/Rhythm: 93 SR  BP:  Supine:   Sitting: 105/93  Standing:    SaO2: 90%RA 1400-1425 Pt walked 350 ft on RA with rolling walker with little assistance. C/o pain at knee area where incision is located. Encouraged pt to take his time. Tolerated well and remained in NSR. Back to bed after walk. Encouraged one more walk today.   Graylon Good, RN BSN  10/03/2017 2:22 PM

## 2017-10-03 NOTE — Progress Notes (Signed)
Offered to walk with pt, pt stated he walked about an hour ago and went into Afib. Amio bolus given, RN requests we wait to see the pt. Will come back later to walk with pt.  Rufina Falco, RN BSN 10/03/2017 9:04 AM

## 2017-10-03 NOTE — Care Management Note (Signed)
Case Management Note Marvetta Gibbons RN, BSN Unit 4E-Case Manager [phone]  Patient Details  Name: Dan Lester MRN: [id number] Date of Birth: [birth year]  Subjective/Objective:   Pt admitted with STEMI/MVD- s/p CABGx5 on 09/30/17               Action/Plan: PTA pt lived at home with wife, independent, anticipate return home, CM to follow for transition of care needs  Expected Discharge Date:                  Expected Discharge Plan:  Home/Self Care  In-House Referral:     Discharge planning Services  CM Consult  Post Acute Care Choice:    Choice offered to:     DME Arranged:    DME Agency:     HH Arranged:    [place] Agency:     Status of Service:  In process, will continue to follow  If discussed at Long Length of Stay Meetings, dates discussed:    Discharge Disposition:   Additional Comments:  Dawayne Patricia, RN 10/03/2017, 10:19 AM

## 2017-10-03 NOTE — Progress Notes (Signed)
Inpatient Diabetes Program Recommendations  AACE/ADA: New Consensus Statement on Inpatient Glycemic Control (2015)  Target Ranges:  Prepandial:   less than 140 mg/dL      Peak postprandial:   less than 180 mg/dL (1-2 hours)      Critically ill patients:  140 - 180 mg/dL   Lab Results  Component Value Date   GLUCAP 254 (H) 10/03/2017   HGBA1C 6.9 (H) 09/29/2017    Review of Glycemic Control  Diabetes history: DM2 Outpatient Diabetes medications: Lantus 10 units QHS, metformin 500 mg bid Current orders for Inpatient glycemic control: Levemir 10 units QD, Novolog 0-24 units tidwc  HgbA1C - 6.7% - very good glycemic control at home. Pt states he checks blood sugars daily. Sees Dr. Reynaldo Minium for diabetes management. States weight is stable. Discussed HgbA1C of 6.7%. Pt had no questions regarding his diabetes.  Inpatient Diabetes Program Recommendations:     Continue with Levemir 10 units QD. Agree with 0-24 units tidwc. Add HS correction May need small amount of meal coverage insulin while inpatient. Novolog 3 units tidwc  For home: recommend continuing Lantus 10 units QHS and metformin 500 mg bid. F/U with PCP for diabetes management.  Will continue to follow.  Thank you. Lorenda Peck, RD, LDN, CDE Inpatient Diabetes Coordinator [phone]

## 2017-10-04 ENCOUNTER — Telehealth: Payer: Self-pay | Admitting: Cardiovascular Disease

## 2017-10-04 LAB — GLUCOSE, CAPILLARY
GLUCOSE-CAPILLARY: 124 mg/dL — AB (ref 65–99)
GLUCOSE-CAPILLARY: 159 mg/dL — AB (ref 65–99)

## 2017-10-04 MED ORDER — OXYCODONE HCL 5 MG PO TABS: 5.0000 mg | ORAL_TABLET | Freq: Four times a day (QID) | ORAL | 0 refills | Status: AC | PRN

## 2017-10-04 MED ORDER — METOPROLOL TARTRATE 25 MG PO TABS: 25.0000 mg | ORAL_TABLET | Freq: Two times a day (BID) | ORAL | 1 refills | Status: DC

## 2017-10-04 MED ORDER — AMIODARONE HCL 400 MG PO TABS: 400.0000 mg | ORAL_TABLET | Freq: Two times a day (BID) | ORAL | 1 refills | Status: DC

## 2017-10-04 MED ORDER — METFORMIN HCL 500 MG PO TABS
1000.0000 mg | ORAL_TABLET | Freq: Two times a day (BID) | ORAL | Status: DC
  Administered 2017-10-04: 1000 mg via ORAL
  Filled 2017-10-04: qty 2

## 2017-10-04 MED ORDER — ASPIRIN 325 MG PO TBEC: 325.0000 mg | DELAYED_RELEASE_TABLET | Freq: Every day | ORAL | Status: DC

## 2017-10-04 MED ORDER — ATORVASTATIN CALCIUM 80 MG PO TABS: 80.0000 mg | ORAL_TABLET | Freq: Every day | ORAL | 1 refills | Status: DC

## 2017-10-04 MED FILL — Mannitol IV Soln 20%: INTRAVENOUS | Qty: 500 | Status: AC

## 2017-10-04 MED FILL — Heparin Sodium (Porcine) Inj 1000 Unit/ML: INTRAMUSCULAR | Qty: 30 | Status: AC

## 2017-10-04 MED FILL — Sodium Bicarbonate IV Soln 8.4%: INTRAVENOUS | Qty: 50 | Status: AC

## 2017-10-04 MED FILL — Lidocaine HCl Local Soln Prefilled Syringe 100 MG/5ML (2%): INTRAMUSCULAR | Qty: 5 | Status: AC

## 2017-10-04 MED FILL — Electrolyte-R (PH 7.4) Solution: INTRAVENOUS | Qty: 4000 | Status: AC

## 2017-10-04 NOTE — Plan of Care (Signed)
  Problem: Education: Goal: Knowledge of General Education information will improve Outcome: Progressing   Problem: Education: Goal: Ability to demonstrate proper wound care will improve Outcome: Progressing Goal: Knowledge of disease or condition will improve Outcome: Progressing   Problem: Clinical Measurements: Goal: Postoperative complications will be avoided or minimized Outcome: Progressing   Problem: Respiratory: Goal: Respiratory status will improve Outcome: Progressing

## 2017-10-04 NOTE — Progress Notes (Signed)
10/04/2017 2:33 PM Discharge AVS meds taken today and those due this evening reviewed.  Follow-up appointments and when to call md reviewed.  D/C IV and TELE.  Questions and concerns addressed.   D/C home per orders. [place]

## 2017-10-04 NOTE — Progress Notes (Signed)
10/04/2017 1330 CT sutures D/C'd without difficulty.  Pt tolerated well. Carney Corners

## 2017-10-04 NOTE — Progress Notes (Signed)
[address]       [place] [address]             [phone]      4 Days Post-Op Procedure(s) (LRB): CORONARY ARTERY BYPASS GRAFTING (CABG) times five using left internal mammary artery to left anterior descending and endoscopically harvested right saphenous vein graft (N/A) TRANSESOPHAGEAL ECHOCARDIOGRAM (TEE) (N/A) Subjective: Feels well  Objective: Vital signs in last 24 hours: Temp:  [98.3 F (36.8 C)-98.9 F (37.2 C)] 98.6 F (37 C) (06/11 0347) Pulse Rate:  [78-88] 88 (06/10 1958) Cardiac Rhythm: Normal sinus rhythm (06/10 2000) Resp:  [17] 17 (06/10 1212) BP: (101-120)/(63-72) 101/63 (06/11 0347) SpO2:  [97 %-100 %] 100 % (06/10 1958) Weight:  [83.9 kg (185 lb)] 83.9 kg (185 lb) (06/11 0347)  Hemodynamic parameters for last 24 hours:    Intake/Output from previous day: 06/10 0701 - 06/11 0700 In: 360 [P.O.:240; I.V.:120] Out: -  Intake/Output this shift: No intake/output data recorded.  General appearance: alert, cooperative and no distress Heart: regular rate and rhythm and freq extrasystole Lungs: mildly dim in bases Abdomen: benign Extremities: min edema Wound: incis healing well  Lab Results: Recent Labs    10/02/17 0400 10/03/17 0318  WBC 9.8 9.5  HGB 9.7* 9.2*  HCT 28.6* 27.7*  PLT 108* 116*   BMET:  Recent Labs    10/02/17 0400 10/03/17 0318  NA 135 137  K 4.1 4.1  CL 103 104  CO2 27 26  GLUCOSE 123* 116*  BUN 23* 22*  CREATININE 1.00 1.03  CALCIUM 8.2* 8.1*    PT/INR: No results for input(s): LABPROT, INR in the last 72 hours. ABG    Component Value Date/Time   PHART 7.360 10/01/2017 0555   HCO3 21.7 10/01/2017 0555   TCO2 21 (L) 10/01/2017 1615   ACIDBASEDEF 3.0 (H) 10/01/2017 0555   O2SAT 97.0 10/01/2017 0555   CBG (last 3)  Recent Labs    10/03/17 1642 10/03/17 2115 10/04/17 0611  GLUCAP 91 171* 124*    Meds Scheduled Meds: . acetaminophen  1,000 mg Oral Q6H   Or  . acetaminophen  (TYLENOL) oral liquid 160 mg/5 mL  1,000 mg Per Tube Q6H  . amiodarone  400 mg Oral Q12H   Followed by  . [START ON 10/11/2017] amiodarone  400 mg Oral Daily  . aspirin EC  325 mg Oral Daily   Or  . aspirin  324 mg Per Tube Daily  . atorvastatin  80 mg Oral q1800  . bisacodyl  10 mg Oral Daily   Or  . bisacodyl  10 mg Rectal Daily  . buPROPion  150 mg Oral Daily  . chlorhexidine gluconate (MEDLINE KIT)  15 mL Mouth Rinse BID  . Chlorhexidine Gluconate Cloth  6 each Topical Daily  . docusate sodium  200 mg Oral Daily  . furosemide  40 mg Oral Daily  . insulin aspart  0-24 Units Subcutaneous TID AC & HS  . insulin detemir  10 Units Subcutaneous Daily  . levothyroxine  200 mcg Oral QAC breakfast  . mouth rinse  15 mL Mouth Rinse BID  . metFORMIN  500 mg Oral BID WC  . metoprolol tartrate  25 mg Oral BID   Or  . metoprolol tartrate  12.5 mg Per Tube BID  . mometasone-formoterol  2 puff Inhalation BID  . pantoprazole  40 mg Oral Daily  . potassium chloride  20 mEq Oral BID  .  sodium chloride flush  10-40 mL Intracatheter Q12H  . sodium chloride flush  3 mL Intravenous Q12H  . sodium chloride flush  3 mL Intravenous Q12H   Continuous Infusions: . sodium chloride     PRN Meds:.sodium chloride, magnesium hydroxide, Melatonin, metoprolol tartrate, ondansetron (ZOFRAN) IV, oxyCODONE, sodium chloride flush, sodium chloride flush, sodium chloride flush, traMADol  Xrays Dg Chest Port 1 View  Result Date: 10/03/2017 CLINICAL DATA:  Followup CABG EXAM: PORTABLE CHEST 1 VIEW COMPARISON:  10/02/2017 FINDINGS: Venous access sheath has been removed. Persistent effusions and basilar atelectasis, left more than right. No visible pleural air. No new finding otherwise. IMPRESSION: Venous access sheath removed. Bilateral effusions with lower lung atelectasis, left worse than right. Electronically Signed   By: Nelson Chimes M.D.   On: 10/03/2017 09:04    Assessment/Plan: S/P Procedure(s)  (LRB): CORONARY ARTERY BYPASS GRAFTING (CABG) times five using left internal mammary artery to left anterior descending and endoscopically harvested right saphenous vein graft (N/A) TRANSESOPHAGEAL ECHOCARDIOGRAM (TEE) (N/A)  1 maintaining sinus rhythm on amio/metoprolol- freq PVC's.will d/c pacer wires this am, EF prop is normal. BP is well controlled 2 BS control is fair- will need close f/u with Dr Reynaldo Minium, will increase metformin to 1 g BID 3 no new labs 4 poss home later today or tomorrow  LOS: 7 days    [name] 10/04/2017

## 2017-10-04 NOTE — Discharge Summary (Signed)
Physician Discharge Summary  Patient ID: Dan Lester MRN: [id number] DOB/AGE: 06-04-1945 72 y.o.  Admit date: 09/27/2017 Discharge date: 10/04/2017  Admission Diagnoses: Acute coronary syndrome  Discharge Diagnoses:  Principal Problem:   ACS (acute coronary syndrome) [facility]) Active Problems:   NSTEMI (non-ST elevated myocardial infarction) [facility])   Coronary artery disease   Atrial flutter [facility])   Patient Active Problem List   Diagnosis Date Noted  . Atrial flutter ([place])   . Coronary artery disease 09/30/2017  . ACS (acute coronary syndrome) ([place]) 09/27/2017  . NSTEMI (non-ST elevated myocardial infarction) ([place])   . Stage 4 pressure ulcer ([place]) 03/22/2017  . Abscess of right thigh 11/06/2013  . Chronic cholecystitis with calculus 10/19/2013  . Hernia, inguinal, left 12/17/2011   History of present illness: The patient is a 72 year old male who presented to the emergency department with symptoms of chest pain.  He reported some intermittent chest pain over the previous several days but had onset of a more severe episode approximately 10 PM on the date of of admission.  Initial EKG showed biphasic T wave changes in lead III and initial troponin was positive.  He was suspected to have a true non-STEMI and was given aspirin placed on intravenous nitroglycerin and heparin and cardiology consultation was obtained.  The patient has multiple iliac risk factors including long-standing history of diabetes and hyperlipidemia. Discharged Condition: good  Hospital Course: The patient was admitted and he ruled in for non-STEMI.  He was seen in cardiology consultation.  EKG showed some ST segment depression in the inferior and lateral leads.  Peak troponin was 6.25.  He was subsequently scheduled for cardiac catheterization which was undertaken on 09/27/2017 and showed severe multivessel coronary artery disease.  Due to these findings cardiothoracic surgical consultation was obtained with Lanelle Bal, MD who evaluated the patient and studies and agreed for recommendations to proceed with coronary artery surgical revascularization.  The patient was medically stabilized and on 10/02/2017 taken to the operating room where he underwent the below described procedure.  He tolerated it well and was taken to the surgical intensive care unit in stable condition.   Postoperative hospital course:  The patient has progressed quite nicely.  He was weaned from the ventilator using standard postoperative protocols without difficulty.  He has remained neurologically intact.  All routine lines, monitors and drainage devices have been discontinued in the standard fashion.  He does have an expected acute blood loss anemia and values are stable.  Most recent hemoglobin and hematocrit are 9.2/27.7.  Renal function has remained within normal limits.  He did have an episode of postoperative atrial fibrillation and was subsequently chemically cardioverted to sinus rhythm with amiodarone and metoprolol.  He is tolerating gradually increasing activities using standard postoperative protocols.  His blood sugars have been under adequate control using usual measures with transition to his home medication regimen.  He was seen by the diabetes coordinator as well to assist with long-term management of diabetes in the setting of severe coronary artery disease.  His blood pressure has been on the low relative side and he is not a candidate for ace inhibitor or ARB at this time.  He has been started on a statin.  His incisions are noted to be healing well without evidence of infection.  He is tolerating diet.  Oxygen has been weaned and he maintains good saturations on room air.  He is tolerating gradually increasing activities using usual cardiac rehab modalities.  At the time of  discharge she is felt to be quite stable.    Consults: cardiology  Significant Diagnostic Studies: angiography: Cardiac catheterization  Treatments:  surgery:   OPERATIVE REPORT  DATE OF PROCEDURE:  09/30/2017  PREOPERATIVE DIAGNOSIS:  Recent non-STEMI myocardial infarction.  POSTOPERATIVE DIAGNOSIS:  Recent non-STEMI myocardial infarction.  SURGICAL PROCEDURE:  Coronary artery bypass grafting x5 with the left internal mammary to the left anterior descending coronary artery, reverse saphenous vein graft to the first diagonal coronary artery, sequential reverse saphenous vein graft to the  obtuse marginal and distal circumflex and reverse saphenous vein graft to the proximal posterior descending coronary artery with right greater saphenous and calf endoscopic vein harvesting.  SURGEON:   Lilia Argue. Servando Snare, MD  FIRST ASSISTANT:  Scheryl Darter, PA    Discharge Exam: Blood pressure 132/69, pulse 78, temperature 98.6 F (37 C), temperature source Oral, resp. rate 14, height 6\' 2"  (1.88 m), weight 83.9 kg (185 lb), SpO2 100 %.   General appearance: alert, cooperative and no distress Heart: regular rate and rhythm and freq extrasystole Lungs: mildly dim in bases Abdomen: benign Extremities: min edema Wound: incis healing well   Disposition: Discharge disposition: 01-Home or Self Care       Discharge Instructions    Amb Referral to Cardiac Rehabilitation   Complete by:  As directed    Diagnosis:  CABG   CABG X ___:  5   Discharge patient   Complete by:  As directed    Discharge disposition:  01-Home or Self Care   Discharge patient date:  10/04/2017     Allergies as of 10/04/2017   No Known Allergies     Medication List    STOP taking these medications   ibuprofen 200 MG tablet Commonly known as:  ADVIL,MOTRIN     TAKE these medications   amiodarone 400 MG tablet Commonly known as:  PACERONE Take 1 tablet (400 mg total) by mouth every 12 (twelve) hours. For 7 days then 400 mg once daily   aspirin 325 MG EC tablet Take 1 tablet (325 mg total) by mouth daily. Start taking on:  10/05/2017    atorvastatin 80 MG tablet Commonly known as:  LIPITOR Take 1 tablet (80 mg total) by mouth daily at 6 PM.   buPROPion 150 MG 24 hr tablet Commonly known as:  WELLBUTRIN XL Take 150 mg daily by mouth.   fluticasone 50 MCG/ACT nasal spray Commonly known as:  FLONASE Place 2 sprays daily into both nostrils.   Fluticasone-Salmeterol 250-50 MCG/DOSE Aepb Commonly known as:  ADVAIR Inhale 1 puff daily as needed into the lungs (shortness of breath).   insulin glargine 100 UNIT/ML injection Commonly known as:  LANTUS Inject 10 Units into the skin every evening.   MELATONIN PO Take 1 tablet at bedtime as needed by mouth (sleep).   metFORMIN 500 MG tablet Commonly known as:  GLUCOPHAGE Take 500 mg 2 (two) times daily with a meal by mouth.   metoprolol tartrate 25 MG tablet Commonly known as:  LOPRESSOR Take 1 tablet (25 mg total) by mouth 2 (two) times daily.   multivitamin with minerals Tabs tablet Take 1 tablet daily by mouth.   oxyCODONE 5 MG immediate release tablet Commonly known as:  Oxy IR/ROXICODONE Take 1-2 tablets (5-10 mg total) by mouth every 6 (six) hours as needed for up to 7 days for severe pain.   pantoprazole 40 MG tablet Commonly known as:  PROTONIX Take 40 mg daily by mouth.   SUPER  B COMPLEX/C Caps Take 1 tablet daily by mouth.   SYNTHROID 200 MCG tablet Generic drug:  levothyroxine Take 200 mcg every evening by mouth.       The patient has been discharged on:   1.Beta Blocker:  Yes [ y ]                              No   [   ]                              If No, reason:  2.Ace Inhibitor/ARB: Yes [   ]                                     No  [ n   ]                                     If No, reason:low relative BP  3.Statin:   Yes [ y  ]                  No  [   ]                  If No, reason:  4.Ecasa:  Yes  [ y  ]                  No   [   ]                  If No, reason:   Signed: [name] 10/04/2017, 12:11 PM

## 2017-10-04 NOTE — Discharge Instructions (Signed)
Endoscopic Saphenous Vein Harvesting, Care After °Refer to this sheet in the next few weeks. These instructions provide you with information about caring for yourself after your procedure. Your health care provider may also give you more specific instructions. Your treatment has been planned according to current medical practices, but problems sometimes occur. Call your health care provider if you have any problems or questions after your procedure. °What can I expect after the procedure? °After the procedure, it is common to have: °· Pain. °· Bruising. °· Swelling. °· Numbness. ° °Follow these instructions at home: °Medicine °· Take over-the-counter and prescription medicines only as told by your health care provider. °· Do not drive or operate heavy machinery while taking prescription pain medicine. °Incision care ° °· Follow instructions from your health care provider about how to take care of the cut made during surgery (incision). Make sure you: °? Wash your hands with soap and water before you change your bandage (dressing). If soap and water are not available, use hand sanitizer. °? Change your dressing as told by your health care provider. °? Leave stitches (sutures), skin glue, or adhesive strips in place. These skin closures may need to be in place for 2 weeks or longer. If adhesive strip edges start to loosen and curl up, you may trim the loose edges. Do not remove adhesive strips completely unless your health care provider tells you to do that. °· Check your incision area every day for signs of infection. Check for: °? More redness, swelling, or pain. °? More fluid or blood. °? Warmth. °? Pus or a bad smell. °General instructions °· Raise (elevate) your legs above the level of your heart while you are sitting or lying down. °· Do any exercises your health care providers have given you. These may include deep breathing, coughing, and walking exercises. °· Do not shower, take baths, swim, or use a hot tub  unless told by your health care provider. °· Wear your elastic stocking if told by your health care provider. °· Keep all follow-up visits as told by your health care provider. This is important. °Contact a health care provider if: °· Medicine does not help your pain. °· Your pain gets worse. °· You have new leg bruises or your leg bruises get bigger. °· You have a fever. °· Your leg feels numb. °· You have more redness, swelling, or pain around your incision. °· You have more fluid or blood coming from your incision. °· Your incision feels warm to the touch. °· You have pus or a bad smell coming from your incision. °Get help right away if: °· Your pain is severe. °· You develop pain, tenderness, warmth, redness, or swelling in any part of your leg. °· You have chest pain. °· You have trouble breathing. °This information is not intended to replace advice given to you by your health care provider. Make sure you discuss any questions you have with your health care provider. °Document Released: 12/23/2010 Document Revised: 09/18/2015 Document Reviewed: 02/24/2015 °Elsevier Interactive Patient Education © 2018 [employer] °Coronary Artery Bypass Grafting, Care After °These instructions give you information on caring for yourself after your procedure. Your doctor may also give you more specific instructions. Call your doctor if you have any problems or questions after your procedure. °Follow these instructions at home: °· Only take medicine as told by your doctor. Take medicines exactly as told. Do not stop taking medicines or start any new medicines without talking to your doctor first. °·   Take your pulse as told by your doctor. °· Do deep breathing as told by your doctor. Use your breathing device (incentive spirometer), if given, to practice deep breathing several times a day. Support your chest with a pillow or your arms when you take deep breaths or cough. °· Keep the area clean, dry, and protected where the  surgery cuts (incisions) were made. Remove bandages (dressings) only as told by your doctor. If strips were applied to surgical area, do not take them off. They fall off on their own. °· Check the surgery area daily for puffiness (swelling), redness, or leaking fluid. °· If surgery cuts were made in your legs: °? Avoid crossing your legs. °? Avoid sitting for long periods of time. Change positions every 30 minutes. °? Raise your legs when you are sitting. Place them on pillows. °· Wear stockings that help keep blood clots from forming in your legs (compression stockings). °· Only take sponge baths until your doctor says it is okay to take showers. Pat the surgery area dry. Do not rub the surgery area with a washcloth or towel. Do not bathe, swim, or use a hot tub until your doctor says it is okay. °· Eat foods that are high in fiber. These include raw fruits and vegetables, whole grains, beans, and nuts. Choose lean meats. Avoid canned, processed, and fried foods. °· Drink enough fluids to keep your pee (urine) clear or pale yellow. °· Weigh yourself every day. °· Rest and limit activity as told by your doctor. You may be told to: °? Stop any activity if you have chest pain, shortness of breath, changes in heartbeat, or dizziness. Get help right away if this happens. °? Move around often for short amounts of time or take short walks as told by your doctor. Gradually become more active. You may need help to strengthen your muscles and build endurance. °? Avoid lifting, pushing, or pulling anything heavier than 10 pounds (4.5 kg) for at least 6 weeks after surgery. °· Do not drive until your doctor says it is okay. °· Ask your doctor when you can go back to work. °· Ask your doctor when you can begin sexual activity again. °· Follow up with your doctor as told. °Contact a doctor if: °· You have puffiness, redness, more pain, or fluid draining from the incision site. °· You have a fever. °· You have puffiness in your  ankles or legs. °· You have pain in your legs. °· You gain 2 or more pounds (0.9 kg) a day. °· You feel sick to your stomach (nauseous) or throw up (vomit). °· You have watery poop (diarrhea). °Get help right away if: °· You have chest pain that goes to your jaw or arms. °· You have shortness of breath. °· You have a fast or irregular heartbeat. °· You notice a "clicking" in your breastbone when you move. °· You have numbness or weakness in your arms or legs. °· You feel dizzy or light-headed. °This information is not intended to replace advice given to you by your health care provider. Make sure you discuss any questions you have with your health care provider. °Document Released: 04/17/2013 Document Revised: 09/18/2015 Document Reviewed: 09/19/2012 °Elsevier Interactive Patient Education © 2017 Elsevier Inc. ° °

## 2017-10-04 NOTE — Progress Notes (Signed)
10/04/2017 0900 EPW D/C'd per order and per protocol.  Ends intact. Pt. Tolerated well.  Advised bedrest x1hr.  Call bell in reach.  Vital signs collected per protocol. [place]

## 2017-10-04 NOTE — Progress Notes (Signed)
Pt ambulating 431ft in hallway independently, peak HR 96. Returned with pt to room and completed discharge education. Pt, wife and sister educated on sternal precautions, showering, and seatbelt use. Encouraged pt to continue using IS after d/c. Pt given heart healthy and diabetic diets. Exercise guidelines reviewed. Will refer to CRP II [place].  [id number] Rufina Falco, RN BSN 10/04/2017 12:10 PM

## 2017-10-04 NOTE — Care Management Note (Signed)
Case Management Note Marvetta Gibbons RN, BSN Unit 4E-Case Manager [phone]  Patient Details  Name: Dan Lester MRN: [id number] Date of Birth: 1945-08-25  Subjective/Objective:   Pt admitted with STEMI/MVD- s/p CABGx5 on 09/30/17               Action/Plan: PTA pt lived at home with wife, independent, anticipate return home, CM to follow for transition of care needs  Expected Discharge Date:  10/04/17               Expected Discharge Plan:  Home/Self Care  In-House Referral:  NA  Discharge planning Services  CM Consult  Post Acute Care Choice:    Choice offered to:     DME Arranged:    DME Agency:     HH Arranged:    [place] Agency:     Status of Service:  Completed, signed off  If discussed at [employer] of Stay Meetings, dates discussed:    Discharge Disposition: home/self care   Additional Comments:  10/04/17- [place] RN, CM- pt for discharge home today- no transition of care needs noted.   Dawayne Patricia, RN 10/04/2017, 3:21 PM

## 2017-10-04 NOTE — Progress Notes (Signed)
I am covering Trish's inbox today.  Rec'd message to arrange f/u within 2 weeks. 2 week TOC visit scheduled with [facility] - 10/18/17 at 8:45am with Richardson Dopp, appt placed on AVS.  Melina Copa PA-C

## 2017-10-04 NOTE — Telephone Encounter (Signed)
The pt is being discharged from the hospital today. Will call him tomorrow. 

## 2017-10-04 NOTE — Telephone Encounter (Signed)
New message    TOC APPT  6/25 with Richardson Dopp

## 2017-10-05 ENCOUNTER — Other Ambulatory Visit: Payer: Self-pay | Admitting: Surgical

## 2017-10-05 NOTE — Telephone Encounter (Signed)
Patient contacted regarding discharge from [facility] on 10/04/17.  Patient understands to follow up with provider Richardson Dopp, PA-c on 10/18/17 at 8:45 at [facility] in [place]. Patient understands discharge instructions? Yes Patient understands medications and regiment? Yes Patient understands to bring all medications to this visit? Yes

## 2017-10-05 NOTE — Telephone Encounter (Signed)
1st Attempted TCM Call: I left a message asking the pt to call us back.

## 2017-10-17 ENCOUNTER — Telehealth (HOSPITAL_COMMUNITY): Payer: Self-pay

## 2017-10-17 NOTE — Telephone Encounter (Signed)
Patients insurance is active and benefits verified through [facility] - $50.00 co-pay, deductible amount of $3,000/$984.13 has been met, out of pocket amount of $7,900/$1,289.93 has been met, no co-insurance, and no pre-authorization is required. Passport/reference [id number]  Patients insurance is active and benefits verified through [place] - No co-pay, deductible amount of $3,000/$571.11 has been met, out of pocket amount of $6,000/$571.11 has been met, 20% co-insurance, and no pre-authorization is required. Passport/reference [phone]  Will contact patient to see if he is interested in the Cardiac Rehab Program. If interested, patient will need to complete follow up appt. Once completed, patient will be contacted for scheduling upon review by the RN navigator.

## 2017-10-17 NOTE — Telephone Encounter (Signed)
Called patient to see if he is interested in the Cardiac Rehab Program - Patient stated he is interested. Explained scheduling process and went over insurance, patient verbalized understanding. Will contact patient once follow up appt has been completed upon review by the RN navigator.

## 2017-10-18 ENCOUNTER — Encounter: Payer: Self-pay | Admitting: Physician Assistant

## 2017-10-18 ENCOUNTER — Ambulatory Visit (INDEPENDENT_AMBULATORY_CARE_PROVIDER_SITE_OTHER): Payer: 59 | Admitting: Physician Assistant

## 2017-10-18 VITALS — BP 124/82 | HR 56 | Ht 74.0 in | Wt 175.4 lb

## 2017-10-18 DIAGNOSIS — I4891 Unspecified atrial fibrillation: Secondary | ICD-10-CM

## 2017-10-18 DIAGNOSIS — I214 Non-ST elevation (NSTEMI) myocardial infarction: Secondary | ICD-10-CM | POA: Diagnosis not present

## 2017-10-18 DIAGNOSIS — I9789 Other postprocedural complications and disorders of the circulatory system, not elsewhere classified: Secondary | ICD-10-CM

## 2017-10-18 DIAGNOSIS — E785 Hyperlipidemia, unspecified: Secondary | ICD-10-CM | POA: Insufficient documentation

## 2017-10-18 DIAGNOSIS — E118 Type 2 diabetes mellitus with unspecified complications: Secondary | ICD-10-CM

## 2017-10-18 HISTORY — DX: Type 2 diabetes mellitus with unspecified complications: E11.8

## 2017-10-18 MED ORDER — AMIODARONE HCL 400 MG PO TABS: 200.0000 mg | ORAL_TABLET | Freq: Every day | ORAL | Status: DC

## 2017-10-18 NOTE — Progress Notes (Signed)
Cardiology Office Note:    Date:  10/18/2017   ID:  Dan Lester, DOB Feb 25, 1946, MRN [id number]  PCP:  Dan Bunting, MD  Cardiologist:  Dan Moores, MD   Referring MD: Dan Bunting, MD   Chief Complaint  Patient presents with  . Hospitalization Follow-up    s/p MI >> s/p CABG    History of Present Illness:    Dan Lester is a 72 y.o. male with coronary disease, diabetes, hyperlipidemia, prostate cancer.  He was admitted 6/4-6/11 with a non-ST elevation myocardial infarction.  Cardiac catheterization demonstrated severe three-vessel CAD and he was referred for CABG.  This was performed by Dr. Servando Lester and included LIMA-LAD, SVG-D1, SVG-OM/distal LCx and SVG-proximal PDA.  Postoperative course was notable for atrial fibrillation.  He converted to normal sinus rhythm with amiodarone.  An ACE inhibitor/angiotensin receptor blocker was not started secondary to low blood pressure.  Dan Lester returns for posthospitalization follow-up.  He is here alone.  Since discharge, he has been doing well.  His chest wound is still somewhat sore.  He has been having difficulty sleeping.  He has tried melatonin with mixed results.  He denies shortness of breath.  He has been walking every day, twice a day.  He denies PND, dizziness.  His appetite was initially poor but has improved.  He denies fevers.  Prior CV studies:   The following studies were reviewed today:  Pre-CABG dopplers 10/01/59 RICA 9-50; LICA 9-32 ABIs normal.  Cardiac Catheterization 09/27/17  Severe multivessel coronary artery disease.  Normal left main.  Proximal LAD beyond the first diagonal 95%.  Mid 50% stenosis.  The  first diagonal contains a subtotally occluded branch with TIMI grade II  flow and 80% stenosis of the larger branch.  The circumflex coronary artery is diffusely diseased with greater than  90% obstruction in the first marginal and 95% stenosis in the continuation  of the circumflex to the  second obtuse marginal.  High-grade segmental 90% stenosis in the mid right coronary.  PDA  contains 70% obstruction.  Overall LV function is lower normal with an estimated EF of 50%.  LVEDP  is 21 mmHg.  Findings compatible with chronic diastolic heart failure. RECOMMENDATIONS:  Given the diffuse nature of the disease in this diabetic, will recommend  evaluation by cardiac surgery to determine if he is a candidate for  surgical revascularization.  Echo 09/27/17 Mild focal basal septal hypertrophy, EF 55-60, inf poss HK, Gr 1 DD, MAC  Past Medical History:  Diagnosis Date  . Arthritis   . Asthma   . Cancer [facility])    prostate  . Coronary artery disease 09/30/2017   NSTEMI 6/19 >> 3V CAD >> s/p CABG (LIMA-LAD, SVG-D1, SVG-OM/distal LCx and SVG-proximal PDA)  . Depression   . Diabetes mellitus without complication (HCC)    , diet controlled  . GERD (gastroesophageal reflux disease)   . H/O colonoscopy 03/12/2009  . History of kidney stones   . Hyperlipidemia   . Hypothyroidism   . Inguinal hernia   . Neck pain   . Postoperative atrial flutter (HCC)    Post CABG >> converted on Amiodarone  . Thyroid disease    hyperthyroidism; surgery changed him to hypo   Surgical Hx: The patient  has a past surgical history that includes Sinus surgery with Instatrak; Prostate surgery (11/2004); Back surgery (09/2005); Hernia repair; Eye surgery; Cholecystectomy; Debridment of decubitus ulcer (Right, 08/29/2015); Mass excision (Right, 03/22/2017); excision of Ischial pressure ulcer (Right, 03/23/2017); LEFT  HEART CATH AND CORONARY ANGIOGRAPHY (N/A, 09/27/2017); Coronary artery bypass graft (N/A, 09/30/2017); and TEE without cardioversion (N/A, 09/30/2017).   Current Medications: Current Meds  Medication Sig  . aspirin EC 325 MG EC tablet Take 1 tablet (325 mg total) by mouth daily.  Marland Kitchen atorvastatin (LIPITOR) 80 MG tablet Take 1 tablet (80 mg total) by mouth daily at 6 PM.  . buPROPion (WELLBUTRIN XL) 150  MG 24 hr tablet Take 150 mg daily by mouth.  . fluticasone (FLONASE) 50 MCG/ACT nasal spray Place 2 sprays daily into both nostrils.  . Fluticasone-Salmeterol (ADVAIR) 250-50 MCG/DOSE AEPB Inhale 1 puff daily as needed into the lungs (shortness of breath).   . insulin glargine (LANTUS) 100 UNIT/ML injection Inject 10 Units into the skin every evening.   Marland Kitchen levothyroxine (SYNTHROID) 200 MCG tablet Take 200 mcg every evening by mouth.   . MELATONIN PO Take 1 tablet at bedtime as needed by mouth (sleep).  . metFORMIN (GLUCOPHAGE) 500 MG tablet Take 500 mg 2 (two) times daily with a meal by mouth.  . metoprolol tartrate (LOPRESSOR) 25 MG tablet Take 1 tablet (25 mg total) by mouth 2 (two) times daily.  . Multiple Vitamin (MULTIVITAMIN WITH MINERALS) TABS tablet Take 1 tablet daily by mouth.  . pantoprazole (PROTONIX) 40 MG tablet Take 40 mg daily by mouth.  . SUPER B COMPLEX/C CAPS Take 1 tablet daily by mouth.  . [DISCONTINUED] amiodarone (PACERONE) 400 MG tablet Take 1 tablet (400 mg total) by mouth every 12 (twelve) hours. For 7 days then 400 mg once daily     Allergies:   Patient has no known allergies.   Social History   Tobacco Use  . Smoking status: Former Smoker    Types: Cigars  . Smokeless tobacco: Never Used  Substance Use Topics  . Alcohol use: No  . Drug use: No     Family Hx: The patient's family history includes Cancer in his father; Heart disease in his father and mother.  ROS:   Please see the history of present illness.    ROS All other systems reviewed and are negative.   EKGs/Labs/Other Test Reviewed:    EKG:  EKG is  ordered today.  The ekg ordered today demonstrates sinus bradycardia, heart rate 56, normal axis, T wave inversions 2, 3, aVF, V3-V6, QTC 424, first-degree AV block with a PR interval 216 ms, PVC  Recent Labs: 09/27/2017: TSH 0.080 09/29/2017: ALT 20 10/01/2017: Magnesium 2.4 10/03/2017: BUN 22; Creatinine, Ser 1.03; Hemoglobin 9.2; Platelets 116;  Potassium 4.1; Sodium 137   From KPN Tool:  A1C 6.900 09/29/2017  Recent Lipid Panel No results found for: CHOL, TRIG, HDL, CHOLHDL, LDLCALC, LDLDIRECT  Physical Exam:    VS:  BP 124/82   Pulse (!) 56   Ht _0  (1.88 m)   Wt 175 lb 6.4 oz (79.6 kg)   SpO2 98%   BMI 22.52 kg/m     Wt Readings from Last 3 Encounters:  10/18/17 175 lb 6.4 oz (79.6 kg)  10/04/17 185 lb (83.9 kg)  03/22/17 179 lb 3 oz (81.3 kg)     Physical Exam  Constitutional: He is oriented to person, place, and time. He appears well-developed and well-nourished. No distress.  HENT:  Head: Normocephalic and atraumatic.  Neck: Neck supple.  Cardiovascular: Normal rate, regular rhythm, S1 normal and S2 normal.  No murmur heard. Pulmonary/Chest: Breath sounds normal. He has no rales.  Median sternotomy well healed without erythema or discharge  Abdominal: Soft. There is no hepatomegaly.  Musculoskeletal: He exhibits edema (1+ R ankle edema; incisions on R leg healing well).  Neurological: He is alert and oriented to person, place, and time.  Skin: Skin is warm and dry.    ASSESSMENT & PLAN:    NSTEMI (non-ST elevated myocardial infarction) ([place]) Status post recent myocardial infarction followed by CABG.  He is progressing well post bypass surgery.  He has been contacted by cardiac rehabilitation.  I have encouraged him to pursue this.  He sees Dr. Servando Lester in the next 2 weeks.  His blood pressure is still not high enough to start on an ACE inhibitor.    -Continue beta-blocker, statin  -Continue high-dose aspirin for a total of 3 months postop, then aspirin 81 mg  -Trial of Melatonin, Benadryl or Tylenol PM to help with sleep  -Start cardiac rehabilitation after seeing Dr. Servando Lester  Postoperative atrial flutter ([place]) Maintaining normal sinus rhythm on amiodarone.  -Decrease amiodarone to 200 mg daily in 1 week  -Plan to DC amiodarone at follow-up  Type 2 diabetes mellitus with complication, without  long-term current use of insulin (HCC) A1c in June 2019 was 6.9.  We discussed continued aggressive control of risk factors to reduce risk of recurrent heart attack.  Hyperlipidemia, unspecified hyperlipidemia type Continue high-dose statin.  Goal LDL < 70.  Arrange follow-up fasting lipids and LFTs.   Dispo:  Return in about 8 weeks (around 12/13/2017) for Routine Follow Up, w/ Dr. Acie Fredrickson, or Richardson Dopp, PA-C.   Medication Adjustments/Labs and Tests Ordered: Current medicines are reviewed at length with the patient today.  Concerns regarding medicines are outlined above.  Tests Ordered: Orders Placed This Encounter  Procedures  . Lipid Profile  . Hepatic function panel  . EKG 12-Lead   Medication Changes: Meds ordered this encounter  Medications  . amiodarone (PACERONE) 400 MG tablet    Sig: Take 0.5 tablets (200 mg total) by mouth daily.    DECREASE IN DOSE    Signed, Richardson Dopp, PA-C  10/18/2017 3:23 PM    [facility] [address], [place], [place]  [address] Phone: [phone]; Fax: [phone]

## 2017-10-18 NOTE — Patient Instructions (Addendum)
Medication Instructions:  1. IN 1 WEEK START DECREASING AMIODARONE TO 200 MG DAILY; THIS WILL BE 1/2 TABLET OF THE 400 MG TABLET  Labwork: FASTING LIPID AND LIVER PANEL TO BE DONE 12/28/17  Testing/Procedures: NONE ORDERED TODAY  Follow-Up: Richardson Dopp, [facility] ON 12/28/17 @ 8:15   Any Other Special Instructions Will Be Listed Below (If Applicable). PER [employer], PAC OK TO TRY MELATONIN, BENADRYL OR TYLENOL PM TO HELP SLEEP    If you need a refill on your cardiac medications before your next appointment, please call your pharmacy.

## 2017-10-20 ENCOUNTER — Ambulatory Visit: Payer: 59 | Admitting: Cardiothoracic Surgery

## 2017-10-21 ENCOUNTER — Other Ambulatory Visit: Payer: Self-pay | Admitting: Cardiothoracic Surgery

## 2017-10-21 DIAGNOSIS — I2510 Atherosclerotic heart disease of native coronary artery without angina pectoris: Secondary | ICD-10-CM

## 2017-10-24 ENCOUNTER — Other Ambulatory Visit: Payer: Self-pay

## 2017-10-24 ENCOUNTER — Encounter: Payer: Self-pay | Admitting: Physician Assistant

## 2017-10-24 ENCOUNTER — Ambulatory Visit
Admission: RE | Admit: 2017-10-24 | Discharge: 2017-10-24 | Disposition: A | Discharge status: Home / Self Care | Payer: 59 | Source: Ambulatory Visit | Attending: Cardiothoracic Surgery | Admitting: Cardiothoracic Surgery

## 2017-10-24 ENCOUNTER — Ambulatory Visit (INDEPENDENT_AMBULATORY_CARE_PROVIDER_SITE_OTHER): Payer: Self-pay | Admitting: Physician Assistant

## 2017-10-24 VITALS — BP 139/74 | HR 62 | Resp 16 | Ht 74.0 in | Wt 169.7 lb

## 2017-10-24 DIAGNOSIS — Z951 Presence of aortocoronary bypass graft: Secondary | ICD-10-CM

## 2017-10-24 DIAGNOSIS — I2510 Atherosclerotic heart disease of native coronary artery without angina pectoris: Secondary | ICD-10-CM

## 2017-10-24 MED ORDER — TRAMADOL HCL 50 MG PO TABS: 50.0000 mg | ORAL_TABLET | Freq: Two times a day (BID) | ORAL | 0 refills | Status: AC | PRN

## 2017-10-24 NOTE — Patient Instructions (Signed)
You are encouraged to enroll and participate in the outpatient cardiac rehab program beginning as soon as practical.  You may return to driving an automobile as long as you are no longer requiring oral narcotic pain relievers during the daytime.  It would be wise to start driving only short distances during the daylight and gradually increase from there as you feel comfortable.  Make every effort to stay physically active, get some type of exercise on a regular basis, and stick to a "heart healthy diet".  The long term benefits for regular exercise and a healthy diet are critically important to your overall health and wellbeing. 

## 2017-10-24 NOTE — Progress Notes (Signed)
[place] [address]       Lester,Dan Lester             [id number]      Dan Lester is a 72 y.o. male patient s/p CABG x 5 on 09/30/2017.  He is here today for his routine follow-up visit.  His intraoperative course was complicated by atrial fibrillation.  He was started on oral amiodarone upon discharge.    No diagnosis found. Past Medical History:  Diagnosis Date  . Arthritis   . Asthma   . Cancer [facility])    prostate  . Coronary artery disease 09/30/2017   NSTEMI 6/19 >> 3V CAD >> s/p CABG (LIMA-LAD, SVG-D1, SVG-OM/distal LCx and SVG-proximal PDA)  . Depression   . Diabetes mellitus without complication (HCC)    , diet controlled  . GERD (gastroesophageal reflux disease)   . H/O colonoscopy 03/12/2009  . History of kidney stones   . Hyperlipidemia   . Hypothyroidism   . Inguinal hernia   . Neck pain   . Postoperative atrial flutter (HCC)    Post CABG >> converted on Amiodarone  . Thyroid disease    hyperthyroidism; surgery changed him to hypo   No past surgical history pertinent negatives on file. Scheduled Meds: Current Outpatient Medications on File Prior to Visit  Medication Sig Dispense Refill  . amiodarone (PACERONE) 400 MG tablet Take 0.5 tablets (200 mg total) by mouth daily.    Marland Kitchen aspirin EC 325 MG EC tablet Take 1 tablet (325 mg total) by mouth daily.    Marland Kitchen atorvastatin (LIPITOR) 80 MG tablet Take 1 tablet (80 mg total) by mouth daily at 6 PM. 30 tablet 1  . buPROPion (WELLBUTRIN XL) 150 MG 24 hr tablet Take 150 mg daily by mouth.    . fluticasone (FLONASE) 50 MCG/ACT nasal spray Place 2 sprays daily into both nostrils.    . Fluticasone-Salmeterol (ADVAIR) 250-50 MCG/DOSE AEPB Inhale 1 puff daily as needed into the lungs (shortness of breath).     . insulin glargine (LANTUS) 100 UNIT/ML injection Inject 10 Units into the skin every evening.     Marland Kitchen levothyroxine (SYNTHROID) 200 MCG tablet Take 200 mcg every evening by mouth.     . MELATONIN PO  Take 1 tablet at bedtime as needed by mouth (sleep).    . metFORMIN (GLUCOPHAGE) 500 MG tablet Take 500 mg 2 (two) times daily with a meal by mouth.    . metoprolol tartrate (LOPRESSOR) 25 MG tablet Take 1 tablet (25 mg total) by mouth 2 (two) times daily. 60 tablet 1  . Multiple Vitamin (MULTIVITAMIN WITH MINERALS) TABS tablet Take 1 tablet daily by mouth.    . nabumetone (RELAFEN) 500 MG tablet Take 500 mg by mouth See admin instructions.  6  . pantoprazole (PROTONIX) 40 MG tablet Take 40 mg daily by mouth.    . SUPER B COMPLEX/C CAPS Take 1 tablet daily by mouth.     No current facility-administered medications on file prior to visit.      Blood pressure 139/74, pulse 62, resp. rate 16, height 6\' 2"  (1.88 m), weight 77 kg (169 lb 11.2 oz), SpO2 97 %.  Subjective overall patient is doing quite well status post coronary bypass grafting x5.  He is walking several times a day.  He does still have some pain to the right of his incision and in his right vein harvest leg.  He does also occasionally get fatigued and  short of breath from time to time based on his activity level.  He is having some difficulty sleeping and I suggested Benadryl, melatonin, or Tylenol PM as safe choices for sleep aids.  Objective  Cor: Regular rate and rhythm, no murmur Pulm: Clear to auscultation in all fields Abd: No tenderness Wound: Sternotomy incision is clean and dry.  EVH incision on the right leg is clean and dry without drainage.  There is a small palpable cord tracking up the right thigh and 2 small dime sized clots below the knee that appeared to be in the vein harvesting tunnel. Ext: Right lower leg edema.  No edema on the left side.      CLINICAL DATA:  History of prior CABG with pain at incision site, initial encounter  EXAM: CHEST - 2 VIEW  COMPARISON:  10/03/2017  FINDINGS: Postsurgical changes are again noted. Cardiac shadow is within normal limits. The lungs are well aerated bilaterally.  No sizable infiltrate or effusion is noted. Degenerative changes of the thoracic spine are noted. No other bony abnormality is seen.  IMPRESSION: Status post coronary bypass grafting without acute abnormality.   Electronically Signed   By: Dan Lester M.D.   On: 10/24/2017 15:56  Assessment & Plan  [name] is status post coronary bypass grafting x5 on 09/30/2016.  Overall he is doing quite well.  He does still have some incisional pain specifically on the right side of the incision.  He does still have some pain in his right leg which was his vein harvesting leg.  He has some edema in this leg as well with a small palpable cord and dime sized palpable clot that appears to be in the vein harvesting tunnel.  I have suggested warm compresses and compression for this leg and if anything gets worse to please call the office and we will evaluate him.  Overall he is been doing several walks a day and although he still gets short of breath from time to time he does feel like he is progressing.  He is interested in cardiac rehab and I have provided a referral today.  He states that they have called him to set up an initial evaluation.  He is to avoid pushing and pulling movements and anything heavier than 10 pounds with his upper body until rehab evaluates him.  I provided him with a tramadol prescription today for 20 tabs since he still is having some pain and tramadol seems to work better than the oxycodone.  He is cleared to drive as long as he is not taking tramadol during the day.  He also is encouraged to maintain a heart healthy diet.  He has interested in returning to work perhaps sometime in August and I suggested reaching out to our office so we may provide a return to work letter at that time.  He also expressed interest in handicap sticker which I would happily fill out the form for but he would need to provide it to our office.  He already saw his cardiologist and has another follow-up  appointment in September.  They decrease his amiodarone to 200 mg daily.  Today he is in normal sinus rhythm in the 60s.  We reviewed his chest x-ray together and I did not have any concerns.  He is to follow-up with our office on a as needed basis.  He will follow-up with his primary care and cardiologist.  He is welcome to call if he has any questions or concerns.  Elgie Collard 10/24/2017

## 2017-10-25 NOTE — Addendum Note (Signed)
Addended by: Charlena Cross F on: 10/25/2017 09:33 AM   Modules accepted: Orders

## 2017-10-28 ENCOUNTER — Telehealth (HOSPITAL_COMMUNITY): Payer: Self-pay

## 2017-10-28 NOTE — Telephone Encounter (Signed)
Attempted to call patient in regards to Cardiac Rehab - LM on VM 

## 2017-10-31 ENCOUNTER — Telehealth (HOSPITAL_COMMUNITY): Payer: Self-pay

## 2017-10-31 NOTE — Telephone Encounter (Signed)
2nd attempt to contact patient in regards to Cardiac Rehab - lm on vm. Sending letter. °

## 2017-10-31 NOTE — Telephone Encounter (Signed)
Called and spoke with patient in regards to Cardiac Rehab -  Patient stated he is interested but he will need to check with work to see if he can fit Rehab into his schedule. Patient is going out of town in two weeks for two weeks. Will follow up in August.

## 2017-11-19 ENCOUNTER — Other Ambulatory Visit: Payer: Self-pay | Admitting: Surgical

## 2017-11-29 ENCOUNTER — Telehealth (HOSPITAL_COMMUNITY): Payer: Self-pay

## 2017-11-29 NOTE — Telephone Encounter (Signed)
Patient called to schedule for CR, he will come in for orientation on 01/10/18 @ 8AM and will attend the 2:45PM exercise class.  Mailed homework package.

## 2017-12-27 NOTE — Progress Notes (Signed)
Cardiology Office Note:    Date:  12/28/2017   ID:  Dan Lester, DOB Sep 20, 1945, MRN [id number]  PCP:  Dan Bunting, MD  Cardiologist:  Dan Moores, MD   Referring MD: Dan Bunting, MD   Chief Complaint  Patient presents with  . Follow-up    CAD    History of Present Illness:    Dan Lester is a 72 y.o. male with coronary disease s/p MI in 09/2017 followed by CABG, diabetes, hyperlipidemia, prostate cancer.  His CABG was complicated by atrial fibrillation.  He was last seen in 09/2017   Dan Lester returns for follow-up.  He is here alone.  Since last seen, he has done well.  His chest still remains sore.  He also has some soreness in his right leg at the vein harvest site at times.  He denies significant shortness of breath, PND, leg swelling.  He denies syncope.  He sometimes gets lightheaded with standing abruptly.  Unfortunately, he did not receive refills on atorvastatin and metoprolol.  He has been out of both of these medications for about 1 month.  He is due to start cardiac rehabilitation soon.  Prior CV studies:   The following studies were reviewed today:  Pre-CABG dopplers 10/03/46 RICA 5-46; LICA 2-70 ABIs normal.  Cardiac Catheterization 09/27/17  Severe multivessel coronary artery disease.  Normal left main.  Proximal LAD beyond the first diagonal 95%.  Mid 50% stenosis.  The  first diagonal contains a subtotally occluded branch with TIMI grade II  flow and 80% stenosis of the larger branch.  The circumflex coronary artery is diffusely diseased with greater than  90% obstruction in the first marginal and 95% stenosis in the continuation  of the circumflex to the second obtuse marginal.  High-grade segmental 90% stenosis in the mid right coronary.  PDA  contains 70% obstruction.  Overall LV function is lower normal with an estimated EF of 50%.  LVEDP  is 21 mmHg.  Findings compatible with chronic diastolic heart failure. RECOMMENDATIONS:   Given the diffuse nature of the disease in this diabetic, will recommend  evaluation by cardiac surgery to determine if he is a candidate for  surgical revascularization.  Echo 09/27/17 Mild focal basal septal hypertrophy, EF 55-60, inf poss HK, Gr 1 DD, MAC  Past Medical History:  Diagnosis Date  . Abscess of right thigh 11/06/2013  . Arthritis   . Asthma   . Cancer [facility])    prostate  . Chronic cholecystitis with calculus 10/19/2013  . Coronary artery disease 09/30/2017   NSTEMI 6/19 >> 3V CAD >> s/p CABG (LIMA-LAD, SVG-D1, SVG-OM/distal LCx and SVG-proximal PDA)  . Depression   . Diabetes mellitus without complication (HCC)    , diet controlled  . GERD (gastroesophageal reflux disease)   . H/O colonoscopy 03/12/2009  . Hernia, inguinal, left 12/17/2011  . History of kidney stones   . History of pressure ulcer on buttock 03/22/2017  . Hyperlipidemia   . Hypothyroidism   . Inguinal hernia   . Neck pain   . NSTEMI (non-ST elevated myocardial infarction) ([place])   . Postoperative atrial flutter (HCC)    Post CABG >> converted on Amiodarone  . Thyroid disease    hyperthyroidism; surgery changed him to hypo  . Type 2 diabetes mellitus with complication, without long-term current use of insulin ([place]) 10/18/2017   Surgical Hx: The patient  has a past surgical history that includes Sinus surgery with Instatrak; Prostate surgery (11/2004); Back surgery (09/2005); Hernia  repair; Eye surgery; Cholecystectomy; Debridment of decubitus ulcer (Right, 08/29/2015); Mass excision (Right, 03/22/2017); excision of Ischial pressure ulcer (Right, 03/23/2017); LEFT HEART CATH AND CORONARY ANGIOGRAPHY (N/A, 09/27/2017); Coronary artery bypass graft (N/A, 09/30/2017); and TEE without cardioversion (N/A, 09/30/2017).   Current Medications: Current Meds  Medication Sig  . atorvastatin (LIPITOR) 80 MG tablet Take 1 tablet (80 mg total) by mouth daily at 6 PM.  . buPROPion (WELLBUTRIN XL) 150 MG 24 hr tablet Take 150 mg  daily by mouth.  . fluticasone (FLONASE) 50 MCG/ACT nasal spray Place 2 sprays daily into both nostrils.  . Fluticasone-Salmeterol (ADVAIR) 250-50 MCG/DOSE AEPB Inhale 1 puff daily as needed into the lungs (shortness of breath).   . insulin glargine (LANTUS) 100 UNIT/ML injection Inject 10 Units into the skin every evening.   Marland Kitchen levothyroxine (SYNTHROID) 200 MCG tablet Take 200 mcg every evening by mouth.   . MELATONIN PO Take 1 tablet at bedtime as needed by mouth (sleep).  . metFORMIN (GLUCOPHAGE) 500 MG tablet Take 500 mg 2 (two) times daily with a meal by mouth.  . metoprolol tartrate (LOPRESSOR) 25 MG tablet Take 1 tablet (25 mg total) by mouth 2 (two) times daily.  . Multiple Vitamin (MULTIVITAMIN WITH MINERALS) TABS tablet Take 1 tablet daily by mouth.  . nabumetone (RELAFEN) 500 MG tablet Take 500 mg by mouth See admin instructions.  . pantoprazole (PROTONIX) 40 MG tablet Take 40 mg daily by mouth.  . SUPER B COMPLEX/C CAPS Take 1 tablet daily by mouth.  . traMADol (ULTRAM) 50 MG tablet Take 1 tablet (50 mg total) by mouth every 12 (twelve) hours as needed.  . [DISCONTINUED] amiodarone (PACERONE) 400 MG tablet Take 0.5 tablets (200 mg total) by mouth daily.  . [DISCONTINUED] aspirin EC 325 MG EC tablet Take 1 tablet (325 mg total) by mouth daily.  . [DISCONTINUED] atorvastatin (LIPITOR) 80 MG tablet Take 1 tablet (80 mg total) by mouth daily at 6 PM.  . [DISCONTINUED] metoprolol tartrate (LOPRESSOR) 25 MG tablet Take 1 tablet (25 mg total) by mouth 2 (two) times daily.     Allergies:   Patient has no known allergies.   Social History   Tobacco Use  . Smoking status: Former Smoker    Types: Cigars  . Smokeless tobacco: Never Used  Substance Use Topics  . Alcohol use: No  . Drug use: No     Family Hx: The patient's family history includes Cancer in his father; Heart disease in his father and mother.  ROS:   Please see the history of present illness.    ROS All other systems  reviewed and are negative.   EKGs/Labs/Other Test Reviewed:    EKG:  EKG is   ordered today.  The ekg ordered today demonstrates normal sinus rhythm, heart rate 82, normal axis, first-degree AV block, septal Q waves, QTC 422  Recent Labs: 09/27/2017: TSH 0.080 09/29/2017: ALT 20 10/01/2017: Magnesium 2.4 10/03/2017: BUN 22; Creatinine, Ser 1.03; Hemoglobin 9.2; Platelets 116; Potassium 4.1; Sodium 137   Recent Lipid Panel No results found for: CHOL, TRIG, HDL, CHOLHDL, LDLCALC, LDLDIRECT  Physical Exam:    VS:  BP (!) 142/80   Pulse 82   Ht _0  (1.88 m)   Wt 170 lb (77.1 kg)   BMI 21.83 kg/m     Wt Readings from Last 3 Encounters:  12/28/17 170 lb (77.1 kg)  10/24/17 169 lb 11.2 oz (77 kg)  10/18/17 175 lb 6.4 oz (79.6 kg)  Physical Exam  Constitutional: He is oriented to person, place, and time. He appears well-developed and well-nourished. No distress.  HENT:  Head: Normocephalic and atraumatic.  Eyes: No scleral icterus.  Neck: No JVD present. No thyromegaly present.  Cardiovascular: Normal rate and regular rhythm.  No murmur heard. Pulmonary/Chest: Effort normal. He has no wheezes. He has no rales.  Abdominal: Soft. He exhibits no distension.  Musculoskeletal: He exhibits no edema.  Lymphadenopathy:    He has no cervical adenopathy.  Neurological: He is alert and oriented to person, place, and time.  Skin: Skin is warm and dry.  Psychiatric: He has a normal mood and affect.    ASSESSMENT & PLAN:    Coronary artery disease involving native coronary artery of native heart without angina pectoris History of myocardial infarction June 2019 followed by CABG. He is progressing well.  He is back at work.  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  He thinks he may retire next year.  His blood pressure is higher today.  I did consider placing him on an ACE inhibitor.  However, he has been out of metoprolol for about a month.  We will need to resume his  beta-blocker first.  If his blood pressures been tolerated, we can certainly start ACE inhibitor in the future.  As noted, he starts cardiac rehabilitation soon.  -Resume metoprolol 25 mg twice daily  -Resume atorvastatin 80 mg daily  -Continue aspirin; he may reduce this to 81 mg daily  Postoperative atrial flutter (HCC) He remains in normal sinus rhythm.  It is been 90 days since his bypass surgery.  He can stop his amiodarone.  Hyperlipidemia, unspecified hyperlipidemia type Resume atorvastatin as noted.  Arrange lipids and LFTs in 3 months.   Dispo:  Return in about 6 months (around 06/28/2018) for Routine Follow Up, w/ Dr. Acie Fredrickson, or Richardson Dopp, PA-C.   Medication Adjustments/Labs and Tests Ordered: Current medicines are reviewed at length with the patient today.  Concerns regarding medicines are outlined above.  Tests Ordered: Orders Placed This Encounter  Procedures  . Lipid Profile  . Hepatic function panel  . EKG 12-Lead   Medication Changes: Meds ordered this encounter  Medications  . aspirin EC 81 MG tablet    Sig: Take 1 tablet (81 mg total) by mouth daily.  Marland Kitchen atorvastatin (LIPITOR) 80 MG tablet    Sig: Take 1 tablet (80 mg total) by mouth daily at 6 PM.    Dispense:  90 tablet    Refill:  3  . metoprolol tartrate (LOPRESSOR) 25 MG tablet    Sig: Take 1 tablet (25 mg total) by mouth 2 (two) times daily.    Dispense:  180 tablet    Refill:  3    Signed, Richardson Dopp, PA-C  12/28/2017 5:19 PM    [facility] [place], [place], [place]  [address] Phone: [phone]; Fax: [phone]

## 2017-12-28 ENCOUNTER — Ambulatory Visit (INDEPENDENT_AMBULATORY_CARE_PROVIDER_SITE_OTHER): Payer: 59 | Admitting: Physician Assistant

## 2017-12-28 ENCOUNTER — Encounter: Payer: Self-pay | Admitting: Physician Assistant

## 2017-12-28 VITALS — BP 142/80 | HR 82 | Ht 74.0 in | Wt 170.0 lb

## 2017-12-28 DIAGNOSIS — I4891 Unspecified atrial fibrillation: Secondary | ICD-10-CM | POA: Diagnosis not present

## 2017-12-28 DIAGNOSIS — E785 Hyperlipidemia, unspecified: Secondary | ICD-10-CM

## 2017-12-28 DIAGNOSIS — I9789 Other postprocedural complications and disorders of the circulatory system, not elsewhere classified: Secondary | ICD-10-CM | POA: Diagnosis not present

## 2017-12-28 DIAGNOSIS — I2510 Atherosclerotic heart disease of native coronary artery without angina pectoris: Secondary | ICD-10-CM | POA: Diagnosis not present

## 2017-12-28 MED ORDER — METOPROLOL TARTRATE 25 MG PO TABS: 25.0000 mg | ORAL_TABLET | Freq: Two times a day (BID) | ORAL | 3 refills | Status: DC

## 2017-12-28 MED ORDER — ASPIRIN EC 81 MG PO TBEC: 81.0000 mg | DELAYED_RELEASE_TABLET | Freq: Every day | ORAL | Status: AC

## 2017-12-28 MED ORDER — ATORVASTATIN CALCIUM 80 MG PO TABS: 80.0000 mg | ORAL_TABLET | Freq: Every day | ORAL | 3 refills | Status: DC

## 2017-12-28 NOTE — Patient Instructions (Signed)
Medication Instructions:  1. DECREASE ASPIRIN TO 81 MG DAILY  2. STOP AMIODARONE   Labwork: FASTING LAB WORK TO BE DONE IN 3 MONTHS; NOTHING TO EAT AFTER MIDNIGHT THE NIGHT BEFORE . MORNING MEDS WITH WATER IS FINE   Testing/Procedures: NONE ORDERED TODAY  Follow-Up: 6 MONTHS WITH DR. Acie Fredrickson  Any Other Special Instructions Will Be Listed Below (If Applicable).     If you need a refill on your cardiac medications before your next appointment, please call your pharmacy.

## 2018-01-04 DIAGNOSIS — E7849 Other hyperlipidemia: Secondary | ICD-10-CM | POA: Diagnosis not present

## 2018-01-04 DIAGNOSIS — R030 Elevated blood-pressure reading, without diagnosis of hypertension: Secondary | ICD-10-CM | POA: Diagnosis not present

## 2018-01-04 DIAGNOSIS — E119 Type 2 diabetes mellitus without complications: Secondary | ICD-10-CM | POA: Diagnosis not present

## 2018-01-05 ENCOUNTER — Telehealth (HOSPITAL_COMMUNITY): Payer: Self-pay | Admitting: Pharmacist

## 2018-01-05 NOTE — Telephone Encounter (Signed)
Cardiac Rehab Medication Review by a Pharmacist  Does the patient  feel that his/her medications are working for him/her?  yes  Has the patient been experiencing any side effects to the medications prescribed?  no  Does the patient measure his/her own blood pressure or blood glucose at home?  yes  Blood Glucose daily - FBG 140 9/12  Does the patient have any problems obtaining medications due to transportation or finances?   no  Understanding of regimen: good Understanding of indications: good Potential of compliance: good    Pharmacist comments: Patient seems knowledgeable of medications. He did mention that he was recently taken off of his lantus injection, but he still checks his blood glucose changes. Patient denies any issues with medications at this time.  Britt Boozer, PharmD PGY1 Pharmacy Resident 01/05/2018 12:30 PM

## 2018-01-06 NOTE — Progress Notes (Signed)
Dan Lester 72 y.o. male DOB 1946/01/18 MRN [id number]       Nutrition  No diagnosis found. Past Medical History:  Diagnosis Date  . Abscess of right thigh 11/06/2013  . Arthritis   . Asthma   . Cancer [facility])    prostate  . Chronic cholecystitis with calculus 10/19/2013  . Coronary artery disease 09/30/2017   NSTEMI 6/19 >> 3V CAD >> s/p CABG (LIMA-LAD, SVG-D1, SVG-OM/distal LCx and SVG-proximal PDA)  . Depression   . Diabetes mellitus without complication (HCC)    , diet controlled  . GERD (gastroesophageal reflux disease)   . H/O colonoscopy 03/12/2009  . Hernia, inguinal, left 12/17/2011  . History of kidney stones   . History of pressure ulcer on buttock 03/22/2017  . Hyperlipidemia   . Hypothyroidism   . Inguinal hernia   . Neck pain   . NSTEMI (non-ST elevated myocardial infarction) ([place])   . Postoperative atrial flutter (HCC)    Post CABG >> converted on Amiodarone  . Thyroid disease    hyperthyroidism; surgery changed him to hypo  . Type 2 diabetes mellitus with complication, without long-term current use of insulin ([place]) 10/18/2017   Meds reviewed.    Current Outpatient Medications (Endocrine & Metabolic):  .  insulin glargine (LANTUS) 100 UNIT/ML injection, Inject 10 Units into the skin every evening.  Marland Kitchen  levothyroxine (SYNTHROID) 200 MCG tablet, Take 200 mcg every evening by mouth.  .  metFORMIN (GLUCOPHAGE) 500 MG tablet, Take 500 mg 2 (two) times daily with a meal by mouth.  Current Outpatient Medications (Cardiovascular):  .  atorvastatin (LIPITOR) 80 MG tablet, Take 1 tablet (80 mg total) by mouth daily at 6 PM. .  metoprolol tartrate (LOPRESSOR) 25 MG tablet, Take 1 tablet (25 mg total) by mouth 2 (two) times daily.  Current Outpatient Medications (Respiratory):  .  fluticasone (FLONASE) 50 MCG/ACT nasal spray, Place 2 sprays daily into both nostrils. .  Fluticasone-Salmeterol (ADVAIR) 250-50 MCG/DOSE AEPB, Inhale 1 puff daily as needed into the lungs  (shortness of breath).   Current Outpatient Medications (Analgesics):  .  aspirin EC 81 MG tablet, Take 1 tablet (81 mg total) by mouth daily. .  nabumetone (RELAFEN) 500 MG tablet, Take 500 mg by mouth See admin instructions. .  traMADol (ULTRAM) 50 MG tablet, Take 1 tablet (50 mg total) by mouth every 12 (twelve) hours as needed. (Patient not taking: Reported on 01/05/2018)   Current Outpatient Medications (Other):  Marland Kitchen  buPROPion (WELLBUTRIN XL) 150 MG 24 hr tablet, Take 150 mg daily by mouth. .  MELATONIN PO, Take 1 tablet at bedtime as needed by mouth (sleep). .  Multiple Vitamin (MULTIVITAMIN WITH MINERALS) TABS tablet, Take 1 tablet daily by mouth. .  pantoprazole (PROTONIX) 40 MG tablet, Take 40 mg daily by mouth. .  SUPER B COMPLEX/C CAPS, Take 1 tablet daily by mouth.   HT: Ht Readings from Last 1 Encounters:  12/28/17 6\' 2"  (1.88 m)    WT: Wt Readings from Last 5 Encounters:  12/28/17 170 lb (77.1 kg)  10/24/17 169 lb 11.2 oz (77 kg)  10/18/17 175 lb 6.4 oz (79.6 kg)  10/04/17 185 lb (83.9 kg)  03/22/17 179 lb 3 oz (81.3 kg)     There is no height or weight on file to calculate BMI.   Current tobacco use? No       Labs:  Lipid Panel  No results found for: CHOL, TRIG, HDL, CHOLHDL, VLDL, LDLCALC, LDLDIRECT  Lab  Results  Component Value Date   HGBA1C 6.9 (H) 09/29/2017   CBG (last 3)  No results for input(s): GLUCAP in the last 72 hours.  Nutrition Diagnosis ? Food-and nutrition-related knowledge deficit related to lack of exposure to information as related to diagnosis of: ? CVD ? DM   Nutrition Goal(s):  ? To be determined   Plan:  Pt to attend nutrition classes ? Nutrition I ? Nutrition II ? Portion Distortion  ? Diabetes Blitz ? Diabetes Q & A Will provide client-centered nutrition education as part of interdisciplinary care.   Monitor and evaluate progress toward nutrition goal with team.  Laurina Bustle, MS, RD, LDN 01/06/2018 12:01 PM

## 2018-01-10 ENCOUNTER — Encounter (HOSPITAL_COMMUNITY)
Admission: RE | Admit: 2018-01-10 | Discharge: 2018-01-10 | Disposition: A | Discharge status: Home / Self Care | Payer: 59 | Source: Ambulatory Visit | Attending: Cardiovascular Disease | Admitting: Cardiovascular Disease

## 2018-01-10 VITALS — BP 124/80 | HR 66 | Ht 71.0 in | Wt 169.8 lb

## 2018-01-10 DIAGNOSIS — Z7982 Long term (current) use of aspirin: Secondary | ICD-10-CM | POA: Diagnosis not present

## 2018-01-10 DIAGNOSIS — K219 Gastro-esophageal reflux disease without esophagitis: Secondary | ICD-10-CM | POA: Insufficient documentation

## 2018-01-10 DIAGNOSIS — Z951 Presence of aortocoronary bypass graft: Secondary | ICD-10-CM | POA: Insufficient documentation

## 2018-01-10 DIAGNOSIS — Z87891 Personal history of nicotine dependence: Secondary | ICD-10-CM | POA: Diagnosis not present

## 2018-01-10 DIAGNOSIS — E119 Type 2 diabetes mellitus without complications: Secondary | ICD-10-CM | POA: Diagnosis not present

## 2018-01-10 DIAGNOSIS — Z794 Long term (current) use of insulin: Secondary | ICD-10-CM | POA: Diagnosis not present

## 2018-01-10 DIAGNOSIS — F329 Major depressive disorder, single episode, unspecified: Secondary | ICD-10-CM | POA: Diagnosis not present

## 2018-01-10 DIAGNOSIS — Z79890 Hormone replacement therapy (postmenopausal): Secondary | ICD-10-CM | POA: Insufficient documentation

## 2018-01-10 DIAGNOSIS — E785 Hyperlipidemia, unspecified: Secondary | ICD-10-CM | POA: Diagnosis not present

## 2018-01-10 DIAGNOSIS — E039 Hypothyroidism, unspecified: Secondary | ICD-10-CM | POA: Diagnosis not present

## 2018-01-10 DIAGNOSIS — Z79899 Other long term (current) drug therapy: Secondary | ICD-10-CM | POA: Diagnosis not present

## 2018-01-10 DIAGNOSIS — I214 Non-ST elevation (NSTEMI) myocardial infarction: Secondary | ICD-10-CM | POA: Diagnosis not present

## 2018-01-10 DIAGNOSIS — I2510 Atherosclerotic heart disease of native coronary artery without angina pectoris: Secondary | ICD-10-CM | POA: Diagnosis not present

## 2018-01-10 NOTE — Progress Notes (Signed)
Cardiac Individual Treatment Plan  Patient Details  Name: Dan Lester MRN: [id number] Date of Birth: [birth year] Referring Provider:   Flowsheet Row CARDIAC REHAB PHASE II ORIENTATION from 01/10/2018 in [place]  Referring Provider  Nahser, Arnette Norris, MD      Initial Encounter Date:  Flowsheet Row CARDIAC REHAB PHASE II ORIENTATION from 01/10/2018 in [place]  Date  01/10/18      Visit Diagnosis: 09/30/2017 S/P CABG (coronary artery bypass graft)  09/27/2017 NSTEMI (non-ST elevated myocardial infarction) [facility])  Patient's Home Medications on Admission:  Current Outpatient Medications:  .  aspirin EC 81 MG tablet, Take 1 tablet (81 mg total) by mouth daily., Disp: , Rfl:  .  atorvastatin (LIPITOR) 80 MG tablet, Take 1 tablet (80 mg total) by mouth daily at 6 PM., Disp: 90 tablet, Rfl: 3 .  buPROPion (WELLBUTRIN XL) 150 MG 24 hr tablet, Take 150 mg daily by mouth., Disp: , Rfl:  .  fluticasone (FLONASE) 50 MCG/ACT nasal spray, Place 2 sprays daily into both nostrils., Disp: , Rfl:  .  Fluticasone-Salmeterol (ADVAIR) 250-50 MCG/DOSE AEPB, Inhale 1 puff daily as needed into the lungs (shortness of breath). , Disp: , Rfl:  .  insulin glargine (LANTUS) 100 UNIT/ML injection, Inject 10 Units into the skin every evening. , Disp: , Rfl:  .  levothyroxine (SYNTHROID) 200 MCG tablet, Take 200 mcg every evening by mouth. , Disp: , Rfl:  .  MELATONIN PO, Take 1 tablet at bedtime as needed by mouth (sleep)., Disp: , Rfl:  .  metFORMIN (GLUCOPHAGE) 500 MG tablet, Take 500 mg 2 (two) times daily with a meal by mouth., Disp: , Rfl:  .  metoprolol tartrate (LOPRESSOR) 25 MG tablet, Take 1 tablet (25 mg total) by mouth 2 (two) times daily., Disp: 180 tablet, Rfl: 3 .  Multiple Vitamin (MULTIVITAMIN WITH MINERALS) TABS tablet, Take 1 tablet daily by mouth., Disp: , Rfl:  .  nabumetone (RELAFEN) 500 MG tablet, Take 500 mg by mouth See  admin instructions., Disp: , Rfl: 6 .  pantoprazole (PROTONIX) 40 MG tablet, Take 40 mg daily by mouth., Disp: , Rfl:  .  SUPER B COMPLEX/C CAPS, Take 1 tablet daily by mouth., Disp: , Rfl:  .  traMADol (ULTRAM) 50 MG tablet, Take 1 tablet (50 mg total) by mouth every 12 (twelve) hours as needed. (Patient not taking: Reported on 01/05/2018), Disp: 20 tablet, Rfl: 0  Past Medical History: Past Medical History:  Diagnosis Date  . Abscess of right thigh 11/06/2013  . Arthritis   . Asthma   . Cancer [facility])    prostate  . Chronic cholecystitis with calculus 10/19/2013  . Coronary artery disease 09/30/2017   NSTEMI 6/19 >> 3V CAD >> s/p CABG (LIMA-LAD, SVG-D1, SVG-OM/distal LCx and SVG-proximal PDA)  . Depression   . Diabetes mellitus without complication (HCC)    , diet controlled  . GERD (gastroesophageal reflux disease)   . H/O colonoscopy 03/12/2009  . Hernia, inguinal, left 12/17/2011  . History of kidney stones   . History of pressure ulcer on buttock 03/22/2017  . Hyperlipidemia   . Hypothyroidism   . Inguinal hernia   . Neck pain   . NSTEMI (non-ST elevated myocardial infarction) ([place])   . Postoperative atrial flutter (HCC)    Post CABG >> converted on Amiodarone  . Thyroid disease    hyperthyroidism; surgery changed him to hypo  . Type 2 diabetes mellitus with complication,  without long-term current use of insulin ([place]) 10/18/2017    Tobacco Use: Social History   Tobacco Use  Smoking Status Former Smoker  . Types: Cigars  Smokeless Tobacco Never Used    Labs: Recent Review Flowsheet Data    Labs for ITP Cardiac and Pulmonary Rehab Latest Ref Rng & Units 09/30/2017 09/30/2017 10/01/2017 10/01/2017 10/01/2017   Hemoglobin A1c 4.8 - 5.6 % - - - - -   PHART 7.350 - 7.450 7.325(L) - 7.358 7.360 -   PCO2ART 32.0 - 48.0 mmHg 41.0 - 42.4 38.3 -   HCO3 20.0 - 28.0 mmol/L 21.4 - 23.8 21.7 -   TCO2 22 - 32 mmol/L 23 22 25 23  21(L)   ACIDBASEDEF 0.0 - 2.0 mmol/L 4.0(H) - 2.0 3.0(H) -    O2SAT % 94.0 - 96.0 97.0 -      Capillary Blood Glucose: Lab Results  Component Value Date   GLUCAP 159 (H) 10/04/2017   GLUCAP 124 (H) 10/04/2017   GLUCAP 171 (H) 10/03/2017   GLUCAP 91 10/03/2017   GLUCAP 254 (H) 10/03/2017     Exercise Target Goals: Exercise Program Goal: Individual exercise prescription set using results from initial 6 min walk test and THRR while considering  patient's activity barriers and safety.   Exercise Prescription Goal: Initial exercise prescription builds to 30-45 minutes a day of aerobic activity, 2-3 days per week.  Home exercise guidelines will be given to patient during program as part of exercise prescription that the participant will acknowledge.  Activity Barriers & Risk Stratification: Activity Barriers & Cardiac Risk Stratification - 01/10/18 1018    Activity Barriers & Cardiac Risk Stratification          Activity Barriers  None    Cardiac Risk Stratification  High           6 Minute Walk: 6 Minute Walk    6 Minute Walk    Row Name 01/10/18 0841   Phase  Initial   Distance  1523 feet   Walk Time  6 minutes   # of Rest Breaks  0   MPH  2.88   METS  3.29   RPE  12   VO2 Peak  11.51   Symptoms  Yes (comment)   Comments  Patient c/o fatigue at end of walk test.   Resting HR  66 bpm   Resting BP  124/80   Resting Oxygen Saturation   98 %   Exercise Oxygen Saturation  during 6 min walk  97 %   Max Ex. HR  75 bpm   Max Ex. BP  118/82   2 Minute Post BP  124/78          Oxygen Initial Assessment:   Oxygen Re-Evaluation:   Oxygen Discharge (Final Oxygen Re-Evaluation):   Initial Exercise Prescription: Initial Exercise Prescription - 01/10/18 1000    Date of Initial Exercise RX and Referring Provider          Date  01/10/18    Referring Provider  Nahser, Arnette Norris, MD        Bike          Level  0.9    Minutes  10    METs  3.19        NuStep          Level  3    SPM  85    Minutes  10    METs  3  Track          Laps  12    Minutes  10    METs  3.09        Prescription Details          Frequency (times per week)  3    Duration  Progress to 30 minutes of continuous aerobic without signs/symptoms of physical distress        Intensity          THRR 40-80% of Max Heartrate  60-120    Ratings of Perceived Exertion  11-13    Perceived Dyspnea  0-4        Progression          Progression  Continue to progress workloads to maintain intensity without signs/symptoms of physical distress.        Resistance Training          Training Prescription  Yes    Weight  3lbs    Reps  10-15           Perform Capillary Blood Glucose checks as needed.  Exercise Prescription Changes:   Exercise Comments:   Exercise Goals and Review: Exercise Goals    Exercise Goals    Row Name 01/10/18 0900   Increase Physical Activity  Yes   Intervention  Provide advice, education, support and counseling about physical activity/exercise needs.;Develop an individualized exercise prescription for aerobic and resistive training based on initial evaluation findings, risk stratification, comorbidities and participant's personal goals.   Expected Outcomes  Short Term: Attend rehab on a regular basis to increase amount of physical activity.;Long Term: Exercising regularly at least 3-5 days a week.;Long Term: Add in home exercise to make exercise part of routine and to increase amount of physical activity.   Increase Strength and Stamina  Yes   Intervention  Develop an individualized exercise prescription for aerobic and resistive training based on initial evaluation findings, risk stratification, comorbidities and participant's personal goals.;Provide advice, education, support and counseling about physical activity/exercise needs.   Expected Outcomes  Short Term: Increase workloads from initial exercise prescription for resistance, speed, and METs.;Short Term: Perform resistance training exercises  routinely during rehab and add in resistance training at home;Long Term: Improve cardiorespiratory fitness, muscular endurance and strength as measured by increased METs and functional capacity (6MWT)   Able to understand and use rate of perceived exertion (RPE) scale  Yes   Intervention  Provide education and explanation on how to use RPE scale   Expected Outcomes  Short Term: Able to use RPE daily in rehab to express subjective intensity level;Long Term:  Able to use RPE to guide intensity level when exercising independently   Knowledge and understanding of Target Heart Rate Range (THRR)  Yes   Intervention  Provide education and explanation of THRR including how the numbers were predicted and where they are located for reference   Expected Outcomes  Short Term: Able to state/look up THRR;Long Term: Able to use THRR to govern intensity when exercising independently;Short Term: Able to use daily as guideline for intensity in rehab   Able to check pulse independently  Yes   Intervention  Provide education and demonstration on how to check pulse in carotid and radial arteries.;Review the importance of being able to check your own pulse for safety during independent exercise   Expected Outcomes  Short Term: Able to explain why pulse checking is important during independent exercise;Long Term: Able to check pulse independently and accurately   Understanding  of Exercise Prescription  Yes   Intervention  Provide education, explanation, and written materials on patient's individual exercise prescription   Expected Outcomes  Short Term: Able to explain program exercise prescription;Long Term: Able to explain home exercise prescription to exercise independently          Exercise Goals Re-Evaluation :   Discharge Exercise Prescription (Final Exercise Prescription Changes):   Nutrition:  Target Goals: Understanding of nutrition guidelines, daily intake of sodium 1500mg , cholesterol 200mg , calories  30% from fat and 7% or less from saturated fats, daily to have 5 or more servings of fruits and vegetables.  Biometrics: Pre Biometrics - 01/10/18 0745    Pre Biometrics          Height  5\' 11"  (1.803 m)    Weight  77 kg    Waist Circumference  37 inches    Hip Circumference  39.5 inches    Waist to Hip Ratio  0.94 %    BMI (Calculated)  23.69    Triceps Skinfold  7 mm    % Body Fat  21.5 %    Grip Strength  37 kg    Flexibility  9.5 in    Single Leg Stand  15.03 seconds            Nutrition Therapy Plan and Nutrition Goals:   Nutrition Assessments:   Nutrition Goals Re-Evaluation:   Nutrition Goals Re-Evaluation:   Nutrition Goals Discharge (Final Nutrition Goals Re-Evaluation):   Psychosocial: Target Goals: Acknowledge presence or absence of significant depression and/or stress, maximize coping skills, provide positive support system. Participant is able to verbalize types and ability to use techniques and skills needed for reducing stress and depression.  Initial Review & Psychosocial Screening: Initial Psych Review & Screening - 01/10/18 1159    Initial Review          Current issues with  None Identified        Family Dynamics          Good Support System?  Yes   family and friends        Barriers          Psychosocial barriers to participate in program  There are no identifiable barriers or psychosocial needs.        Screening Interventions          Interventions  Encouraged to exercise           Quality of Life Scores: Quality of Life - 01/10/18 1224    Quality of Life          Select  Quality of Life        Quality of Life Scores          Health/Function Pre  27.2 %    Socioeconomic Pre  30 %    Psych/Spiritual Pre  30 %    Family Pre  30 %    GLOBAL Pre  28.8 %          Scores of 19 and below usually indicate a poorer quality of life in these areas.  A difference of  2-3 points is a clinically meaningful difference.  A  difference of 2-3 points in the total score of the Quality of Life Index has been associated with significant improvement in overall quality of life, self-image, physical symptoms, and general health in studies assessing change in quality of life.  PHQ-9: Recent Review Flowsheet Data    There is no flowsheet data to  display.     Interpretation of Total Score  Total Score Depression Severity:  1-4 = Minimal depression, 5-9 = Mild depression, 10-14 = Moderate depression, 15-19 = Moderately severe depression, 20-27 = Severe depression   Psychosocial Evaluation and Intervention:   Psychosocial Re-Evaluation:   Psychosocial Discharge (Final Psychosocial Re-Evaluation):   Vocational Rehabilitation: Provide vocational rehab assistance to qualifying candidates.   Vocational Rehab Evaluation & Intervention:   Education: Education Goals: Education classes will be provided on a weekly basis, covering required topics. Participant will state understanding/return demonstration of topics presented.  Learning Barriers/Preferences:   Education Topics: Count Your Pulse:  -Group instruction provided by verbal instruction, demonstration, patient participation and written materials to support subject.  Instructors address importance of being able to find your pulse and how to count your pulse when at home without a heart monitor.  Patients get hands on experience counting their pulse with staff help and individually.   Heart Attack, Angina, and Risk Factor Modification:  -Group instruction provided by verbal instruction, video, and written materials to support subject.  Instructors address signs and symptoms of angina and heart attacks.    Also discuss risk factors for heart disease and how to make changes to improve heart health risk factors.   Functional Fitness:  -Group instruction provided by verbal instruction, demonstration, patient participation, and written materials to support subject.   Instructors address safety measures for doing things around the house.  Discuss how to get up and down off the floor, how to pick things up properly, how to safely get out of a chair without assistance, and balance training.   Meditation and Mindfulness:  -Group instruction provided by verbal instruction, patient participation, and written materials to support subject.  Instructor addresses importance of mindfulness and meditation practice to help reduce stress and improve awareness.  Instructor also leads participants through a meditation exercise.    Stretching for Flexibility and Mobility:  -Group instruction provided by verbal instruction, patient participation, and written materials to support subject.  Instructors lead participants through series of stretches that are designed to increase flexibility thus improving mobility.  These stretches are additional exercise for major muscle groups that are typically performed during regular warm up and cool down.   Hands Only CPR:  -Group verbal, video, and participation provides a basic overview of AHA guidelines for community CPR. Role-play of emergencies allow participants the opportunity to practice calling for help and chest compression technique with discussion of AED use.   Hypertension: -Group verbal and written instruction that provides a basic overview of hypertension including the most recent diagnostic guidelines, risk factor reduction with self-care instructions and medication management.    Nutrition I class: Heart Healthy Eating:  -Group instruction provided by PowerPoint slides, verbal discussion, and written materials to support subject matter. The instructor gives an explanation and review of the Therapeutic Lifestyle Changes diet recommendations, which includes a discussion on lipid goals, dietary fat, sodium, fiber, plant stanol/sterol esters, sugar, and the components of a well-balanced, healthy diet.   Nutrition II class:  Lifestyle Skills:  -Group instruction provided by PowerPoint slides, verbal discussion, and written materials to support subject matter. The instructor gives an explanation and review of label reading, grocery shopping for heart health, heart healthy recipe modifications, and ways to make healthier choices when eating out.   Diabetes Question & Answer:  -Group instruction provided by PowerPoint slides, verbal discussion, and written materials to support subject matter. The instructor gives an explanation and review of diabetes  co-morbidities, pre- and post-prandial blood glucose goals, pre-exercise blood glucose goals, signs, symptoms, and treatment of hypoglycemia and hyperglycemia, and foot care basics.   Diabetes Blitz:  -Group instruction provided by PowerPoint slides, verbal discussion, and written materials to support subject matter. The instructor gives an explanation and review of the physiology behind type 1 and type 2 diabetes, diabetes medications and rational behind using different medications, pre- and post-prandial blood glucose recommendations and Hemoglobin A1c goals, diabetes diet, and exercise including blood glucose guidelines for exercising safely.    Portion Distortion:  -Group instruction provided by PowerPoint slides, verbal discussion, written materials, and food models to support subject matter. The instructor gives an explanation of serving size versus portion size, changes in portions sizes over the last 20 years, and what consists of a serving from each food group.   Stress Management:  -Group instruction provided by verbal instruction, video, and written materials to support subject matter.  Instructors review role of stress in heart disease and how to cope with stress positively.     Exercising on Your Own:  -Group instruction provided by verbal instruction, power point, and written materials to support subject.  Instructors discuss benefits of exercise, components  of exercise, frequency and intensity of exercise, and end points for exercise.  Also discuss use of nitroglycerin and activating EMS.  Review options of places to exercise outside of rehab.  Review guidelines for sex with heart disease.   Cardiac Drugs I:  -Group instruction provided by verbal instruction and written materials to support subject.  Instructor reviews cardiac drug classes: antiplatelets, anticoagulants, beta blockers, and statins.  Instructor discusses reasons, side effects, and lifestyle considerations for each drug class.   Cardiac Drugs II:  -Group instruction provided by verbal instruction and written materials to support subject.  Instructor reviews cardiac drug classes: angiotensin converting enzyme inhibitors (ACE-I), angiotensin II receptor blockers (ARBs), nitrates, and calcium channel blockers.  Instructor discusses reasons, side effects, and lifestyle considerations for each drug class.   Anatomy and Physiology of the Circulatory System:  Group verbal and written instruction and models provide basic cardiac anatomy and physiology, with the coronary electrical and arterial systems. Review of: AMI, Angina, Valve disease, Heart Failure, Peripheral Artery Disease, Cardiac Arrhythmia, Pacemakers, and the ICD.   Other Education:  -Group or individual verbal, written, or video instructions that support the educational goals of the cardiac rehab program.   Holiday Eating Survival Tips:  -Group instruction provided by PowerPoint slides, verbal discussion, and written materials to support subject matter. The instructor gives patients tips, tricks, and techniques to help them not only survive but enjoy the holidays despite the onslaught of food that accompanies the holidays.   Knowledge Questionnaire Score: Knowledge Questionnaire Score - 01/10/18 1159    Knowledge Questionnaire Score          Pre Score  22/24           Core Components/Risk Factors/Patient Goals at  Admission: Personal Goals and Risk Factors at Admission - 01/10/18 1022    Core Components/Risk Factors/Patient Goals on Admission          Diabetes  Yes    Intervention  Provide education about signs/symptoms and action to take for hypo/hyperglycemia.;Provide education about proper nutrition, including hydration, and aerobic/resistive exercise prescription along with prescribed medications to achieve blood glucose in normal ranges: Fasting glucose 65-99 mg/dL    Expected Outcomes  Short Term: Participant verbalizes understanding of the signs/symptoms and immediate care of hyper/hypoglycemia, proper foot  care and importance of medication, aerobic/resistive exercise and nutrition plan for blood glucose control.;Long Term: Attainment of HbA1C < 7%.    Lipids  Yes    Intervention  Provide education and support for participant on nutrition & aerobic/resistive exercise along with prescribed medications to achieve LDL 70mg , HDL >40mg .    Expected Outcomes  Short Term: Participant states understanding of desired cholesterol values and is compliant with medications prescribed. Participant is following exercise prescription and nutrition guidelines.;Long Term: Cholesterol controlled with medications as prescribed, with individualized exercise RX and with personalized nutrition plan. Value goals: LDL < 70mg , HDL > 40 mg.    Stress  Yes    Intervention  Offer individual and/or small group education and counseling on adjustment to heart disease, stress management and health-related lifestyle change. Teach and support self-help strategies.;Refer participants experiencing significant psychosocial distress to appropriate mental health specialists for further evaluation and treatment. When possible, include family members and significant others in education/counseling sessions.    Expected Outcomes  Short Term: Participant demonstrates changes in health-related behavior, relaxation and other stress management skills,  ability to obtain effective social support, and compliance with psychotropic medications if prescribed.;Long Term: Emotional wellbeing is indicated by absence of clinically significant psychosocial distress or social isolation.           Core Components/Risk Factors/Patient Goals Review:    Core Components/Risk Factors/Patient Goals at Discharge (Final Review):    ITP Comments: ITP Comments    Row Name 01/10/18 1017   ITP Comments  Medical Director- Dr. Fransico Him, MD      Comments:  Patient attended orientation from [facility]  to [phone]  to review rules and guidelines for program. Completed 6 minute walk test, Intitial ITP, and exercise prescription.  VSS. Telemetry-sinus rhythm, frequent PAC with pairs.   Asymptomatic.  Andi Hence, RN, BSN Cardiac Pulmonary Rehab

## 2018-01-10 NOTE — Progress Notes (Signed)
Tyde Lamison 72 y.o. male DOB: Apr 15, 1946 MRN: [id number]      Nutrition Note  1. 09/30/2017 S/P CABG (coronary artery bypass graft)   2. 09/27/2017 NSTEMI (non-ST elevated myocardial infarction) [facility])    Past Medical History:  Diagnosis Date  . Abscess of right thigh 11/06/2013  . Arthritis   . Asthma   . Cancer [facility])    prostate  . Chronic cholecystitis with calculus 10/19/2013  . Coronary artery disease 09/30/2017   NSTEMI 6/19 >> 3V CAD >> s/p CABG (LIMA-LAD, SVG-D1, SVG-OM/distal LCx and SVG-proximal PDA)  . Depression   . Diabetes mellitus without complication (HCC)    , diet controlled  . GERD (gastroesophageal reflux disease)   . H/O colonoscopy 03/12/2009  . Hernia, inguinal, left 12/17/2011  . History of kidney stones   . History of pressure ulcer on buttock 03/22/2017  . Hyperlipidemia   . Hypothyroidism   . Inguinal hernia   . Neck pain   . NSTEMI (non-ST elevated myocardial infarction) ([place])   . Postoperative atrial flutter (HCC)    Post CABG >> converted on Amiodarone  . Thyroid disease    hyperthyroidism; surgery changed him to hypo  . Type 2 diabetes mellitus with complication, without long-term current use of insulin ([place]) 10/18/2017   Meds reviewed.   Current Outpatient Medications (Endocrine & Metabolic):  .  insulin glargine (LANTUS) 100 UNIT/ML injection, Inject 10 Units into the skin every evening.  Marland Kitchen  levothyroxine (SYNTHROID) 200 MCG tablet, Take 200 mcg every evening by mouth.  .  metFORMIN (GLUCOPHAGE) 500 MG tablet, Take 500 mg 2 (two) times daily with a meal by mouth.  Current Outpatient Medications (Cardiovascular):  .  atorvastatin (LIPITOR) 80 MG tablet, Take 1 tablet (80 mg total) by mouth daily at 6 PM. .  metoprolol tartrate (LOPRESSOR) 25 MG tablet, Take 1 tablet (25 mg total) by mouth 2 (two) times daily.  Current Outpatient Medications (Respiratory):  .  fluticasone (FLONASE) 50 MCG/ACT nasal spray, Place 2 sprays daily into both  nostrils. .  Fluticasone-Salmeterol (ADVAIR) 250-50 MCG/DOSE AEPB, Inhale 1 puff daily as needed into the lungs (shortness of breath).   Current Outpatient Medications (Analgesics):  .  aspirin EC 81 MG tablet, Take 1 tablet (81 mg total) by mouth daily. .  nabumetone (RELAFEN) 500 MG tablet, Take 500 mg by mouth See admin instructions. .  traMADol (ULTRAM) 50 MG tablet, Take 1 tablet (50 mg total) by mouth every 12 (twelve) hours as needed. (Patient not taking: Reported on 01/05/2018)   Current Outpatient Medications (Other):  Marland Kitchen  buPROPion (WELLBUTRIN XL) 150 MG 24 hr tablet, Take 150 mg daily by mouth. .  MELATONIN PO, Take 1 tablet at bedtime as needed by mouth (sleep). .  Multiple Vitamin (MULTIVITAMIN WITH MINERALS) TABS tablet, Take 1 tablet daily by mouth. .  pantoprazole (PROTONIX) 40 MG tablet, Take 40 mg daily by mouth. .  SUPER B COMPLEX/C CAPS, Take 1 tablet daily by mouth.   HT: Ht Readings from Last 1 Encounters:  01/10/18 5\' 11"  (1.803 m)    WT: Wt Readings from Last 5 Encounters:  01/10/18 169 lb 12.1 oz (77 kg)  12/28/17 170 lb (77.1 kg)  10/24/17 169 lb 11.2 oz (77 kg)  10/18/17 175 lb 6.4 oz (79.6 kg)  10/04/17 185 lb (83.9 kg)     Body mass index is 23.68 kg/m.   Current tobacco use? No  Labs:  Lipid Panel  No results found for: CHOL, TRIG,  HDL, CHOLHDL, VLDL, LDLCALC, LDLDIRECT  Lab Results  Component Value Date   HGBA1C 6.9 (H) 09/29/2017   CBG (last 3)  No results for input(s): GLUCAP in the last 72 hours.  Nutrition Note Spoke with pt. Nutrition plan and goals reviewed with pt. Pt is following Step 2 of the Therapeutic Lifestyle Changes diet. Pt wants to maintain weight/ gain 10 lbs. Wt gain/ weight maintenance tips reviewed (label reading, how to build a healthy plate, portion sizes, eating frequently across the day). Pt is diabetic. Last A1c indicates blood glucose well-controlled. This Probation officer went over Diabetes Education test results. Pt shared  he is comfortable managing his diabetes, his son was Type 1 diabetic and from this experience he was already comfortable meal planning and counting carbohydrates. Pt checks CBG's 1-2 times a day. Fasting CBG's reportedly 120-130's mg/dL. Per discussion, pt does not use canned/convenience foods often. Pt rarely adds salt to food. Pt eats out infrequently. Pt expressed understanding of the information reviewed. Pt aware of nutrition education classes offered and plans on attending nutrition classes.  Nutrition Diagnosis ? Food-and nutrition-related knowledge deficit related to lack of exposure to information as related to diagnosis of: ? CVD ? DM   Nutrition Intervention ? Pt's individual nutrition plan and goals reviewed with pt. ? Pt given handouts for: ? Nutrition I class ? Nutrition II class  ? Diabetes Blitz Class ? Diabetes Q & A class  ? Consistent vit K diet ? low sodium ? DM ? pre-diabetes  Nutrition Goal(s):   ? Pt to identify and limit food sources of saturated fat, trans fat, refined carbohydrates and sodium  Plan:  ? Pt to attend nutrition classes ? Nutrition I ? Nutrition II ? Portion Distortion  ? Diabetes Blitz ? Diabetes Q & Ae determined ? Will provide client-centered nutrition education as part of interdisciplinary care ? Monitor and evaluate progress toward nutrition goal with team.   Laurina Bustle, MS, RD, LDN 01/10/2018 1:53 PM

## 2018-01-11 ENCOUNTER — Telehealth (HOSPITAL_COMMUNITY): Payer: Self-pay | Admitting: *Deleted

## 2018-01-16 ENCOUNTER — Encounter (HOSPITAL_COMMUNITY): Payer: 59

## 2018-01-18 ENCOUNTER — Encounter (HOSPITAL_COMMUNITY)
Admission: RE | Admit: 2018-01-18 | Discharge: 2018-01-18 | Disposition: A | Discharge status: Home / Self Care | Payer: 59 | Source: Ambulatory Visit | Attending: Cardiovascular Disease | Admitting: Cardiovascular Disease

## 2018-01-18 ENCOUNTER — Encounter (HOSPITAL_COMMUNITY): Payer: Self-pay

## 2018-01-18 ENCOUNTER — Encounter (HOSPITAL_COMMUNITY): Payer: 59

## 2018-01-18 DIAGNOSIS — I214 Non-ST elevation (NSTEMI) myocardial infarction: Secondary | ICD-10-CM | POA: Diagnosis not present

## 2018-01-18 DIAGNOSIS — Z951 Presence of aortocoronary bypass graft: Secondary | ICD-10-CM

## 2018-01-18 LAB — GLUCOSE, CAPILLARY
GLUCOSE-CAPILLARY: 176 mg/dL — AB (ref 70–99)
Glucose-Capillary: 129 mg/dL — ABNORMAL HIGH (ref 70–99)

## 2018-01-18 NOTE — Progress Notes (Signed)
Daily Session Note  Patient Details  Name: Dan Lester MRN: [id number] Date of Birth: 1945-11-18 Referring Provider:     CARDIAC REHAB PHASE II ORIENTATION from 01/10/2018 in [place]  Referring Provider  Mertie Moores, MD      Encounter Date: 01/18/2018  Check In: Session Check In - 01/18/18 0745      Check-In   Supervising physician immediately available to respond to emergencies  [facility] immediately available    Physician(s)  Dr.Danford     Location  [facility]    Staff Present  Seward Carol, MS, ACSM CEP, Exercise Physiologist;Joann Rion, RN, Mosie Epstein, MS,ACSM CEP, Exercise Physiologist;Marylon Verno Karle Starch, RN, BSN    Medication changes reported      No    Fall or balance concerns reported     No    Tobacco Cessation  No Change    Warm-up and Cool-down  Performed as group-led instruction    Resistance Training Performed  No    VAD Patient?  No    PAD/SET Patient?  No      Pain Assessment   Currently in Pain?  No/denies       Capillary Blood Glucose: Results for orders placed or performed during the hospital encounter of 01/18/18 (from the past 24 hour(s))  Glucose, capillary     Status: Abnormal   Collection Time: 01/18/18  6:59 AM  Result Value Ref Range   Glucose-Capillary 176 (H) 70 - 99 mg/dL  Glucose, capillary     Status: Abnormal   Collection Time: 01/18/18  7:54 AM  Result Value Ref Range   Glucose-Capillary 129 (H) 70 - 99 mg/dL    Exercise Prescription Changes - 01/18/18 0800      Response to Exercise   Blood Pressure (Admit)  110/60    Blood Pressure (Exercise)  148/80    Blood Pressure (Exit)  108/70    Heart Rate (Admit)  69 bpm    Heart Rate (Exercise)  76 bpm    Heart Rate (Exit)  67 bpm    Rating of Perceived Exertion (Exercise)  11    Perceived Dyspnea (Exercise)  0    Symptoms  None     Comments  Pt oriented to exercise equipment     Duration  Progress to 30 minutes of   aerobic without signs/symptoms of physical distress    Intensity  THRR unchanged      Progression   Progression  Continue to progress workloads to maintain intensity without signs/symptoms of physical distress.    Average METs  3.1      Resistance Training   Training Prescription  No      Interval Training   Interval Training  No      Bike   Level  1.1    Minutes  10    METs  3.7      NuStep   Level  3   Missed Average on NuStep   SPM  85    Minutes  10      Track   Laps  12    Minutes  10    METs  3.09       Social History   Tobacco Use  Smoking Status Former Smoker  . Types: Cigars  Smokeless Tobacco Never Used    Goals Met:  Exercise tolerated well  Goals Unmet:  Not Applicable  Comments: Pt started cardiac rehab today.  Pt tolerated light exercise without  difficulty. VSS, telemetry-SR, asymptomatic.  Medication list reconciled. Pt denies barriers to medicaiton compliance.  PSYCHOSOCIAL ASSESSMENT:  PHQ-0. Pt exhibits positive coping skills, hopeful outlook with supportive family. No psychosocial needs identified at this time, no psychosocial interventions necessary.   Pt oriented to exercise equipment and routine.    Understanding verbalized.   Dr. Fransico Him is Medical Director for Cardiac Rehab at [facility].

## 2018-01-20 ENCOUNTER — Encounter (HOSPITAL_COMMUNITY): Payer: 59

## 2018-01-20 ENCOUNTER — Encounter (HOSPITAL_COMMUNITY)
Admission: RE | Admit: 2018-01-20 | Discharge: 2018-01-20 | Disposition: A | Discharge status: Home / Self Care | Payer: 59 | Source: Ambulatory Visit | Attending: Cardiovascular Disease | Admitting: Cardiovascular Disease

## 2018-01-20 DIAGNOSIS — I214 Non-ST elevation (NSTEMI) myocardial infarction: Secondary | ICD-10-CM

## 2018-01-20 DIAGNOSIS — Z951 Presence of aortocoronary bypass graft: Secondary | ICD-10-CM

## 2018-01-20 LAB — GLUCOSE, CAPILLARY
GLUCOSE-CAPILLARY: 197 mg/dL — AB (ref 70–99)
Glucose-Capillary: 130 mg/dL — ABNORMAL HIGH (ref 70–99)

## 2018-01-23 ENCOUNTER — Encounter (HOSPITAL_COMMUNITY)
Admission: RE | Admit: 2018-01-23 | Discharge: 2018-01-23 | Disposition: A | Discharge status: Home / Self Care | Payer: 59 | Source: Ambulatory Visit | Attending: Cardiovascular Disease | Admitting: Cardiovascular Disease

## 2018-01-23 ENCOUNTER — Encounter (HOSPITAL_COMMUNITY): Payer: 59

## 2018-01-23 DIAGNOSIS — I214 Non-ST elevation (NSTEMI) myocardial infarction: Secondary | ICD-10-CM

## 2018-01-23 DIAGNOSIS — Z951 Presence of aortocoronary bypass graft: Secondary | ICD-10-CM

## 2018-01-25 ENCOUNTER — Encounter (HOSPITAL_COMMUNITY)
Admission: RE | Admit: 2018-01-25 | Discharge: 2018-01-25 | Disposition: A | Discharge status: Home / Self Care | Payer: 59 | Source: Ambulatory Visit | Attending: Cardiovascular Disease | Admitting: Cardiovascular Disease

## 2018-01-25 ENCOUNTER — Encounter (HOSPITAL_COMMUNITY): Payer: 59

## 2018-01-25 DIAGNOSIS — E785 Hyperlipidemia, unspecified: Secondary | ICD-10-CM | POA: Diagnosis not present

## 2018-01-25 DIAGNOSIS — Z79890 Hormone replacement therapy (postmenopausal): Secondary | ICD-10-CM | POA: Insufficient documentation

## 2018-01-25 DIAGNOSIS — Z7982 Long term (current) use of aspirin: Secondary | ICD-10-CM | POA: Insufficient documentation

## 2018-01-25 DIAGNOSIS — I214 Non-ST elevation (NSTEMI) myocardial infarction: Secondary | ICD-10-CM | POA: Insufficient documentation

## 2018-01-25 DIAGNOSIS — I2510 Atherosclerotic heart disease of native coronary artery without angina pectoris: Secondary | ICD-10-CM | POA: Insufficient documentation

## 2018-01-25 DIAGNOSIS — Z79899 Other long term (current) drug therapy: Secondary | ICD-10-CM | POA: Diagnosis not present

## 2018-01-25 DIAGNOSIS — Z87891 Personal history of nicotine dependence: Secondary | ICD-10-CM | POA: Diagnosis not present

## 2018-01-25 DIAGNOSIS — Z951 Presence of aortocoronary bypass graft: Secondary | ICD-10-CM | POA: Diagnosis not present

## 2018-01-25 DIAGNOSIS — K219 Gastro-esophageal reflux disease without esophagitis: Secondary | ICD-10-CM | POA: Insufficient documentation

## 2018-01-25 DIAGNOSIS — Z794 Long term (current) use of insulin: Secondary | ICD-10-CM | POA: Diagnosis not present

## 2018-01-25 DIAGNOSIS — E039 Hypothyroidism, unspecified: Secondary | ICD-10-CM | POA: Insufficient documentation

## 2018-01-25 DIAGNOSIS — F329 Major depressive disorder, single episode, unspecified: Secondary | ICD-10-CM | POA: Diagnosis not present

## 2018-01-25 DIAGNOSIS — E119 Type 2 diabetes mellitus without complications: Secondary | ICD-10-CM | POA: Diagnosis not present

## 2018-01-26 NOTE — Progress Notes (Signed)
Cardiac Individual Treatment Plan  Patient Details  Name: [name] MRN: [id number] Date of Birth: 1945/07/22 Referring Provider:     [place] from 01/10/2018 in [place]  Referring Provider  Nahser, Arnette Norris, MD      Initial Encounter Date:    CARDIAC REHAB PHASE II ORIENTATION from 01/10/2018 in [facility]  Date  01/10/18      Visit Diagnosis: 09/27/2017 NSTEMI (non-ST elevated myocardial infarction) ([place])  09/30/2017 S/P CABG (coronary artery bypass graft)  Patient's Home Medications on Admission:  Current Outpatient Medications:  .  aspirin EC 81 MG tablet, Take 1 tablet (81 mg total) by mouth daily., Disp: , Rfl:  .  atorvastatin (LIPITOR) 80 MG tablet, Take 1 tablet (80 mg total) by mouth daily at 6 PM., Disp: 90 tablet, Rfl: 3 .  buPROPion (WELLBUTRIN XL) 150 MG 24 hr tablet, Take 150 mg daily by mouth., Disp: , Rfl:  .  fluticasone (FLONASE) 50 MCG/ACT nasal spray, Place 2 sprays daily into both nostrils., Disp: , Rfl:  .  Fluticasone-Salmeterol (ADVAIR) 250-50 MCG/DOSE AEPB, Inhale 1 puff daily as needed into the lungs (shortness of breath). , Disp: , Rfl:  .  insulin glargine (LANTUS) 100 UNIT/ML injection, Inject 10 Units into the skin every evening. , Disp: , Rfl:  .  levothyroxine (SYNTHROID) 200 MCG tablet, Take 200 mcg every evening by mouth. , Disp: , Rfl:  .  MELATONIN PO, Take 1 tablet at bedtime as needed by mouth (sleep)., Disp: , Rfl:  .  metFORMIN (GLUCOPHAGE) 500 MG tablet, Take 500 mg 2 (two) times daily with a meal by mouth., Disp: , Rfl:  .  metoprolol tartrate (LOPRESSOR) 25 MG tablet, Take 1 tablet (25 mg total) by mouth 2 (two) times daily., Disp: 180 tablet, Rfl: 3 .  Multiple Vitamin (MULTIVITAMIN WITH MINERALS) TABS tablet, Take 1 tablet daily by mouth., Disp: , Rfl:  .  nabumetone (RELAFEN) 500 MG tablet, Take 500 mg by mouth See admin instructions., Disp:  , Rfl: 6 .  pantoprazole (PROTONIX) 40 MG tablet, Take 40 mg daily by mouth., Disp: , Rfl:  .  SUPER B COMPLEX/C CAPS, Take 1 tablet daily by mouth., Disp: , Rfl:  .  traMADol (ULTRAM) 50 MG tablet, Take 1 tablet (50 mg total) by mouth every 12 (twelve) hours as needed. (Patient not taking: Reported on 01/05/2018), Disp: 20 tablet, Rfl: 0  Past Medical History: Past Medical History:  Diagnosis Date  . Abscess of right thigh 11/06/2013  . Arthritis   . Asthma   . Cancer [facility])    prostate  . Chronic cholecystitis with calculus 10/19/2013  . Coronary artery disease 09/30/2017   NSTEMI 6/19 >> 3V CAD >> s/p CABG (LIMA-LAD, SVG-D1, SVG-OM/distal LCx and SVG-proximal PDA)  . Depression   . Diabetes mellitus without complication (HCC)    , diet controlled  . GERD (gastroesophageal reflux disease)   . H/O colonoscopy 03/12/2009  . Hernia, inguinal, left 12/17/2011  . History of kidney stones   . History of pressure ulcer on buttock 03/22/2017  . Hyperlipidemia   . Hypothyroidism   . Inguinal hernia   . Neck pain   . NSTEMI (non-ST elevated myocardial infarction) ([place])   . Postoperative atrial flutter (HCC)    Post CABG >> converted on Amiodarone  . Thyroid disease    hyperthyroidism; surgery changed him to hypo  . Type 2 diabetes mellitus with complication,  without long-term current use of insulin ([place]) 10/18/2017    Tobacco Use: Social History   Tobacco Use  Smoking Status Former Smoker  . Types: Cigars  Smokeless Tobacco Never Used    Labs: Recent Review Flowsheet Data    Labs for ITP Cardiac and Pulmonary Rehab Latest Ref Rng & Units 09/30/2017 09/30/2017 10/01/2017 10/01/2017 10/01/2017   Hemoglobin A1c 4.8 - 5.6 % - - - - -   PHART 7.350 - 7.450 7.325(L) - 7.358 7.360 -   PCO2ART 32.0 - 48.0 mmHg 41.0 - 42.4 38.3 -   HCO3 20.0 - 28.0 mmol/L 21.4 - 23.8 21.7 -   TCO2 22 - 32 mmol/L 23 22 25 23  21(L)   ACIDBASEDEF 0.0 - 2.0 mmol/L 4.0(H) - 2.0 3.0(H) -   O2SAT % 94.0 - 96.0 97.0 -       Capillary Blood Glucose: Lab Results  Component Value Date   GLUCAP 130 (H) 01/20/2018   GLUCAP 197 (H) 01/20/2018   GLUCAP 129 (H) 01/18/2018   GLUCAP 176 (H) 01/18/2018   GLUCAP 159 (H) 10/04/2017     Exercise Target Goals: Exercise Program Goal: Individual exercise prescription set using results from initial 6 min walk test and THRR while considering  patient's activity barriers and safety.   Exercise Prescription Goal: Initial exercise prescription builds to 30-45 minutes a day of aerobic activity, 2-3 days per week.  Home exercise guidelines will be given to patient during program as part of exercise prescription that the participant will acknowledge.  Activity Barriers & Risk Stratification: Activity Barriers & Cardiac Risk Stratification - 01/10/18 1018      Activity Barriers & Cardiac Risk Stratification   Activity Barriers  None    Cardiac Risk Stratification  High       6 Minute Walk: 6 Minute Walk    Row Name 01/10/18 0841         6 Minute Walk   Phase  Initial     Distance  1523 feet     Walk Time  6 minutes     # of Rest Breaks  0     MPH  2.88     METS  3.29     RPE  12     VO2 Peak  11.51     Symptoms  Yes (comment)     Comments  Patient c/o fatigue at end of walk test.     Resting HR  66 bpm     Resting BP  124/80     Resting Oxygen Saturation   98 %     Exercise Oxygen Saturation  during 6 min walk  97 %     Max Ex. HR  75 bpm     Max Ex. BP  118/82     2 Minute Post BP  124/78        Oxygen Initial Assessment:   Oxygen Re-Evaluation:   Oxygen Discharge (Final Oxygen Re-Evaluation):   Initial Exercise Prescription: Initial Exercise Prescription - 01/10/18 1000      Date of Initial Exercise RX and Referring Provider   Date  01/10/18    Referring Provider  Nahser, Arnette Norris, MD      Bike   Level  0.9    Minutes  10    METs  3.19      NuStep   Level  3    SPM  85    Minutes  10    METs  3  Track   Laps  12     Minutes  10    METs  3.09      Prescription Details   Frequency (times per week)  3    Duration  Progress to 30 minutes of continuous aerobic without signs/symptoms of physical distress      Intensity   THRR 40-80% of Max Heartrate  60-120    Ratings of Perceived Exertion  11-13    Perceived Dyspnea  0-4      Progression   Progression  Continue to progress workloads to maintain intensity without signs/symptoms of physical distress.      Resistance Training   Training Prescription  Yes    Weight  3lbs    Reps  10-15       Perform Capillary Blood Glucose checks as needed.  Exercise Prescription Changes: Exercise Prescription Changes    Row Name 01/18/18 0800 01/25/18 1600           Response to Exercise   Blood Pressure (Admit)  110/60  158/80      Blood Pressure (Exercise)  148/80  138/84      Blood Pressure (Exit)  108/70  106/88      Heart Rate (Admit)  69 bpm  74 bpm      Heart Rate (Exercise)  76 bpm  80 bpm      Heart Rate (Exit)  67 bpm  66 bpm      Rating of Perceived Exertion (Exercise)  11  12      Perceived Dyspnea (Exercise)  0  0      Symptoms  None   None      Comments  Pt oriented to exercise equipment   -      Duration  Progress to 30 minutes of  aerobic without signs/symptoms of physical distress  Continue with 30 min of aerobic exercise without signs/symptoms of physical distress.      Intensity  THRR unchanged  THRR unchanged        Progression   Progression  Continue to progress workloads to maintain intensity without signs/symptoms of physical distress.  Continue to progress workloads to maintain intensity without signs/symptoms of physical distress.      Average METs  3.1  3.28        Resistance Training   Training Prescription  No  No        Interval Training   Interval Training  No  No        Bike   Level  1.1  1.1      Minutes  10  10      METs  3.7  3.67        NuStep   Level  3 Missed Average on NuStep  3      SPM  85  75       Minutes  10  10      METs  -  2.4        Track   Laps  12  16      Minutes  10  10      METs  3.09  3.76         Exercise Comments: Exercise Comments    Row Name 01/18/18 0810           Exercise Comments  Pt's first day of exercise. Pt oriented to exercise equipment. Pt responded well to exercise workloads.  Exercise Goals and Review: Exercise Goals    Row Name 01/10/18 0900             Exercise Goals   Increase Physical Activity  Yes       Intervention  Provide advice, education, support and counseling about physical activity/exercise needs.;Develop an individualized exercise prescription for aerobic and resistive training based on initial evaluation findings, risk stratification, comorbidities and participant's personal goals.       Expected Outcomes  Short Term: Attend rehab on a regular basis to increase amount of physical activity.;Long Term: Exercising regularly at least 3-5 days a week.;Long Term: Add in home exercise to make exercise part of routine and to increase amount of physical activity.       Increase Strength and Stamina  Yes       Intervention  Develop an individualized exercise prescription for aerobic and resistive training based on initial evaluation findings, risk stratification, comorbidities and participant's personal goals.;Provide advice, education, support and counseling about physical activity/exercise needs.       Expected Outcomes  Short Term: Increase workloads from initial exercise prescription for resistance, speed, and METs.;Short Term: Perform resistance training exercises routinely during rehab and add in resistance training at home;Long Term: Improve cardiorespiratory fitness, muscular endurance and strength as measured by increased METs and functional capacity (6MWT)       Able to understand and use rate of perceived exertion (RPE) scale  Yes       Intervention  Provide education and explanation on how to use RPE scale       Expected  Outcomes  Short Term: Able to use RPE daily in rehab to express subjective intensity level;Long Term:  Able to use RPE to guide intensity level when exercising independently       Knowledge and understanding of Target Heart Rate Range (THRR)  Yes       Intervention  Provide education and explanation of THRR including how the numbers were predicted and where they are located for reference       Expected Outcomes  Short Term: Able to state/look up THRR;Long Term: Able to use THRR to govern intensity when exercising independently;Short Term: Able to use daily as guideline for intensity in rehab       Able to check pulse independently  Yes       Intervention  Provide education and demonstration on how to check pulse in carotid and radial arteries.;Review the importance of being able to check your own pulse for safety during independent exercise       Expected Outcomes  Short Term: Able to explain why pulse checking is important during independent exercise;Long Term: Able to check pulse independently and accurately       Understanding of Exercise Prescription  Yes       Intervention  Provide education, explanation, and written materials on patient's individual exercise prescription       Expected Outcomes  Short Term: Able to explain program exercise prescription;Long Term: Able to explain home exercise prescription to exercise independently          Exercise Goals Re-Evaluation :   Discharge Exercise Prescription (Final Exercise Prescription Changes): Exercise Prescription Changes - 01/25/18 1600      Response to Exercise   Blood Pressure (Admit)  158/80    Blood Pressure (Exercise)  138/84    Blood Pressure (Exit)  106/88    Heart Rate (Admit)  74 bpm    Heart Rate (Exercise)  80 bpm  Heart Rate (Exit)  66 bpm    Rating of Perceived Exertion (Exercise)  12    Perceived Dyspnea (Exercise)  0    Symptoms  None    Duration  Continue with 30 min of aerobic exercise without signs/symptoms of  physical distress.    Intensity  THRR unchanged      Progression   Progression  Continue to progress workloads to maintain intensity without signs/symptoms of physical distress.    Average METs  3.28      Resistance Training   Training Prescription  No      Interval Training   Interval Training  No      Bike   Level  1.1    Minutes  10    METs  3.67      NuStep   Level  3    SPM  75    Minutes  10    METs  2.4      Track   Laps  16    Minutes  10    METs  3.76       Nutrition:  Target Goals: Understanding of nutrition guidelines, daily intake of sodium 1500mg , cholesterol 200mg , calories 30% from fat and 7% or less from saturated fats, daily to have 5 or more servings of fruits and vegetables.  Biometrics: Pre Biometrics - 01/10/18 0745      Pre Biometrics   Height  5\' 11"  (1.803 m)    Weight  77 kg    Waist Circumference  37 inches    Hip Circumference  39.5 inches    Waist to Hip Ratio  0.94 %    BMI (Calculated)  23.69    Triceps Skinfold  7 mm    % Body Fat  21.5 %    Grip Strength  37 kg    Flexibility  9.5 in    Single Leg Stand  15.03 seconds        Nutrition Therapy Plan and Nutrition Goals: Nutrition Therapy & Goals - 01/10/18 1357      Nutrition Therapy   Diet  heart healthy, carb modified      Personal Nutrition Goals   Nutrition Goal  Pt to identify and limit food sources of saturated fat, trans fat, refined carbohydrates and sodium      Intervention Plan   Intervention  Prescribe, educate and counsel regarding individualized specific dietary modifications aiming towards targeted core components such as weight, hypertension, lipid management, diabetes, heart failure and other comorbidities.    Expected Outcomes  Short Term Goal: Understand basic principles of dietary content, such as calories, fat, sodium, cholesterol and nutrients.;Long Term Goal: Adherence to prescribed nutrition plan.       Nutrition Assessments: Nutrition  Assessments - 01/10/18 1402      MEDFICTS Scores   Pre Score  36       Nutrition Goals Re-Evaluation:   Nutrition Goals Re-Evaluation:   Nutrition Goals Discharge (Final Nutrition Goals Re-Evaluation):   Psychosocial: Target Goals: Acknowledge presence or absence of significant depression and/or stress, maximize coping skills, provide positive support system. Participant is able to verbalize types and ability to use techniques and skills needed for reducing stress and depression.  Initial Review & Psychosocial Screening: Initial Psych Review & Screening - 01/10/18 1159      Initial Review   Current issues with  None Identified      Family Dynamics   Good Support System?  Yes   family and friends  Barriers   Psychosocial barriers to participate in program  There are no identifiable barriers or psychosocial needs.      Screening Interventions   Interventions  Encouraged to exercise       Quality of Life Scores: Quality of Life - 01/10/18 1224      Quality of Life   Select  Quality of Life      Quality of Life Scores   Health/Function Pre  27.2 %    Socioeconomic Pre  30 %    Psych/Spiritual Pre  30 %    Family Pre  30 %    GLOBAL Pre  28.8 %      Scores of 19 and below usually indicate a poorer quality of life in these areas.  A difference of  2-3 points is a clinically meaningful difference.  A difference of 2-3 points in the total score of the Quality of Life Index has been associated with significant improvement in overall quality of life, self-image, physical symptoms, and general health in studies assessing change in quality of life.  PHQ-9: Recent Review Flowsheet Data    Depression screen [facility] 2/9 01/18/2018   Decreased Interest 0   Down, Depressed, Hopeless 0   PHQ - 2 Score 0     Interpretation of Total Score  Total Score Depression Severity:  1-4 = Minimal depression, 5-9 = Mild depression, 10-14 = Moderate depression, 15-19 = Moderately severe  depression, 20-27 = Severe depression   Psychosocial Evaluation and Intervention: Psychosocial Evaluation - 01/18/18 1456      Psychosocial Evaluation & Interventions   Interventions  Encouraged to exercise with the program and follow exercise prescription    Comments  No psychosocial needs identified.  Laruth Bouchard enjoys learning about history and participating in battlefield tours and other outdoor activities.    Expected Outcomes  Laruth Bouchard will continue to maintain a positive outlook.    Continue Psychosocial Services   No Follow up required       Psychosocial Re-Evaluation: Psychosocial Re-Evaluation    [place] Name 01/23/18 1633             Psychosocial Re-Evaluation   Current issues with  None Identified       Comments  No psychosocial interventions necessary.       Expected Outcomes  Laruth Bouchard continues to maintain a positive outlook.        Interventions  Encouraged to attend Cardiac Rehabilitation for the exercise       Continue Psychosocial Services   No Follow up required          Psychosocial Discharge (Final Psychosocial Re-Evaluation): Psychosocial Re-Evaluation - 01/23/18 1633      Psychosocial Re-Evaluation   Current issues with  None Identified    Comments  No psychosocial interventions necessary.    Expected Outcomes  Laruth Bouchard continues to maintain a positive outlook.     Interventions  Encouraged to attend Cardiac Rehabilitation for the exercise    Continue Psychosocial Services   No Follow up required       Vocational Rehabilitation: Provide vocational rehab assistance to qualifying candidates.   Vocational Rehab Evaluation & Intervention:   Education: Education Goals: Education classes will be provided on a weekly basis, covering required topics. Participant will state understanding/return demonstration of topics presented.  Learning Barriers/Preferences:   Education Topics: Count Your Pulse:  -Group instruction provided by verbal instruction,  demonstration, patient participation and written materials to support subject.  Instructors address importance of being able to find  your pulse and how to count your pulse when at home without a heart monitor.  Patients get hands on experience counting their pulse with staff help and individually.   Heart Attack, Angina, and Risk Factor Modification:  -Group instruction provided by verbal instruction, video, and written materials to support subject.  Instructors address signs and symptoms of angina and heart attacks.    Also discuss risk factors for heart disease and how to make changes to improve heart health risk factors.   Functional Fitness:  -Group instruction provided by verbal instruction, demonstration, patient participation, and written materials to support subject.  Instructors address safety measures for doing things around the house.  Discuss how to get up and down off the floor, how to pick things up properly, how to safely get out of a chair without assistance, and balance training.   Meditation and Mindfulness:  -Group instruction provided by verbal instruction, patient participation, and written materials to support subject.  Instructor addresses importance of mindfulness and meditation practice to help reduce stress and improve awareness.  Instructor also leads participants through a meditation exercise.    Stretching for Flexibility and Mobility:  -Group instruction provided by verbal instruction, patient participation, and written materials to support subject.  Instructors lead participants through series of stretches that are designed to increase flexibility thus improving mobility.  These stretches are additional exercise for major muscle groups that are typically performed during regular warm up and cool down.   Hands Only CPR:  -Group verbal, video, and participation provides a basic overview of AHA guidelines for community CPR. Role-play of emergencies allow participants  the opportunity to practice calling for help and chest compression technique with discussion of AED use.   Hypertension: -Group verbal and written instruction that provides a basic overview of hypertension including the most recent diagnostic guidelines, risk factor reduction with self-care instructions and medication management.    Nutrition I class: Heart Healthy Eating:  -Group instruction provided by PowerPoint slides, verbal discussion, and written materials to support subject matter. The instructor gives an explanation and review of the Therapeutic Lifestyle Changes diet recommendations, which includes a discussion on lipid goals, dietary fat, sodium, fiber, plant stanol/sterol esters, sugar, and the components of a well-balanced, healthy diet.   Nutrition II class: Lifestyle Skills:  -Group instruction provided by PowerPoint slides, verbal discussion, and written materials to support subject matter. The instructor gives an explanation and review of label reading, grocery shopping for heart health, heart healthy recipe modifications, and ways to make healthier choices when eating out.   Diabetes Question & Answer:  -Group instruction provided by PowerPoint slides, verbal discussion, and written materials to support subject matter. The instructor gives an explanation and review of diabetes co-morbidities, pre- and post-prandial blood glucose goals, pre-exercise blood glucose goals, signs, symptoms, and treatment of hypoglycemia and hyperglycemia, and foot care basics.   Diabetes Blitz:  -Group instruction provided by PowerPoint slides, verbal discussion, and written materials to support subject matter. The instructor gives an explanation and review of the physiology behind type 1 and type 2 diabetes, diabetes medications and rational behind using different medications, pre- and post-prandial blood glucose recommendations and Hemoglobin A1c goals, diabetes diet, and exercise including blood  glucose guidelines for exercising safely.    Portion Distortion:  -Group instruction provided by PowerPoint slides, verbal discussion, written materials, and food models to support subject matter. The instructor gives an explanation of serving size versus portion size, changes in portions sizes over the last 20  years, and what consists of a serving from each food group.   Stress Management:  -Group instruction provided by verbal instruction, video, and written materials to support subject matter.  Instructors review role of stress in heart disease and how to cope with stress positively.     Exercising on Your Own:  -Group instruction provided by verbal instruction, power point, and written materials to support subject.  Instructors discuss benefits of exercise, components of exercise, frequency and intensity of exercise, and end points for exercise.  Also discuss use of nitroglycerin and activating EMS.  Review options of places to exercise outside of rehab.  Review guidelines for sex with heart disease.   Cardiac Drugs I:  -Group instruction provided by verbal instruction and written materials to support subject.  Instructor reviews cardiac drug classes: antiplatelets, anticoagulants, beta blockers, and statins.  Instructor discusses reasons, side effects, and lifestyle considerations for each drug class.   Cardiac Drugs II:  -Group instruction provided by verbal instruction and written materials to support subject.  Instructor reviews cardiac drug classes: angiotensin converting enzyme inhibitors (ACE-I), angiotensin II receptor blockers (ARBs), nitrates, and calcium channel blockers.  Instructor discusses reasons, side effects, and lifestyle considerations for each drug class.   Anatomy and Physiology of the Circulatory System:  Group verbal and written instruction and models provide basic cardiac anatomy and physiology, with the coronary electrical and arterial systems. Review of: AMI,  Angina, Valve disease, Heart Failure, Peripheral Artery Disease, Cardiac Arrhythmia, Pacemakers, and the ICD.   Other Education:  -Group or individual verbal, written, or video instructions that support the educational goals of the cardiac rehab program.   Holiday Eating Survival Tips:  -Group instruction provided by PowerPoint slides, verbal discussion, and written materials to support subject matter. The instructor gives patients tips, tricks, and techniques to help them not only survive but enjoy the holidays despite the onslaught of food that accompanies the holidays.   Knowledge Questionnaire Score: Knowledge Questionnaire Score - 01/10/18 1159      Knowledge Questionnaire Score   Pre Score  22/24       Core Components/Risk Factors/Patient Goals at Admission: Personal Goals and Risk Factors at Admission - 01/10/18 1022      Core Components/Risk Factors/Patient Goals on Admission   Diabetes  Yes    Intervention  Provide education about signs/symptoms and action to take for hypo/hyperglycemia.;Provide education about proper nutrition, including hydration, and aerobic/resistive exercise prescription along with prescribed medications to achieve blood glucose in normal ranges: Fasting glucose 65-99 mg/dL    Expected Outcomes  Short Term: Participant verbalizes understanding of the signs/symptoms and immediate care of hyper/hypoglycemia, proper foot care and importance of medication, aerobic/resistive exercise and nutrition plan for blood glucose control.;Long Term: Attainment of HbA1C < 7%.    Lipids  Yes    Intervention  Provide education and support for participant on nutrition & aerobic/resistive exercise along with prescribed medications to achieve LDL 70mg , HDL >40mg .    Expected Outcomes  Short Term: Participant states understanding of desired cholesterol values and is compliant with medications prescribed. Participant is following exercise prescription and nutrition  guidelines.;Long Term: Cholesterol controlled with medications as prescribed, with individualized exercise RX and with personalized nutrition plan. Value goals: LDL < 70mg , HDL > 40 mg.    Stress  Yes    Intervention  Offer individual and/or small group education and counseling on adjustment to heart disease, stress management and health-related lifestyle change. Teach and support self-help strategies.;Refer participants experiencing significant psychosocial  distress to appropriate mental health specialists for further evaluation and treatment. When possible, include family members and significant others in education/counseling sessions.    Expected Outcomes  Short Term: Participant demonstrates changes in health-related behavior, relaxation and other stress management skills, ability to obtain effective social support, and compliance with psychotropic medications if prescribed.;Long Term: Emotional wellbeing is indicated by absence of clinically significant psychosocial distress or social isolation.       Core Components/Risk Factors/Patient Goals Review:  Goals and Risk Factor Review    Row Name 01/18/18 1458 01/23/18 1633           Core Components/Risk Factors/Patient Goals Review   Personal Goals Review  Lipids;Diabetes;Stress  Lipids;Diabetes;Stress      Review  Pt with multiple CAD RFs willing to participate in CR exercise.  Laruth Bouchard would like ot be able to feel better and restore his strength.  Pt with multiple CAD RFs willing to participate in CR exercise.  Laruth Bouchard is off to a great start and is tolerating exercise well.  VSS.      Expected Outcomes  Pt will continue to participate in CR exercise, nutrition. and lifestyle modification opportunities.   Pt will continue to participate in CR exercise, nutrition. and lifestyle modification opportunities.          Core Components/Risk Factors/Patient Goals at Discharge (Final Review):  Goals and Risk Factor Review - 01/23/18 1633      Core  Components/Risk Factors/Patient Goals Review   Personal Goals Review  Lipids;Diabetes;Stress    Review  Pt with multiple CAD RFs willing to participate in CR exercise.  Laruth Bouchard is off to a great start and is tolerating exercise well.  VSS.    Expected Outcomes  Pt will continue to participate in CR exercise, nutrition. and lifestyle modification opportunities.        ITP Comments: ITP Comments    Row Name 01/10/18 1017 01/18/18 1632         ITP Comments  Medical Director- Dr. Fransico Him, MD  30 Day ITP Review.  Pt started exercise today and tolerated it well.          Comments: See ITP Comments.

## 2018-01-27 ENCOUNTER — Encounter (HOSPITAL_COMMUNITY)
Admission: RE | Admit: 2018-01-27 | Discharge: 2018-01-27 | Disposition: A | Discharge status: Home / Self Care | Payer: 59 | Source: Ambulatory Visit | Attending: Cardiovascular Disease | Admitting: Cardiovascular Disease

## 2018-01-27 ENCOUNTER — Encounter (HOSPITAL_COMMUNITY): Payer: 59

## 2018-01-27 DIAGNOSIS — I214 Non-ST elevation (NSTEMI) myocardial infarction: Secondary | ICD-10-CM | POA: Diagnosis not present

## 2018-01-27 DIAGNOSIS — Z951 Presence of aortocoronary bypass graft: Secondary | ICD-10-CM

## 2018-01-30 ENCOUNTER — Encounter (HOSPITAL_COMMUNITY)
Admission: RE | Admit: 2018-01-30 | Discharge: 2018-01-30 | Disposition: A | Discharge status: Home / Self Care | Payer: 59 | Source: Ambulatory Visit | Attending: Cardiovascular Disease | Admitting: Cardiovascular Disease

## 2018-01-30 ENCOUNTER — Encounter (HOSPITAL_COMMUNITY): Payer: 59

## 2018-01-30 DIAGNOSIS — I214 Non-ST elevation (NSTEMI) myocardial infarction: Secondary | ICD-10-CM | POA: Diagnosis not present

## 2018-01-30 DIAGNOSIS — Z951 Presence of aortocoronary bypass graft: Secondary | ICD-10-CM

## 2018-02-01 ENCOUNTER — Encounter (HOSPITAL_COMMUNITY): Payer: 59

## 2018-02-01 ENCOUNTER — Encounter (HOSPITAL_COMMUNITY)
Admission: RE | Admit: 2018-02-01 | Discharge: 2018-02-01 | Disposition: A | Discharge status: Home / Self Care | Payer: 59 | Source: Ambulatory Visit | Attending: Cardiovascular Disease | Admitting: Cardiovascular Disease

## 2018-02-01 DIAGNOSIS — Z951 Presence of aortocoronary bypass graft: Secondary | ICD-10-CM

## 2018-02-01 DIAGNOSIS — I214 Non-ST elevation (NSTEMI) myocardial infarction: Secondary | ICD-10-CM | POA: Diagnosis not present

## 2018-02-03 ENCOUNTER — Encounter (HOSPITAL_COMMUNITY)
Admission: RE | Admit: 2018-02-03 | Discharge: 2018-02-03 | Disposition: A | Discharge status: Home / Self Care | Payer: 59 | Source: Ambulatory Visit | Attending: Cardiovascular Disease | Admitting: Cardiovascular Disease

## 2018-02-03 ENCOUNTER — Encounter (HOSPITAL_COMMUNITY): Payer: 59

## 2018-02-03 DIAGNOSIS — Z951 Presence of aortocoronary bypass graft: Secondary | ICD-10-CM

## 2018-02-03 DIAGNOSIS — I214 Non-ST elevation (NSTEMI) myocardial infarction: Secondary | ICD-10-CM | POA: Diagnosis not present

## 2018-02-06 ENCOUNTER — Encounter (HOSPITAL_COMMUNITY)
Admission: RE | Admit: 2018-02-06 | Discharge: 2018-02-06 | Disposition: A | Discharge status: Home / Self Care | Payer: 59 | Source: Ambulatory Visit | Attending: Cardiovascular Disease | Admitting: Cardiovascular Disease

## 2018-02-06 ENCOUNTER — Encounter (HOSPITAL_COMMUNITY): Payer: 59

## 2018-02-06 DIAGNOSIS — Z951 Presence of aortocoronary bypass graft: Secondary | ICD-10-CM

## 2018-02-06 DIAGNOSIS — I214 Non-ST elevation (NSTEMI) myocardial infarction: Secondary | ICD-10-CM

## 2018-02-06 NOTE — Progress Notes (Signed)
I have reviewed a Home Exercise Prescription with Dan Lester . Dan Lester is not currently exercising at home. The patient was advised to walk/bike 2 days a week for 46minutes.  Dan Lester and I discussed how to progress their exercise prescription. The patient stated that they understand the exercise prescription. We reviewed exercise guidelines, target heart rate during exercise, RPE Scale, weather conditions, NTG use, endpoints for exercise, warmup and cool down. Patient is encouraged to come to me with any questions. I will continue to follow up with the patient to assist them with progression and safety.    Dan Lair MS, ACSM CEP 4:20 PM 02/03/2018

## 2018-02-08 ENCOUNTER — Encounter (HOSPITAL_COMMUNITY): Payer: 59

## 2018-02-08 ENCOUNTER — Encounter (HOSPITAL_COMMUNITY)
Admission: RE | Admit: 2018-02-08 | Discharge: 2018-02-08 | Disposition: A | Discharge status: Home / Self Care | Payer: 59 | Source: Ambulatory Visit | Attending: Cardiovascular Disease | Admitting: Cardiovascular Disease

## 2018-02-08 DIAGNOSIS — Z951 Presence of aortocoronary bypass graft: Secondary | ICD-10-CM

## 2018-02-08 DIAGNOSIS — I214 Non-ST elevation (NSTEMI) myocardial infarction: Secondary | ICD-10-CM

## 2018-02-08 NOTE — Progress Notes (Signed)
Dan Lester 72 y.o. male Nutrition Note Spoke with pt. Nutrition plan and goals reviewed with pt. Pt is following a heart healthy diet. Pt wants to maintain weight/ gain 10 lbs. Wt gain/ weight maintenance tips reviewed (label reading, how to build a healthy plate, portion sizes, eating frequently across the day). Pt is diabetic. Last A1c indicates blood glucose well-controlled. This Probation officer went over Diabetes Education test results. Pt shared he is comfortable managing his diabetes, his son was Type 1 diabetic and from this experience he was already comfortable meal planning and counting carbohydrates. Reviewed the difference between complex and refined carbohydrates, and confirmed pt understranding via teach back method. Pt continues to check CBG's 1-2 times a day. Fasting CBG's reportedly 120-130's mg/dL. Per discussion, pt does not use canned/convenience foods often. Pt rarely adds salt to food. Pt eats out infrequently. Pt expressed understanding of the information reviewed. Pt aware of nutrition education classes offered and plans on attending nutrition classes.  Lab Results  Component Value Date   HGBA1C 6.9 (H) 09/29/2017    Wt Readings from Last 3 Encounters:  01/10/18 169 lb 12.1 oz (77 kg)  12/28/17 170 lb (77.1 kg)  10/24/17 169 lb 11.2 oz (77 kg)    Nutrition Diagnosis  Food-and nutrition-related knowledge deficit related to lack of exposure to information as related to diagnosis of: ? CVD ? DM   Nutrition Intervention ? Pt's individual nutrition plan reviewed with pt. ? Benefits of adopting Heart Healthy diet discussed when [employer] reviewed.  Goal(s)   ? Pt to identify and limit food sources of saturated fat, trans fat, refined carbohydrates and sodium.  Plan:   Pt to attend nutrition classes ? Nutrition I ? Nutrition II ? Portion Distortion   Will provide client-centered nutrition education as part of interdisciplinary care  Monitor and evaluate progress toward  nutrition goal with team.    Laurina Bustle, MS, RD, LDN 02/08/2018 8:27 AM

## 2018-02-10 ENCOUNTER — Encounter (HOSPITAL_COMMUNITY): Payer: 59

## 2018-02-10 ENCOUNTER — Encounter (HOSPITAL_COMMUNITY)
Admission: RE | Admit: 2018-02-10 | Discharge: 2018-02-10 | Disposition: A | Discharge status: Home / Self Care | Payer: 59 | Source: Ambulatory Visit | Attending: Cardiovascular Disease | Admitting: Cardiovascular Disease

## 2018-02-10 DIAGNOSIS — I214 Non-ST elevation (NSTEMI) myocardial infarction: Secondary | ICD-10-CM | POA: Diagnosis not present

## 2018-02-10 DIAGNOSIS — Z951 Presence of aortocoronary bypass graft: Secondary | ICD-10-CM

## 2018-02-13 ENCOUNTER — Encounter (HOSPITAL_COMMUNITY): Payer: 59

## 2018-02-13 ENCOUNTER — Encounter (HOSPITAL_COMMUNITY)
Admission: RE | Admit: 2018-02-13 | Discharge: 2018-02-13 | Disposition: A | Discharge status: Home / Self Care | Payer: 59 | Source: Ambulatory Visit | Attending: Cardiovascular Disease | Admitting: Cardiovascular Disease

## 2018-02-13 DIAGNOSIS — Z951 Presence of aortocoronary bypass graft: Secondary | ICD-10-CM

## 2018-02-13 DIAGNOSIS — I214 Non-ST elevation (NSTEMI) myocardial infarction: Secondary | ICD-10-CM | POA: Diagnosis not present

## 2018-02-15 ENCOUNTER — Encounter (HOSPITAL_COMMUNITY): Payer: 59

## 2018-02-15 ENCOUNTER — Encounter (HOSPITAL_COMMUNITY)
Admission: RE | Admit: 2018-02-15 | Discharge: 2018-02-15 | Disposition: A | Discharge status: Home / Self Care | Payer: 59 | Source: Ambulatory Visit | Attending: Cardiovascular Disease | Admitting: Cardiovascular Disease

## 2018-02-15 DIAGNOSIS — I214 Non-ST elevation (NSTEMI) myocardial infarction: Secondary | ICD-10-CM

## 2018-02-15 DIAGNOSIS — Z951 Presence of aortocoronary bypass graft: Secondary | ICD-10-CM

## 2018-02-16 ENCOUNTER — Encounter (HOSPITAL_COMMUNITY): Payer: Self-pay

## 2018-02-17 ENCOUNTER — Encounter (HOSPITAL_COMMUNITY)
Admission: RE | Admit: 2018-02-17 | Discharge: 2018-02-17 | Disposition: A | Discharge status: Home / Self Care | Payer: 59 | Source: Ambulatory Visit | Attending: Cardiovascular Disease | Admitting: Cardiovascular Disease

## 2018-02-17 ENCOUNTER — Encounter (HOSPITAL_COMMUNITY): Payer: 59

## 2018-02-17 DIAGNOSIS — I214 Non-ST elevation (NSTEMI) myocardial infarction: Secondary | ICD-10-CM

## 2018-02-17 DIAGNOSIS — Z951 Presence of aortocoronary bypass graft: Secondary | ICD-10-CM

## 2018-02-20 ENCOUNTER — Encounter (HOSPITAL_COMMUNITY): Payer: 59

## 2018-02-20 ENCOUNTER — Encounter (HOSPITAL_COMMUNITY)
Admission: RE | Admit: 2018-02-20 | Discharge: 2018-02-20 | Disposition: A | Discharge status: Home / Self Care | Payer: 59 | Source: Ambulatory Visit | Attending: Cardiovascular Disease | Admitting: Cardiovascular Disease

## 2018-02-20 DIAGNOSIS — Z951 Presence of aortocoronary bypass graft: Secondary | ICD-10-CM

## 2018-02-20 DIAGNOSIS — I214 Non-ST elevation (NSTEMI) myocardial infarction: Secondary | ICD-10-CM

## 2018-02-21 DIAGNOSIS — R05 Cough: Secondary | ICD-10-CM | POA: Diagnosis not present

## 2018-02-21 DIAGNOSIS — J069 Acute upper respiratory infection, unspecified: Secondary | ICD-10-CM | POA: Diagnosis not present

## 2018-02-21 DIAGNOSIS — J209 Acute bronchitis, unspecified: Secondary | ICD-10-CM | POA: Diagnosis not present

## 2018-02-22 ENCOUNTER — Encounter (HOSPITAL_COMMUNITY): Payer: 59

## 2018-02-22 ENCOUNTER — Encounter (HOSPITAL_COMMUNITY)
Admission: RE | Admit: 2018-02-22 | Discharge: 2018-02-22 | Disposition: A | Discharge status: Home / Self Care | Payer: 59 | Source: Ambulatory Visit | Attending: Cardiovascular Disease | Admitting: Cardiovascular Disease

## 2018-02-22 DIAGNOSIS — Z951 Presence of aortocoronary bypass graft: Secondary | ICD-10-CM

## 2018-02-22 DIAGNOSIS — I214 Non-ST elevation (NSTEMI) myocardial infarction: Secondary | ICD-10-CM | POA: Diagnosis not present

## 2018-02-22 IMAGING — US US PELVIS LIMITED
1 series · 14 of 16 positions shown · non-contrast
Comparison: Limited correlation made with abdominopelvic CT
10/15/2013.

CLINICAL DATA: Chronic nonhealing right buttock wound with new
palpable area inferior to the wound.

EXAM:
LIMITED ULTRASOUND OF PELVIS
TECHNIQUE: Limited transabdominal ultrasound examination of the pelvis was
performed.

[Series 1: us pelvis limited · 0.04mm/px · 14 of 16 slices shown]
[im 1/16]
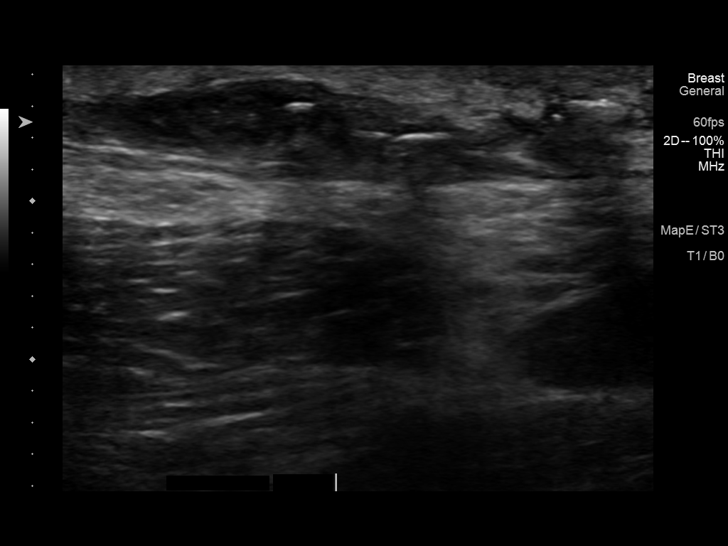
[im 2/16]
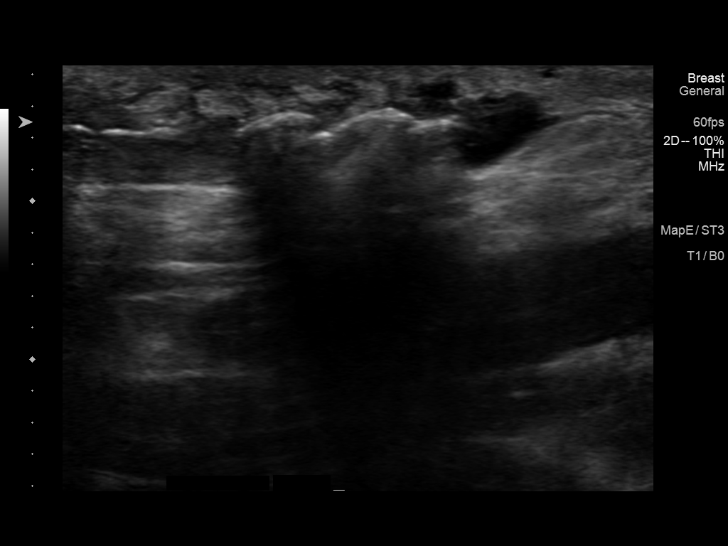
[im 3/16]
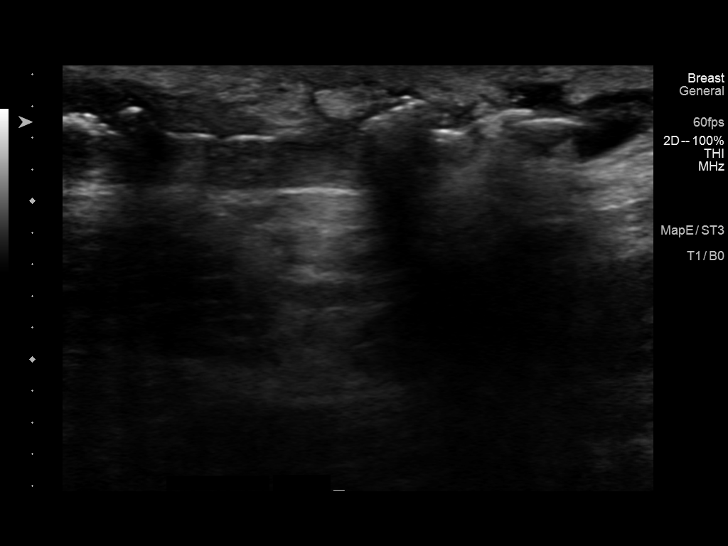
[im 5/16]
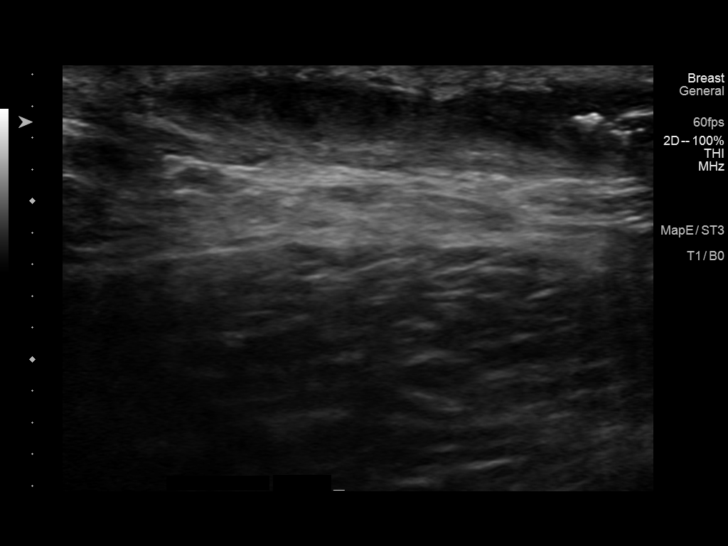
[im 6/16]
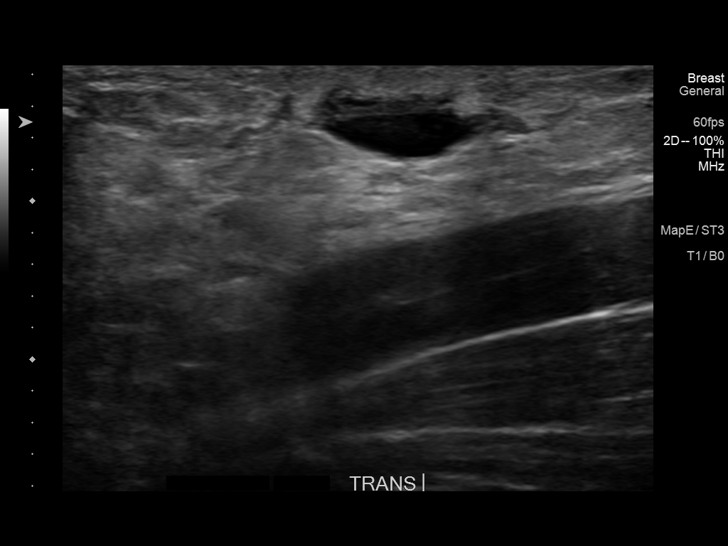
[im 7/16]
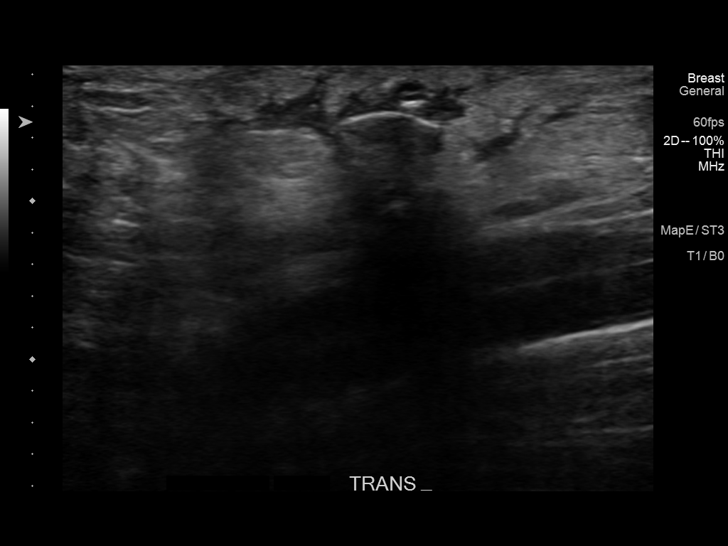
[im 8/16]
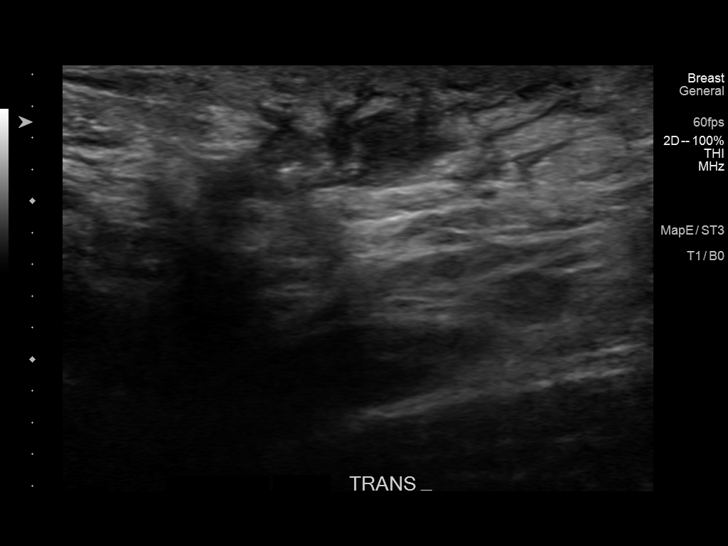
[im 9/16]
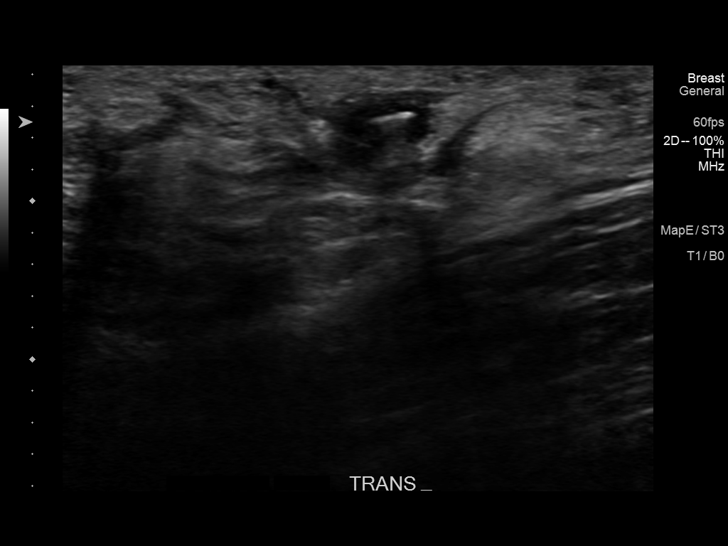
[im 10/16]
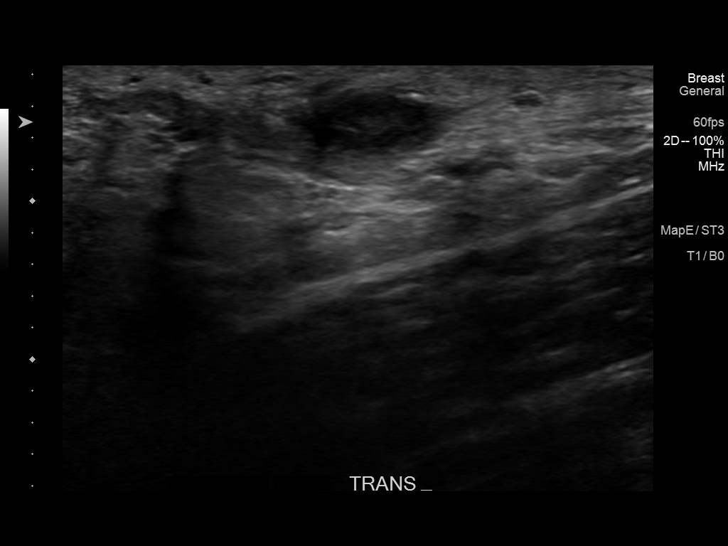
[im 11/16]
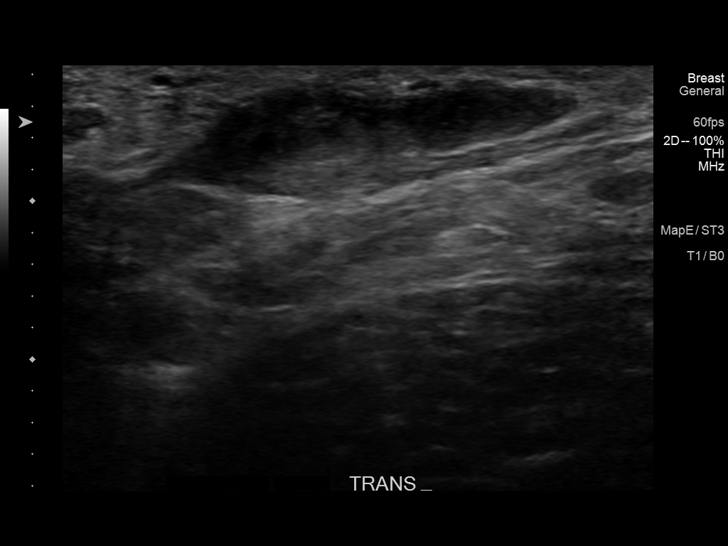
[im 13/16]
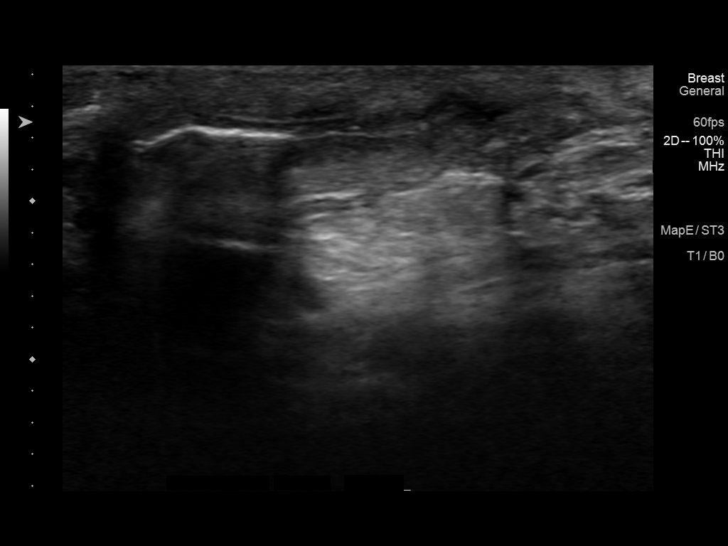
[im 14/16]
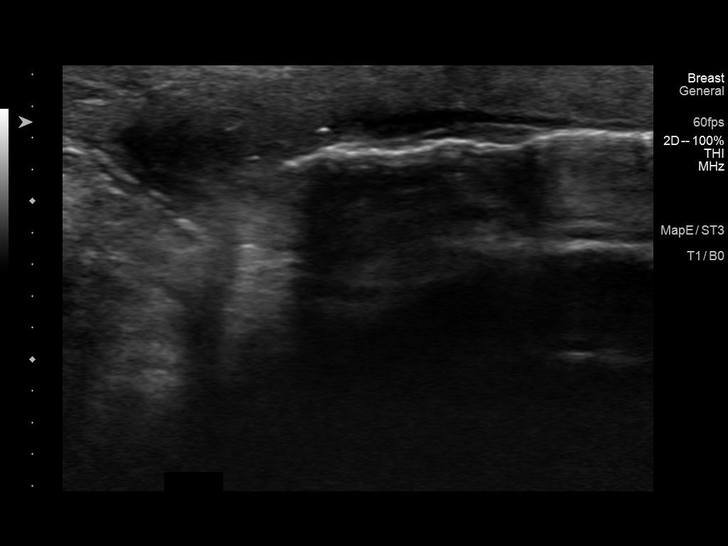
[im 15/16]
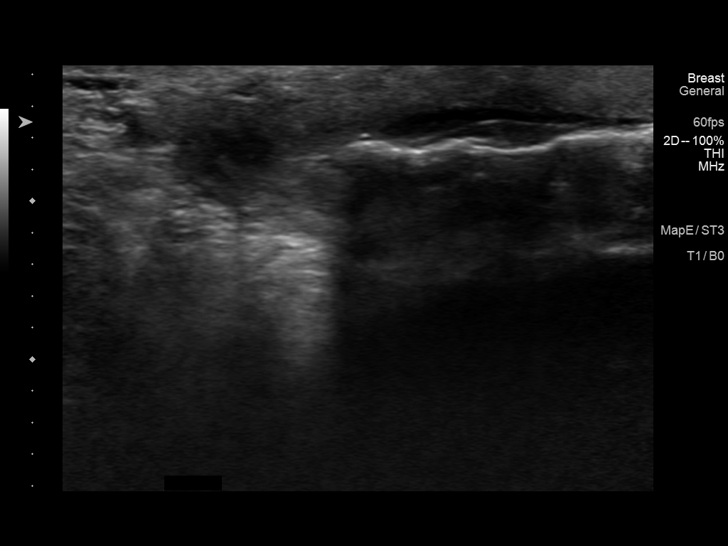
[im 16/16]
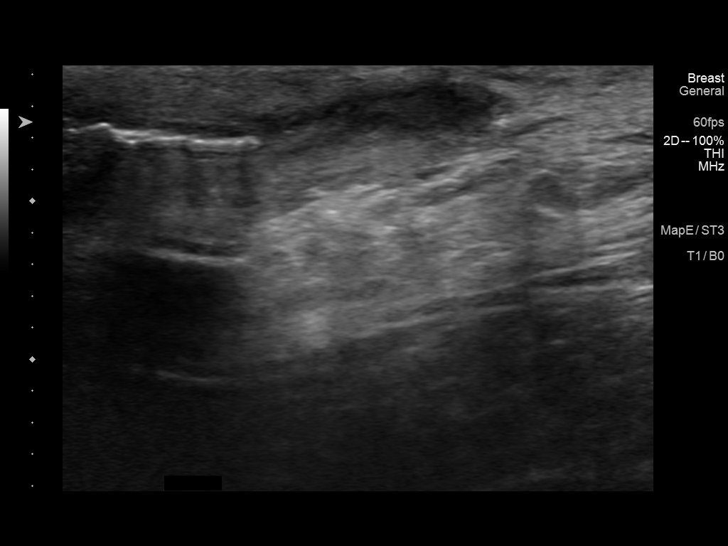

[14 of 16 positions shown; findings below may reference images not displayed]

FINDINGS: Examination was targeted to the right buttocks and posterior
proximal thigh. Extending inferiorly from the open wound, there is a
complex, tubular shaped fluid collection superficially in the
posterior proximal thigh. The epicenter of this is approximately 8
cm inferior to the wound. The largest component of this fluid
collection measures up to 2.7 cm in length. This fluid appears
complex with low level echoes and probable air.
IMPRESSION: Complex fluid collection corresponding with the patient's palpable
concern, inferior to the open wound. This is likely a superficial
abscess but does not appear easily drainable. CT with contrast may
be helpful for further evaluation.

These results will be called to the ordering clinician or
representative by the Radiologist Assistant, and communication
documented in the PACS or zVision Dashboard.

## 2018-02-23 NOTE — Progress Notes (Signed)
Cardiac Individual Treatment Plan  Patient Details  Name: Dan Lester MRN: [id number] Date of Birth: 1945/07/22 Referring Provider:     [place] from 01/10/2018 in [place]  Referring Provider  Nahser, Arnette Norris, MD      Initial Encounter Date:    CARDIAC REHAB PHASE II ORIENTATION from 01/10/2018 in [facility]  Date  01/10/18      Visit Diagnosis: 09/27/2017 NSTEMI (non-ST elevated myocardial infarction) ([place])  09/30/2017 S/P CABG (coronary artery bypass graft)  Patient's Home Medications on Admission:  Current Outpatient Medications:  .  aspirin EC 81 MG tablet, Take 1 tablet (81 mg total) by mouth daily., Disp: , Rfl:  .  atorvastatin (LIPITOR) 80 MG tablet, Take 1 tablet (80 mg total) by mouth daily at 6 PM., Disp: 90 tablet, Rfl: 3 .  buPROPion (WELLBUTRIN XL) 150 MG 24 hr tablet, Take 150 mg daily by mouth., Disp: , Rfl:  .  fluticasone (FLONASE) 50 MCG/ACT nasal spray, Place 2 sprays daily into both nostrils., Disp: , Rfl:  .  Fluticasone-Salmeterol (ADVAIR) 250-50 MCG/DOSE AEPB, Inhale 1 puff daily as needed into the lungs (shortness of breath). , Disp: , Rfl:  .  insulin glargine (LANTUS) 100 UNIT/ML injection, Inject 10 Units into the skin every evening. , Disp: , Rfl:  .  levothyroxine (SYNTHROID) 200 MCG tablet, Take 200 mcg every evening by mouth. , Disp: , Rfl:  .  MELATONIN PO, Take 1 tablet at bedtime as needed by mouth (sleep)., Disp: , Rfl:  .  metFORMIN (GLUCOPHAGE) 500 MG tablet, Take 500 mg 2 (two) times daily with a meal by mouth., Disp: , Rfl:  .  metoprolol tartrate (LOPRESSOR) 25 MG tablet, Take 1 tablet (25 mg total) by mouth 2 (two) times daily., Disp: 180 tablet, Rfl: 3 .  Multiple Vitamin (MULTIVITAMIN WITH MINERALS) TABS tablet, Take 1 tablet daily by mouth., Disp: , Rfl:  .  nabumetone (RELAFEN) 500 MG tablet, Take 500 mg by mouth See admin instructions., Disp:  , Rfl: 6 .  pantoprazole (PROTONIX) 40 MG tablet, Take 40 mg daily by mouth., Disp: , Rfl:  .  SUPER B COMPLEX/C CAPS, Take 1 tablet daily by mouth., Disp: , Rfl:  .  traMADol (ULTRAM) 50 MG tablet, Take 1 tablet (50 mg total) by mouth every 12 (twelve) hours as needed. (Patient not taking: Reported on 01/05/2018), Disp: 20 tablet, Rfl: 0  Past Medical History: Past Medical History:  Diagnosis Date  . Abscess of right thigh 11/06/2013  . Arthritis   . Asthma   . Cancer [facility])    prostate  . Chronic cholecystitis with calculus 10/19/2013  . Coronary artery disease 09/30/2017   NSTEMI 6/19 >> 3V CAD >> s/p CABG (LIMA-LAD, SVG-D1, SVG-OM/distal LCx and SVG-proximal PDA)  . Depression   . Diabetes mellitus without complication (HCC)    , diet controlled  . GERD (gastroesophageal reflux disease)   . H/O colonoscopy 03/12/2009  . Hernia, inguinal, left 12/17/2011  . History of kidney stones   . History of pressure ulcer on buttock 03/22/2017  . Hyperlipidemia   . Hypothyroidism   . Inguinal hernia   . Neck pain   . NSTEMI (non-ST elevated myocardial infarction) ([place])   . Postoperative atrial flutter (HCC)    Post CABG >> converted on Amiodarone  . Thyroid disease    hyperthyroidism; surgery changed him to hypo  . Type 2 diabetes mellitus with complication,  without long-term current use of insulin ([place]) 10/18/2017    Tobacco Use: Social History   Tobacco Use  Smoking Status Former Smoker  . Types: Cigars  Smokeless Tobacco Never Used    Labs: Recent Review Flowsheet Data    Labs for ITP Cardiac and Pulmonary Rehab Latest Ref Rng & Units 09/30/2017 09/30/2017 10/01/2017 10/01/2017 10/01/2017   Hemoglobin A1c 4.8 - 5.6 % - - - - -   PHART 7.350 - 7.450 7.325(L) - 7.358 7.360 -   PCO2ART 32.0 - 48.0 mmHg 41.0 - 42.4 38.3 -   HCO3 20.0 - 28.0 mmol/L 21.4 - 23.8 21.7 -   TCO2 22 - 32 mmol/L 23 22 25 23  21(L)   ACIDBASEDEF 0.0 - 2.0 mmol/L 4.0(H) - 2.0 3.0(H) -   O2SAT % 94.0 - 96.0 97.0 -       Capillary Blood Glucose: Lab Results  Component Value Date   GLUCAP 130 (H) 01/20/2018   GLUCAP 197 (H) 01/20/2018   GLUCAP 129 (H) 01/18/2018   GLUCAP 176 (H) 01/18/2018   GLUCAP 159 (H) 10/04/2017     Exercise Target Goals: Exercise Program Goal: Individual exercise prescription set using results from initial 6 min walk test and THRR while considering  patient's activity barriers and safety.   Exercise Prescription Goal: Initial exercise prescription builds to 30-45 minutes a day of aerobic activity, 2-3 days per week.  Home exercise guidelines will be given to patient during program as part of exercise prescription that the participant will acknowledge.  Activity Barriers & Risk Stratification: Activity Barriers & Cardiac Risk Stratification - 01/10/18 1018      Activity Barriers & Cardiac Risk Stratification   Activity Barriers  None    Cardiac Risk Stratification  High       6 Minute Walk: 6 Minute Walk    Row Name 01/10/18 0841         6 Minute Walk   Phase  Initial     Distance  1523 feet     Walk Time  6 minutes     # of Rest Breaks  0     MPH  2.88     METS  3.29     RPE  12     VO2 Peak  11.51     Symptoms  Yes (comment)     Comments  Patient c/o fatigue at end of walk test.     Resting HR  66 bpm     Resting BP  124/80     Resting Oxygen Saturation   98 %     Exercise Oxygen Saturation  during 6 min walk  97 %     Max Ex. HR  75 bpm     Max Ex. BP  118/82     2 Minute Post BP  124/78        Oxygen Initial Assessment:   Oxygen Re-Evaluation:   Oxygen Discharge (Final Oxygen Re-Evaluation):   Initial Exercise Prescription: Initial Exercise Prescription - 01/10/18 1000      Date of Initial Exercise RX and Referring Provider   Date  01/10/18    Referring Provider  Nahser, Arnette Norris, MD      Bike   Level  0.9    Minutes  10    METs  3.19      NuStep   Level  3    SPM  85    Minutes  10    METs  3  Track   Laps  12     Minutes  10    METs  3.09      Prescription Details   Frequency (times per week)  3    Duration  Progress to 30 minutes of continuous aerobic without signs/symptoms of physical distress      Intensity   THRR 40-80% of Max Heartrate  60-120    Ratings of Perceived Exertion  11-13    Perceived Dyspnea  0-4      Progression   Progression  Continue to progress workloads to maintain intensity without signs/symptoms of physical distress.      Resistance Training   Training Prescription  Yes    Weight  3lbs    Reps  10-15       Perform Capillary Blood Glucose checks as needed.  Exercise Prescription Changes: Exercise Prescription Changes    Row Name 01/18/18 0800 01/25/18 1600 02/06/18 1600 02/20/18 0800       Response to Exercise   Blood Pressure (Admit)  110/60  158/80  108/70  118/70    Blood Pressure (Exercise)  148/80  138/84  120/70  120/72    Blood Pressure (Exit)  108/70  106/88  110/60  122/70    Heart Rate (Admit)  69 bpm  74 bpm  74 bpm  70 bpm    Heart Rate (Exercise)  76 bpm  80 bpm  81 bpm  81 bpm    Heart Rate (Exit)  67 bpm  66 bpm  67 bpm  69 bpm    Rating of Perceived Exertion (Exercise)  11  12  13  12     Perceived Dyspnea (Exercise)  0  0  0  0    Symptoms  None   None  None  None    Comments  Pt oriented to exercise equipment   -  None  None    Duration  Progress to 30 minutes of  aerobic without signs/symptoms of physical distress  Continue with 30 min of aerobic exercise without signs/symptoms of physical distress.  Continue with 30 min of aerobic exercise without signs/symptoms of physical distress.  Progress to 45 minutes of aerobic exercise without signs/symptoms of physical distress    Intensity  THRR unchanged  THRR unchanged  THRR unchanged  THRR unchanged      Progression   Progression  Continue to progress workloads to maintain intensity without signs/symptoms of physical distress.  Continue to progress workloads to maintain intensity without  signs/symptoms of physical distress.  Continue to progress workloads to maintain intensity without signs/symptoms of physical distress.  Continue to progress workloads to maintain intensity without signs/symptoms of physical distress.    Average METs  3.1  3.28  3.19  3.58      Resistance Training   Training Prescription  No  No  Yes  Yes    Weight  -  -  3lbs  3lbs    Reps  -  -  10-15  10-15    Time  -  -  10 Minutes  10 Minutes      Interval Training   Interval Training  No  No  No  No      Bike   Level  1.1  1.1  1.1  1.1    Minutes  10  10  10  10     METs  3.7  3.67  3.7  3.69      NuStep   Level  3 Missed Average on NuStep  3  3  3     SPM  85  75  85  85    Minutes  10  10  10  10     METs  -  2.4  2.1  3.1      Track   Laps  12  16  16  16     Minutes  10  10  10  10     METs  3.09  3.76  3.78  3.78      Home Exercise Plan   Plans to continue exercise at  -  -  Home (comment)  Home (comment) Walking    Frequency  -  -  Add 2 additional days to program exercise sessions.  Add 2 additional days to program exercise sessions.    Initial Home Exercises Provided  -  -  02/03/18  02/03/18       Exercise Comments: Exercise Comments    Row Name 01/18/18 0810 02/03/18 1612         Exercise Comments  Pt's first day of exercise. Pt oriented to exercise equipment. Pt responded well to exercise workloads.   Reviewed HEP with pt. Will continue to follow up with pt regarding adherence to plan.          Exercise Goals and Review: Exercise Goals    Row Name 01/10/18 0900             Exercise Goals   Increase Physical Activity  Yes       Intervention  Provide advice, education, support and counseling about physical activity/exercise needs.;Develop an individualized exercise prescription for aerobic and resistive training based on initial evaluation findings, risk stratification, comorbidities and participant's personal goals.       Expected Outcomes  Short Term: Attend rehab  on a regular basis to increase amount of physical activity.;Long Term: Exercising regularly at least 3-5 days a week.;Long Term: Add in home exercise to make exercise part of routine and to increase amount of physical activity.       Increase Strength and Stamina  Yes       Intervention  Develop an individualized exercise prescription for aerobic and resistive training based on initial evaluation findings, risk stratification, comorbidities and participant's personal goals.;Provide advice, education, support and counseling about physical activity/exercise needs.       Expected Outcomes  Short Term: Increase workloads from initial exercise prescription for resistance, speed, and METs.;Short Term: Perform resistance training exercises routinely during rehab and add in resistance training at home;Long Term: Improve cardiorespiratory fitness, muscular endurance and strength as measured by increased METs and functional capacity (6MWT)       Able to understand and use rate of perceived exertion (RPE) scale  Yes       Intervention  Provide education and explanation on how to use RPE scale       Expected Outcomes  Short Term: Able to use RPE daily in rehab to express subjective intensity level;Long Term:  Able to use RPE to guide intensity level when exercising independently       Knowledge and understanding of Target Heart Rate Range (THRR)  Yes       Intervention  Provide education and explanation of THRR including how the numbers were predicted and where they are located for reference       Expected Outcomes  Short Term: Able to state/look up THRR;Long Term: Able to use THRR to govern intensity when exercising independently;Short  Term: Able to use daily as guideline for intensity in rehab       Able to check pulse independently  Yes       Intervention  Provide education and demonstration on how to check pulse in carotid and radial arteries.;Review the importance of being able to check your own pulse for safety  during independent exercise       Expected Outcomes  Short Term: Able to explain why pulse checking is important during independent exercise;Long Term: Able to check pulse independently and accurately       Understanding of Exercise Prescription  Yes       Intervention  Provide education, explanation, and written materials on patient's individual exercise prescription       Expected Outcomes  Short Term: Able to explain program exercise prescription;Long Term: Able to explain home exercise prescription to exercise independently          Exercise Goals Re-Evaluation : Exercise Goals Re-Evaluation    Row Name 02/03/18 1620 02/06/18 1613           Exercise Goal Re-Evaluation   Exercise Goals Review  Increase Physical Activity;Able to understand and use rate of perceived exertion (RPE) scale;Knowledge and understanding of Target Heart Rate Range (THRR);Understanding of Exercise Prescription;Increase Strength and Stamina;Able to check pulse independently  Increase Physical Activity;Able to understand and use rate of perceived exertion (RPE) scale;Knowledge and understanding of Target Heart Rate Range (THRR);Understanding of Exercise Prescription;Increase Strength and Stamina;Able to check pulse independently      Comments  Reviewed HEP with pt. Also reviewed RPE Scale, THRR, endpoints of exericse, weather conditions, NTG use, warmup and cool down.   Reviewed HEP with pt. Also reviewed RPE Scale, THRR, endpoints of exericse, weather conditions, NTG use, warmup and cool down.       Expected Outcomes  Pt will plan to walk or bike 2 days a week. Pt will start with 10 minutes and gradually increase to 30 minutes. Pt will continue to increase cardiorespiratory fitness. Will continue to monitor and continue.   Pt will plan to walk or bike 2 days a week. Pt will start with 10 minutes and gradually increase to 30 minutes. Pt will continue to increase cardiorespiratory fitness. Will continue to monitor and  continue.          Discharge Exercise Prescription (Final Exercise Prescription Changes): Exercise Prescription Changes - 02/20/18 0800      Response to Exercise   Blood Pressure (Admit)  118/70    Blood Pressure (Exercise)  120/72    Blood Pressure (Exit)  122/70    Heart Rate (Admit)  70 bpm    Heart Rate (Exercise)  81 bpm    Heart Rate (Exit)  69 bpm    Rating of Perceived Exertion (Exercise)  12    Perceived Dyspnea (Exercise)  0    Symptoms  None    Comments  None    Duration  Progress to 45 minutes of aerobic exercise without signs/symptoms of physical distress    Intensity  THRR unchanged      Progression   Progression  Continue to progress workloads to maintain intensity without signs/symptoms of physical distress.    Average METs  3.58      Resistance Training   Training Prescription  Yes    Weight  3lbs    Reps  10-15    Time  10 Minutes      Interval Training   Interval Training  No  Bike   Level  1.1    Minutes  10    METs  3.69      NuStep   Level  3    SPM  85    Minutes  10    METs  3.1      Track   Laps  16    Minutes  10    METs  3.78      Home Exercise Plan   Plans to continue exercise at  Home (comment)   Walking   Frequency  Add 2 additional days to program exercise sessions.    Initial Home Exercises Provided  02/03/18       Nutrition:  Target Goals: Understanding of nutrition guidelines, daily intake of sodium 1500mg , cholesterol 200mg , calories 30% from fat and 7% or less from saturated fats, daily to have 5 or more servings of fruits and vegetables.  Biometrics: Pre Biometrics - 01/10/18 0745      Pre Biometrics   Height  5\' 11"  (1.803 m)    Weight  77 kg    Waist Circumference  37 inches    Hip Circumference  39.5 inches    Waist to Hip Ratio  0.94 %    BMI (Calculated)  23.69    Triceps Skinfold  7 mm    % Body Fat  21.5 %    Grip Strength  37 kg    Flexibility  9.5 in    Single Leg Stand  15.03 seconds         Nutrition Therapy Plan and Nutrition Goals: Nutrition Therapy & Goals - 01/10/18 1357      Nutrition Therapy   Diet  heart healthy, carb modified      Personal Nutrition Goals   Nutrition Goal  Pt to identify and limit food sources of saturated fat, trans fat, refined carbohydrates and sodium      Intervention Plan   Intervention  Prescribe, educate and counsel regarding individualized specific dietary modifications aiming towards targeted core components such as weight, hypertension, lipid management, diabetes, heart failure and other comorbidities.    Expected Outcomes  Short Term Goal: Understand basic principles of dietary content, such as calories, fat, sodium, cholesterol and nutrients.;Long Term Goal: Adherence to prescribed nutrition plan.       Nutrition Assessments: Nutrition Assessments - 01/10/18 1402      MEDFICTS Scores   Pre Score  36       Nutrition Goals Re-Evaluation:   Nutrition Goals Re-Evaluation:   Nutrition Goals Discharge (Final Nutrition Goals Re-Evaluation):   Psychosocial: Target Goals: Acknowledge presence or absence of significant depression and/or stress, maximize coping skills, provide positive support system. Participant is able to verbalize types and ability to use techniques and skills needed for reducing stress and depression.  Initial Review & Psychosocial Screening: Initial Psych Review & Screening - 01/10/18 1159      Initial Review   Current issues with  None Identified      Family Dynamics   Good Support System?  Yes   family and friends      Barriers   Psychosocial barriers to participate in program  There are no identifiable barriers or psychosocial needs.      Screening Interventions   Interventions  Encouraged to exercise       Quality of Life Scores: Quality of Life - 01/10/18 1224      Quality of Life   Select  Quality of Life  Quality of Life Scores   Health/Function Pre  27.2 %    Socioeconomic  Pre  30 %    Psych/Spiritual Pre  30 %    Family Pre  30 %    GLOBAL Pre  28.8 %      Scores of 19 and below usually indicate a poorer quality of life in these areas.  A difference of  2-3 points is a clinically meaningful difference.  A difference of 2-3 points in the total score of the Quality of Life Index has been associated with significant improvement in overall quality of life, self-image, physical symptoms, and general health in studies assessing change in quality of life.  PHQ-9: Recent Review Flowsheet Data    Depression screen [facility] 2/9 01/18/2018   Decreased Interest 0   Down, Depressed, Hopeless 0   PHQ - 2 Score 0     Interpretation of Total Score  Total Score Depression Severity:  1-4 = Minimal depression, 5-9 = Mild depression, 10-14 = Moderate depression, 15-19 = Moderately severe depression, 20-27 = Severe depression   Psychosocial Evaluation and Intervention: Psychosocial Evaluation - 01/18/18 1456      Psychosocial Evaluation & Interventions   Interventions  Encouraged to exercise with the program and follow exercise prescription    Comments  No psychosocial needs identified.  Dan Lester enjoys learning about history and participating in battlefield tours and other outdoor activities.    Expected Outcomes  Dan Lester will continue to maintain a positive outlook.    Continue Psychosocial Services   No Follow up required       Psychosocial Re-Evaluation: Psychosocial Re-Evaluation    [place] Name 01/23/18 1633 02/16/18 1644           Psychosocial Re-Evaluation   Current issues with  None Identified  None Identified      Comments  No psychosocial interventions necessary.  No psychosocial interventions necessary.      Expected Outcomes  Dan Lester continues to maintain a positive outlook.   Dan Lester continues to maintain a positive outlook.       Interventions  Encouraged to attend Cardiac Rehabilitation for the exercise  Encouraged to attend Cardiac Rehabilitation for the  exercise;Relaxation education;Stress management education      Continue Psychosocial Services   No Follow up required  No Follow up required         Psychosocial Discharge (Final Psychosocial Re-Evaluation): Psychosocial Re-Evaluation - 02/16/18 1644      Psychosocial Re-Evaluation   Current issues with  None Identified    Comments  No psychosocial interventions necessary.    Expected Outcomes  Dan Lester continues to maintain a positive outlook.     Interventions  Encouraged to attend Cardiac Rehabilitation for the exercise;Relaxation education;Stress management education    Continue Psychosocial Services   No Follow up required       Vocational Rehabilitation: Provide vocational rehab assistance to qualifying candidates.   Vocational Rehab Evaluation & Intervention:   Education: Education Goals: Education classes will be provided on a weekly basis, covering required topics. Participant will state understanding/return demonstration of topics presented.  Learning Barriers/Preferences:   Education Topics: Count Your Pulse:  -Group instruction provided by verbal instruction, demonstration, patient participation and written materials to support subject.  Instructors address importance of being able to find your pulse and how to count your pulse when at home without a heart monitor.  Patients get hands on experience counting their pulse with staff help and individually.   Heart Attack, Angina, and Risk  Factor Modification:  -Group instruction provided by verbal instruction, video, and written materials to support subject.  Instructors address signs and symptoms of angina and heart attacks.    Also discuss risk factors for heart disease and how to make changes to improve heart health risk factors.   Functional Fitness:  -Group instruction provided by verbal instruction, demonstration, patient participation, and written materials to support subject.  Instructors address safety measures  for doing things around the house.  Discuss how to get up and down off the floor, how to pick things up properly, how to safely get out of a chair without assistance, and balance training.   Meditation and Mindfulness:  -Group instruction provided by verbal instruction, patient participation, and written materials to support subject.  Instructor addresses importance of mindfulness and meditation practice to help reduce stress and improve awareness.  Instructor also leads participants through a meditation exercise.    Stretching for Flexibility and Mobility:  -Group instruction provided by verbal instruction, patient participation, and written materials to support subject.  Instructors lead participants through series of stretches that are designed to increase flexibility thus improving mobility.  These stretches are additional exercise for major muscle groups that are typically performed during regular warm up and cool down.   Hands Only CPR:  -Group verbal, video, and participation provides a basic overview of AHA guidelines for community CPR. Role-play of emergencies allow participants the opportunity to practice calling for help and chest compression technique with discussion of AED use.   Hypertension: -Group verbal and written instruction that provides a basic overview of hypertension including the most recent diagnostic guidelines, risk factor reduction with self-care instructions and medication management.    Nutrition I class: Heart Healthy Eating:  -Group instruction provided by PowerPoint slides, verbal discussion, and written materials to support subject matter. The instructor gives an explanation and review of the Therapeutic Lifestyle Changes diet recommendations, which includes a discussion on lipid goals, dietary fat, sodium, fiber, plant stanol/sterol esters, sugar, and the components of a well-balanced, healthy diet.   Nutrition II class: Lifestyle Skills:  -Group  instruction provided by PowerPoint slides, verbal discussion, and written materials to support subject matter. The instructor gives an explanation and review of label reading, grocery shopping for heart health, heart healthy recipe modifications, and ways to make healthier choices when eating out.   Diabetes Question & Answer:  -Group instruction provided by PowerPoint slides, verbal discussion, and written materials to support subject matter. The instructor gives an explanation and review of diabetes co-morbidities, pre- and post-prandial blood glucose goals, pre-exercise blood glucose goals, signs, symptoms, and treatment of hypoglycemia and hyperglycemia, and foot care basics.   Diabetes Blitz:  -Group instruction provided by PowerPoint slides, verbal discussion, and written materials to support subject matter. The instructor gives an explanation and review of the physiology behind type 1 and type 2 diabetes, diabetes medications and rational behind using different medications, pre- and post-prandial blood glucose recommendations and Hemoglobin A1c goals, diabetes diet, and exercise including blood glucose guidelines for exercising safely.    Portion Distortion:  -Group instruction provided by PowerPoint slides, verbal discussion, written materials, and food models to support subject matter. The instructor gives an explanation of serving size versus portion size, changes in portions sizes over the last 20 years, and what consists of a serving from each food group.   Stress Management:  -Group instruction provided by verbal instruction, video, and written materials to support subject matter.  Instructors review role of stress  in heart disease and how to cope with stress positively.     Exercising on Your Own:  -Group instruction provided by verbal instruction, power point, and written materials to support subject.  Instructors discuss benefits of exercise, components of exercise, frequency and  intensity of exercise, and end points for exercise.  Also discuss use of nitroglycerin and activating EMS.  Review options of places to exercise outside of rehab.  Review guidelines for sex with heart disease.   Cardiac Drugs I:  -Group instruction provided by verbal instruction and written materials to support subject.  Instructor reviews cardiac drug classes: antiplatelets, anticoagulants, beta blockers, and statins.  Instructor discusses reasons, side effects, and lifestyle considerations for each drug class.   Cardiac Drugs II:  -Group instruction provided by verbal instruction and written materials to support subject.  Instructor reviews cardiac drug classes: angiotensin converting enzyme inhibitors (ACE-I), angiotensin II receptor blockers (ARBs), nitrates, and calcium channel blockers.  Instructor discusses reasons, side effects, and lifestyle considerations for each drug class.   Anatomy and Physiology of the Circulatory System:  Group verbal and written instruction and models provide basic cardiac anatomy and physiology, with the coronary electrical and arterial systems. Review of: AMI, Angina, Valve disease, Heart Failure, Peripheral Artery Disease, Cardiac Arrhythmia, Pacemakers, and the ICD.   Other Education:  -Group or individual verbal, written, or video instructions that support the educational goals of the cardiac rehab program.   Holiday Eating Survival Tips:  -Group instruction provided by PowerPoint slides, verbal discussion, and written materials to support subject matter. The instructor gives patients tips, tricks, and techniques to help them not only survive but enjoy the holidays despite the onslaught of food that accompanies the holidays.   Knowledge Questionnaire Score: Knowledge Questionnaire Score - 01/10/18 1159      Knowledge Questionnaire Score   Pre Score  22/24       Core Components/Risk Factors/Patient Goals at Admission: Personal Goals and Risk  Factors at Admission - 01/10/18 1022      Core Components/Risk Factors/Patient Goals on Admission   Diabetes  Yes    Intervention  Provide education about signs/symptoms and action to take for hypo/hyperglycemia.;Provide education about proper nutrition, including hydration, and aerobic/resistive exercise prescription along with prescribed medications to achieve blood glucose in normal ranges: Fasting glucose 65-99 mg/dL    Expected Outcomes  Short Term: Participant verbalizes understanding of the signs/symptoms and immediate care of hyper/hypoglycemia, proper foot care and importance of medication, aerobic/resistive exercise and nutrition plan for blood glucose control.;Long Term: Attainment of HbA1C < 7%.    Lipids  Yes    Intervention  Provide education and support for participant on nutrition & aerobic/resistive exercise along with prescribed medications to achieve LDL 70mg , HDL >40mg .    Expected Outcomes  Short Term: Participant states understanding of desired cholesterol values and is compliant with medications prescribed. Participant is following exercise prescription and nutrition guidelines.;Long Term: Cholesterol controlled with medications as prescribed, with individualized exercise RX and with personalized nutrition plan. Value goals: LDL < 70mg , HDL > 40 mg.    Stress  Yes    Intervention  Offer individual and/or small group education and counseling on adjustment to heart disease, stress management and health-related lifestyle change. Teach and support self-help strategies.;Refer participants experiencing significant psychosocial distress to appropriate mental health specialists for further evaluation and treatment. When possible, include family members and significant others in education/counseling sessions.    Expected Outcomes  Short Term: Participant demonstrates changes in health-related behavior,  relaxation and other stress management skills, ability to obtain effective social  support, and compliance with psychotropic medications if prescribed.;Long Term: Emotional wellbeing is indicated by absence of clinically significant psychosocial distress or social isolation.       Core Components/Risk Factors/Patient Goals Review:  Goals and Risk Factor Review    Row Name 01/18/18 1458 01/23/18 1633 02/16/18 1644         Core Components/Risk Factors/Patient Goals Review   Personal Goals Review  Lipids;Diabetes;Stress  Lipids;Diabetes;Stress  Lipids;Diabetes;Stress     Review  Pt with multiple CAD RFs willing to participate in CR exercise.  Dan Lester would like ot be able to feel better and restore his strength.  Pt with multiple CAD RFs willing to participate in CR exercise.  Dan Lester is off to a great start and is tolerating exercise well.  VSS.  Pt with multiple CAD RFs willing to participate in CR exercise.  Dan Lester continues to tolerate exercise well.  He feels that he is getting stronger. Encouraged patient to find time for exercising outside of CR.      Expected Outcomes  Pt will continue to participate in CR exercise, nutrition. and lifestyle modification opportunities.   Pt will continue to participate in CR exercise, nutrition. and lifestyle modification opportunities.   Pt will continue to participate in CR exercise, nutrition. and lifestyle modification opportunities.         Core Components/Risk Factors/Patient Goals at Discharge (Final Review):  Goals and Risk Factor Review - 02/16/18 1644      Core Components/Risk Factors/Patient Goals Review   Personal Goals Review  Lipids;Diabetes;Stress    Review  Pt with multiple CAD RFs willing to participate in CR exercise.  Dan Lester continues to tolerate exercise well.  He feels that he is getting stronger. Encouraged patient to find time for exercising outside of CR.     Expected Outcomes  Pt will continue to participate in CR exercise, nutrition. and lifestyle modification opportunities.        ITP Comments: ITP Comments     Row Name 01/10/18 1017 01/18/18 1632 02/16/18 1642       ITP Comments  Medical Director- Dr. Fransico Him, MD  30 Day ITP Review.  Pt started exercise today and tolerated it well.   30 Day ITP Reciew. Dan Lester enjoys coming to CR.  He feels that he is stronger.         Comments: See ITP Comments.

## 2018-02-24 ENCOUNTER — Encounter (HOSPITAL_COMMUNITY): Payer: 59

## 2018-02-24 ENCOUNTER — Encounter (HOSPITAL_COMMUNITY)
Admission: RE | Admit: 2018-02-24 | Discharge: 2018-02-24 | Disposition: A | Discharge status: Home / Self Care | Payer: 59 | Source: Ambulatory Visit | Attending: Cardiovascular Disease | Admitting: Cardiovascular Disease

## 2018-02-24 DIAGNOSIS — Z79890 Hormone replacement therapy (postmenopausal): Secondary | ICD-10-CM | POA: Insufficient documentation

## 2018-02-24 DIAGNOSIS — Z79899 Other long term (current) drug therapy: Secondary | ICD-10-CM | POA: Insufficient documentation

## 2018-02-24 DIAGNOSIS — E785 Hyperlipidemia, unspecified: Secondary | ICD-10-CM | POA: Diagnosis not present

## 2018-02-24 DIAGNOSIS — Z7982 Long term (current) use of aspirin: Secondary | ICD-10-CM | POA: Diagnosis not present

## 2018-02-24 DIAGNOSIS — F329 Major depressive disorder, single episode, unspecified: Secondary | ICD-10-CM | POA: Insufficient documentation

## 2018-02-24 DIAGNOSIS — I214 Non-ST elevation (NSTEMI) myocardial infarction: Secondary | ICD-10-CM | POA: Diagnosis not present

## 2018-02-24 DIAGNOSIS — Z87891 Personal history of nicotine dependence: Secondary | ICD-10-CM | POA: Diagnosis not present

## 2018-02-24 DIAGNOSIS — I2510 Atherosclerotic heart disease of native coronary artery without angina pectoris: Secondary | ICD-10-CM | POA: Diagnosis not present

## 2018-02-24 DIAGNOSIS — E119 Type 2 diabetes mellitus without complications: Secondary | ICD-10-CM | POA: Insufficient documentation

## 2018-02-24 DIAGNOSIS — E039 Hypothyroidism, unspecified: Secondary | ICD-10-CM | POA: Diagnosis not present

## 2018-02-24 DIAGNOSIS — Z794 Long term (current) use of insulin: Secondary | ICD-10-CM | POA: Insufficient documentation

## 2018-02-24 DIAGNOSIS — Z951 Presence of aortocoronary bypass graft: Secondary | ICD-10-CM | POA: Diagnosis not present

## 2018-02-24 DIAGNOSIS — K219 Gastro-esophageal reflux disease without esophagitis: Secondary | ICD-10-CM | POA: Diagnosis not present

## 2018-02-27 ENCOUNTER — Encounter (HOSPITAL_COMMUNITY): Payer: 59

## 2018-02-27 ENCOUNTER — Encounter (HOSPITAL_COMMUNITY)
Admission: RE | Admit: 2018-02-27 | Discharge: 2018-02-27 | Disposition: A | Discharge status: Home / Self Care | Payer: 59 | Source: Ambulatory Visit | Attending: Cardiovascular Disease | Admitting: Cardiovascular Disease

## 2018-02-27 DIAGNOSIS — I214 Non-ST elevation (NSTEMI) myocardial infarction: Secondary | ICD-10-CM | POA: Diagnosis not present

## 2018-02-27 DIAGNOSIS — Z951 Presence of aortocoronary bypass graft: Secondary | ICD-10-CM

## 2018-03-01 ENCOUNTER — Encounter (HOSPITAL_COMMUNITY)
Admission: RE | Admit: 2018-03-01 | Discharge: 2018-03-01 | Disposition: A | Discharge status: Home / Self Care | Payer: 59 | Source: Ambulatory Visit | Attending: Cardiovascular Disease | Admitting: Cardiovascular Disease

## 2018-03-01 ENCOUNTER — Encounter (HOSPITAL_COMMUNITY): Payer: 59

## 2018-03-01 DIAGNOSIS — I214 Non-ST elevation (NSTEMI) myocardial infarction: Secondary | ICD-10-CM

## 2018-03-01 DIAGNOSIS — Z951 Presence of aortocoronary bypass graft: Secondary | ICD-10-CM

## 2018-03-03 ENCOUNTER — Encounter (HOSPITAL_COMMUNITY): Payer: 59

## 2018-03-03 ENCOUNTER — Encounter (HOSPITAL_COMMUNITY)
Admission: RE | Admit: 2018-03-03 | Discharge: 2018-03-03 | Disposition: A | Discharge status: Home / Self Care | Payer: 59 | Source: Ambulatory Visit | Attending: Cardiovascular Disease | Admitting: Cardiovascular Disease

## 2018-03-03 DIAGNOSIS — I214 Non-ST elevation (NSTEMI) myocardial infarction: Secondary | ICD-10-CM | POA: Diagnosis not present

## 2018-03-03 DIAGNOSIS — Z951 Presence of aortocoronary bypass graft: Secondary | ICD-10-CM

## 2018-03-06 ENCOUNTER — Encounter (HOSPITAL_COMMUNITY)
Admission: RE | Admit: 2018-03-06 | Discharge: 2018-03-06 | Disposition: A | Discharge status: Home / Self Care | Payer: 59 | Source: Ambulatory Visit | Attending: Cardiovascular Disease | Admitting: Cardiovascular Disease

## 2018-03-06 ENCOUNTER — Encounter (HOSPITAL_COMMUNITY): Payer: 59

## 2018-03-06 DIAGNOSIS — Z951 Presence of aortocoronary bypass graft: Secondary | ICD-10-CM

## 2018-03-06 DIAGNOSIS — I214 Non-ST elevation (NSTEMI) myocardial infarction: Secondary | ICD-10-CM | POA: Diagnosis not present

## 2018-03-08 ENCOUNTER — Encounter (HOSPITAL_COMMUNITY): Payer: 59

## 2018-03-08 ENCOUNTER — Encounter (HOSPITAL_COMMUNITY)
Admission: RE | Admit: 2018-03-08 | Discharge: 2018-03-08 | Disposition: A | Discharge status: Home / Self Care | Payer: 59 | Source: Ambulatory Visit | Attending: Cardiovascular Disease | Admitting: Cardiovascular Disease

## 2018-03-08 DIAGNOSIS — I214 Non-ST elevation (NSTEMI) myocardial infarction: Secondary | ICD-10-CM | POA: Diagnosis not present

## 2018-03-08 DIAGNOSIS — Z951 Presence of aortocoronary bypass graft: Secondary | ICD-10-CM

## 2018-03-10 ENCOUNTER — Encounter (HOSPITAL_COMMUNITY): Payer: 59

## 2018-03-10 DIAGNOSIS — I2510 Atherosclerotic heart disease of native coronary artery without angina pectoris: Secondary | ICD-10-CM | POA: Diagnosis not present

## 2018-03-10 DIAGNOSIS — Z951 Presence of aortocoronary bypass graft: Secondary | ICD-10-CM | POA: Diagnosis not present

## 2018-03-10 DIAGNOSIS — I214 Non-ST elevation (NSTEMI) myocardial infarction: Secondary | ICD-10-CM | POA: Diagnosis not present

## 2018-03-13 ENCOUNTER — Encounter (HOSPITAL_COMMUNITY): Payer: Self-pay

## 2018-03-13 ENCOUNTER — Encounter (HOSPITAL_COMMUNITY): Payer: 59

## 2018-03-13 ENCOUNTER — Encounter (HOSPITAL_COMMUNITY)
Admission: RE | Admit: 2018-03-13 | Discharge: 2018-03-13 | Disposition: A | Discharge status: Home / Self Care | Payer: 59 | Source: Ambulatory Visit | Attending: Cardiovascular Disease | Admitting: Cardiovascular Disease

## 2018-03-13 DIAGNOSIS — Z951 Presence of aortocoronary bypass graft: Secondary | ICD-10-CM

## 2018-03-13 DIAGNOSIS — I214 Non-ST elevation (NSTEMI) myocardial infarction: Secondary | ICD-10-CM | POA: Diagnosis not present

## 2018-03-15 ENCOUNTER — Encounter (HOSPITAL_COMMUNITY): Payer: 59

## 2018-03-15 ENCOUNTER — Encounter (HOSPITAL_COMMUNITY)
Admission: RE | Admit: 2018-03-15 | Discharge: 2018-03-15 | Disposition: A | Discharge status: Home / Self Care | Payer: 59 | Source: Ambulatory Visit | Attending: Cardiovascular Disease | Admitting: Cardiovascular Disease

## 2018-03-15 DIAGNOSIS — Z951 Presence of aortocoronary bypass graft: Secondary | ICD-10-CM

## 2018-03-15 DIAGNOSIS — I214 Non-ST elevation (NSTEMI) myocardial infarction: Secondary | ICD-10-CM | POA: Diagnosis not present

## 2018-03-16 NOTE — Progress Notes (Signed)
Cardiac Individual Treatment Plan  Patient Details  Name: Ermon Sagan MRN: [id number] Date of Birth: Jul 24, 1945 Referring Provider:     [place] from 01/10/2018 in [facility]  Referring Provider  Nahser, Arnette Norris, MD      Initial Encounter Date:    CARDIAC REHAB PHASE II ORIENTATION from 01/10/2018 in [facility]  Date  01/10/18      Visit Diagnosis: 09/27/2017 NSTEMI (non-ST elevated myocardial infarction) ([place])  09/30/2017 S/P CABG (coronary artery bypass graft)  Patient's Home Medications on Admission:  Current Outpatient Medications:  .  aspirin EC 81 MG tablet, Take 1 tablet (81 mg total) by mouth daily., Disp: , Rfl:  .  atorvastatin (LIPITOR) 80 MG tablet, Take 1 tablet (80 mg total) by mouth daily at 6 PM., Disp: 90 tablet, Rfl: 3 .  buPROPion (WELLBUTRIN XL) 150 MG 24 hr tablet, Take 150 mg daily by mouth., Disp: , Rfl:  .  fluticasone (FLONASE) 50 MCG/ACT nasal spray, Place 2 sprays daily into both nostrils., Disp: , Rfl:  .  Fluticasone-Salmeterol (ADVAIR) 250-50 MCG/DOSE AEPB, Inhale 1 puff daily as needed into the lungs (shortness of breath). , Disp: , Rfl:  .  insulin glargine (LANTUS) 100 UNIT/ML injection, Inject 10 Units into the skin every evening. , Disp: , Rfl:  .  levothyroxine (SYNTHROID) 200 MCG tablet, Take 200 mcg every evening by mouth. , Disp: , Rfl:  .  MELATONIN PO, Take 1 tablet at bedtime as needed by mouth (sleep)., Disp: , Rfl:  .  metFORMIN (GLUCOPHAGE) 500 MG tablet, Take 500 mg 2 (two) times daily with a meal by mouth., Disp: , Rfl:  .  metoprolol tartrate (LOPRESSOR) 25 MG tablet, Take 1 tablet (25 mg total) by mouth 2 (two) times daily., Disp: 180 tablet, Rfl: 3 .  Multiple Vitamin (MULTIVITAMIN WITH MINERALS) TABS tablet, Take 1 tablet daily by mouth., Disp: , Rfl:  .  nabumetone (RELAFEN) 500 MG tablet, Take 500 mg by mouth See admin instructions., Disp:  , Rfl: 6 .  pantoprazole (PROTONIX) 40 MG tablet, Take 40 mg daily by mouth., Disp: , Rfl:  .  SUPER B COMPLEX/C CAPS, Take 1 tablet daily by mouth., Disp: , Rfl:  .  traMADol (ULTRAM) 50 MG tablet, Take 1 tablet (50 mg total) by mouth every 12 (twelve) hours as needed. (Patient not taking: Reported on 01/05/2018), Disp: 20 tablet, Rfl: 0  Past Medical History: Past Medical History:  Diagnosis Date  . Abscess of right thigh 11/06/2013  . Arthritis   . Asthma   . Cancer [facility])    prostate  . Chronic cholecystitis with calculus 10/19/2013  . Coronary artery disease 09/30/2017   NSTEMI 6/19 >> 3V CAD >> s/p CABG (LIMA-LAD, SVG-D1, SVG-OM/distal LCx and SVG-proximal PDA)  . Depression   . Diabetes mellitus without complication (HCC)    , diet controlled  . GERD (gastroesophageal reflux disease)   . H/O colonoscopy 03/12/2009  . Hernia, inguinal, left 12/17/2011  . History of kidney stones   . History of pressure ulcer on buttock 03/22/2017  . Hyperlipidemia   . Hypothyroidism   . Inguinal hernia   . Neck pain   . NSTEMI (non-ST elevated myocardial infarction) ([place])   . Postoperative atrial flutter (HCC)    Post CABG >> converted on Amiodarone  . Thyroid disease    hyperthyroidism; surgery changed him to hypo  . Type 2 diabetes mellitus with complication,  without long-term current use of insulin ([place]) 10/18/2017    Tobacco Use: Social History   Tobacco Use  Smoking Status Former Smoker  . Types: Cigars  Smokeless Tobacco Never Used    Labs: Recent Review Flowsheet Data    Labs for ITP Cardiac and Pulmonary Rehab Latest Ref Rng & Units 09/30/2017 09/30/2017 10/01/2017 10/01/2017 10/01/2017   Hemoglobin A1c 4.8 - 5.6 % - - - - -   PHART 7.350 - 7.450 7.325(L) - 7.358 7.360 -   PCO2ART 32.0 - 48.0 mmHg 41.0 - 42.4 38.3 -   HCO3 20.0 - 28.0 mmol/L 21.4 - 23.8 21.7 -   TCO2 22 - 32 mmol/L 23 22 25 23  21(L)   ACIDBASEDEF 0.0 - 2.0 mmol/L 4.0(H) - 2.0 3.0(H) -   O2SAT % 94.0 - 96.0 97.0 -       Capillary Blood Glucose: Lab Results  Component Value Date   GLUCAP 130 (H) 01/20/2018   GLUCAP 197 (H) 01/20/2018   GLUCAP 129 (H) 01/18/2018   GLUCAP 176 (H) 01/18/2018   GLUCAP 159 (H) 10/04/2017     Exercise Target Goals: Exercise Program Goal: Individual exercise prescription set using results from initial 6 min walk test and THRR while considering  patient's activity barriers and safety.   Exercise Prescription Goal: Initial exercise prescription builds to 30-45 minutes a day of aerobic activity, 2-3 days per week.  Home exercise guidelines will be given to patient during program as part of exercise prescription that the participant will acknowledge.  Activity Barriers & Risk Stratification: Activity Barriers & Cardiac Risk Stratification - 01/10/18 1018      Activity Barriers & Cardiac Risk Stratification   Activity Barriers  None    Cardiac Risk Stratification  High       6 Minute Walk: 6 Minute Walk    Row Name 01/10/18 0841         6 Minute Walk   Phase  Initial     Distance  1523 feet     Walk Time  6 minutes     # of Rest Breaks  0     MPH  2.88     METS  3.29     RPE  12     VO2 Peak  11.51     Symptoms  Yes (comment)     Comments  Patient c/o fatigue at end of walk test.     Resting HR  66 bpm     Resting BP  124/80     Resting Oxygen Saturation   98 %     Exercise Oxygen Saturation  during 6 min walk  97 %     Max Ex. HR  75 bpm     Max Ex. BP  118/82     2 Minute Post BP  124/78        Oxygen Initial Assessment:   Oxygen Re-Evaluation:   Oxygen Discharge (Final Oxygen Re-Evaluation):   Initial Exercise Prescription: Initial Exercise Prescription - 01/10/18 1000      Date of Initial Exercise RX and Referring Provider   Date  01/10/18    Referring Provider  Nahser, Arnette Norris, MD      Bike   Level  0.9    Minutes  10    METs  3.19      NuStep   Level  3    SPM  85    Minutes  10    METs  3  Track   Laps  12     Minutes  10    METs  3.09      Prescription Details   Frequency (times per week)  3    Duration  Progress to 30 minutes of continuous aerobic without signs/symptoms of physical distress      Intensity   THRR 40-80% of Max Heartrate  60-120    Ratings of Perceived Exertion  11-13    Perceived Dyspnea  0-4      Progression   Progression  Continue to progress workloads to maintain intensity without signs/symptoms of physical distress.      Resistance Training   Training Prescription  Yes    Weight  3lbs    Reps  10-15       Perform Capillary Blood Glucose checks as needed.  Exercise Prescription Changes: Exercise Prescription Changes    Row Name 01/18/18 0800 01/25/18 1600 02/06/18 1600 02/20/18 0800 03/06/18 1052     Response to Exercise   Blood Pressure (Admit)  110/60  158/80  108/70  118/70  122/68   Blood Pressure (Exercise)  148/80  138/84  120/70  120/72  130/70   Blood Pressure (Exit)  108/70  106/88  110/60  122/70  108/64   Heart Rate (Admit)  69 bpm  74 bpm  74 bpm  70 bpm  68 bpm   Heart Rate (Exercise)  76 bpm  80 bpm  81 bpm  81 bpm  81 bpm   Heart Rate (Exit)  67 bpm  66 bpm  67 bpm  69 bpm  68 bpm   Rating of Perceived Exertion (Exercise)  11  12  13  12  12    Perceived Dyspnea (Exercise)  0  0  0  0  0   Symptoms  None   None  None  None  None   Comments  Pt oriented to exercise equipment   -  None  None  None   Duration  Progress to 30 minutes of  aerobic without signs/symptoms of physical distress  Continue with 30 min of aerobic exercise without signs/symptoms of physical distress.  Continue with 30 min of aerobic exercise without signs/symptoms of physical distress.  Progress to 45 minutes of aerobic exercise without signs/symptoms of physical distress  Progress to 45 minutes of aerobic exercise without signs/symptoms of physical distress   Intensity  THRR unchanged  THRR unchanged  THRR unchanged  THRR unchanged  THRR unchanged     Progression    Progression  Continue to progress workloads to maintain intensity without signs/symptoms of physical distress.  Continue to progress workloads to maintain intensity without signs/symptoms of physical distress.  Continue to progress workloads to maintain intensity without signs/symptoms of physical distress.  Continue to progress workloads to maintain intensity without signs/symptoms of physical distress.  Continue to progress workloads to maintain intensity without signs/symptoms of physical distress.   Average METs  3.1  3.28  3.19  3.58  3.56     Resistance Training   Training Prescription  No  No  Yes  Yes  Yes   Weight  -  -  3lbs  3lbs  4lbs   Reps  -  -  10-15  10-15  10-15   Time  -  -  10 Minutes  10 Minutes  10 Minutes     Interval Training   Interval Training  No  No  No  No  No  Bike   Level  1.1  1.1  1.1  1.1  1.1   Minutes  10  10  10  10  10    METs  3.7  3.67  3.7  3.69  3.69     NuStep   Level  3 Missed Average on NuStep  3  3  3  3    SPM  85  75  85  85  85   Minutes  10  10  10  10  10    METs  -  2.4  2.1  3.1  3.2     Track   Laps  12  16  16  16  16    Minutes  10  10  10  10  10    METs  3.09  3.76  3.78  3.78  3.78     Home Exercise Plan   Plans to continue exercise at  -  -  Home (comment)  Home (comment) Walking  Home (comment) Walking   Frequency  -  -  Add 2 additional days to program exercise sessions.  Add 2 additional days to program exercise sessions.  Add 2 additional days to program exercise sessions.   Initial Home Exercises Provided  -  -  02/03/18  02/03/18  02/03/18      Exercise Comments: Exercise Comments    Row Name 01/18/18 0810 02/03/18 1612 03/13/18 0942       Exercise Comments  Pt's first day of exercise. Pt oriented to exercise equipment. Pt responded well to exercise workloads.   Reviewed HEP with pt. Will continue to follow up with pt regarding adherence to plan.   Reviewed METs and Goals with pt. Pt has not been successful with  exercising on his own. Will continue to work with pt to find solutions.         Exercise Goals and Review: Exercise Goals    Row Name 01/10/18 0900             Exercise Goals   Increase Physical Activity  Yes       Intervention  Provide advice, education, support and counseling about physical activity/exercise needs.;Develop an individualized exercise prescription for aerobic and resistive training based on initial evaluation findings, risk stratification, comorbidities and participant's personal goals.       Expected Outcomes  Short Term: Attend rehab on a regular basis to increase amount of physical activity.;Long Term: Exercising regularly at least 3-5 days a week.;Long Term: Add in home exercise to make exercise part of routine and to increase amount of physical activity.       Increase Strength and Stamina  Yes       Intervention  Develop an individualized exercise prescription for aerobic and resistive training based on initial evaluation findings, risk stratification, comorbidities and participant's personal goals.;Provide advice, education, support and counseling about physical activity/exercise needs.       Expected Outcomes  Short Term: Increase workloads from initial exercise prescription for resistance, speed, and METs.;Short Term: Perform resistance training exercises routinely during rehab and add in resistance training at home;Long Term: Improve cardiorespiratory fitness, muscular endurance and strength as measured by increased METs and functional capacity (6MWT)       Able to understand and use rate of perceived exertion (RPE) scale  Yes       Intervention  Provide education and explanation on how to use RPE scale       Expected Outcomes  Short Term: Able to  use RPE daily in rehab to express subjective intensity level;Long Term:  Able to use RPE to guide intensity level when exercising independently       Knowledge and understanding of Target Heart Rate Range (THRR)  Yes        Intervention  Provide education and explanation of THRR including how the numbers were predicted and where they are located for reference       Expected Outcomes  Short Term: Able to state/look up THRR;Long Term: Able to use THRR to govern intensity when exercising independently;Short Term: Able to use daily as guideline for intensity in rehab       Able to check pulse independently  Yes       Intervention  Provide education and demonstration on how to check pulse in carotid and radial arteries.;Review the importance of being able to check your own pulse for safety during independent exercise       Expected Outcomes  Short Term: Able to explain why pulse checking is important during independent exercise;Long Term: Able to check pulse independently and accurately       Understanding of Exercise Prescription  Yes       Intervention  Provide education, explanation, and written materials on patient's individual exercise prescription       Expected Outcomes  Short Term: Able to explain program exercise prescription;Long Term: Able to explain home exercise prescription to exercise independently          Exercise Goals Re-Evaluation : Exercise Goals Re-Evaluation    Row Name 02/03/18 1620 02/06/18 1613 03/13/18 0943 03/13/18 1018       Exercise Goal Re-Evaluation   Exercise Goals Review  Increase Physical Activity;Able to understand and use rate of perceived exertion (RPE) scale;Knowledge and understanding of Target Heart Rate Range (THRR);Understanding of Exercise Prescription;Increase Strength and Stamina;Able to check pulse independently  Increase Physical Activity;Able to understand and use rate of perceived exertion (RPE) scale;Knowledge and understanding of Target Heart Rate Range (THRR);Understanding of Exercise Prescription;Increase Strength and Stamina;Able to check pulse independently  Increase Physical Activity;Understanding of Exercise Prescription  (Pended)   -    Comments  Reviewed HEP with  pt. Also reviewed RPE Scale, THRR, endpoints of exericse, weather conditions, NTG use, warmup and cool down.   Reviewed HEP with pt. Also reviewed RPE Scale, THRR, endpoints of exericse, weather conditions, NTG use, warmup and cool down.   -  Reviewed METs and Goals with pt. Pt states he is starting to get his strength back. Will follow up with pt regarding increasing Nustep Level to 4 from 3. Pt conitnues to put forth great effort with exercise.     Expected Outcomes  Pt will plan to walk or bike 2 days a week. Pt will start with 10 minutes and gradually increase to 30 minutes. Pt will continue to increase cardiorespiratory fitness. Will continue to monitor and continue.   Pt will plan to walk or bike 2 days a week. Pt will start with 10 minutes and gradually increase to 30 minutes. Pt will continue to increase cardiorespiratory fitness. Will continue to monitor and continue.   -  Pt will try 2 days a week at work. Pt states he is tired after work and rarely takes breaks. Encouraged pt to make an effort to take breaks not only for his mental health, but to also walk for physical health.        Discharge Exercise Prescription (Final Exercise Prescription Changes): Exercise Prescription Changes - 03/06/18 1052  Response to Exercise   Blood Pressure (Admit)  122/68    Blood Pressure (Exercise)  130/70    Blood Pressure (Exit)  108/64    Heart Rate (Admit)  68 bpm    Heart Rate (Exercise)  81 bpm    Heart Rate (Exit)  68 bpm    Rating of Perceived Exertion (Exercise)  12    Perceived Dyspnea (Exercise)  0    Symptoms  None    Comments  None    Duration  Progress to 45 minutes of aerobic exercise without signs/symptoms of physical distress    Intensity  THRR unchanged      Progression   Progression  Continue to progress workloads to maintain intensity without signs/symptoms of physical distress.    Average METs  3.56      Resistance Training   Training Prescription  Yes    Weight  4lbs     Reps  10-15    Time  10 Minutes      Interval Training   Interval Training  No      Bike   Level  1.1    Minutes  10    METs  3.69      NuStep   Level  3    SPM  85    Minutes  10    METs  3.2      Track   Laps  16    Minutes  10    METs  3.78      Home Exercise Plan   Plans to continue exercise at  Home (comment)   Walking   Frequency  Add 2 additional days to program exercise sessions.    Initial Home Exercises Provided  02/03/18       Nutrition:  Target Goals: Understanding of nutrition guidelines, daily intake of sodium 1500mg , cholesterol 200mg , calories 30% from fat and 7% or less from saturated fats, daily to have 5 or more servings of fruits and vegetables.  Biometrics: Pre Biometrics - 01/10/18 0745      Pre Biometrics   Height  5\' 11"  (1.803 m)    Weight  77 kg    Waist Circumference  37 inches    Hip Circumference  39.5 inches    Waist to Hip Ratio  0.94 %    BMI (Calculated)  23.69    Triceps Skinfold  7 mm    % Body Fat  21.5 %    Grip Strength  37 kg    Flexibility  9.5 in    Single Leg Stand  15.03 seconds        Nutrition Therapy Plan and Nutrition Goals: Nutrition Therapy & Goals - 01/10/18 1357      Nutrition Therapy   Diet  heart healthy, carb modified      Personal Nutrition Goals   Nutrition Goal  Pt to identify and limit food sources of saturated fat, trans fat, refined carbohydrates and sodium      Intervention Plan   Intervention  Prescribe, educate and counsel regarding individualized specific dietary modifications aiming towards targeted core components such as weight, hypertension, lipid management, diabetes, heart failure and other comorbidities.    Expected Outcomes  Short Term Goal: Understand basic principles of dietary content, such as calories, fat, sodium, cholesterol and nutrients.;Long Term Goal: Adherence to prescribed nutrition plan.       Nutrition Assessments: Nutrition Assessments - 01/10/18 1402       MEDFICTS Scores   Pre  Score  36       Nutrition Goals Re-Evaluation:   Nutrition Goals Re-Evaluation:   Nutrition Goals Discharge (Final Nutrition Goals Re-Evaluation):   Psychosocial: Target Goals: Acknowledge presence or absence of significant depression and/or stress, maximize coping skills, provide positive support system. Participant is able to verbalize types and ability to use techniques and skills needed for reducing stress and depression.  Initial Review & Psychosocial Screening: Initial Psych Review & Screening - 01/10/18 1159      Initial Review   Current issues with  None Identified      Family Dynamics   Good Support System?  Yes   family and friends      Barriers   Psychosocial barriers to participate in program  There are no identifiable barriers or psychosocial needs.      Screening Interventions   Interventions  Encouraged to exercise       Quality of Life Scores: Quality of Life - 01/10/18 1224      Quality of Life   Select  Quality of Life      Quality of Life Scores   Health/Function Pre  27.2 %    Socioeconomic Pre  30 %    Psych/Spiritual Pre  30 %    Family Pre  30 %    GLOBAL Pre  28.8 %      Scores of 19 and below usually indicate a poorer quality of life in these areas.  A difference of  2-3 points is a clinically meaningful difference.  A difference of 2-3 points in the total score of the Quality of Life Index has been associated with significant improvement in overall quality of life, self-image, physical symptoms, and general health in studies assessing change in quality of life.  PHQ-9: Recent Review Flowsheet Data    Depression screen [facility] 2/9 01/18/2018   Decreased Interest 0   Down, Depressed, Hopeless 0   PHQ - 2 Score 0     Interpretation of Total Score  Total Score Depression Severity:  1-4 = Minimal depression, 5-9 = Mild depression, 10-14 = Moderate depression, 15-19 = Moderately severe depression, 20-27 = Severe  depression   Psychosocial Evaluation and Intervention: Psychosocial Evaluation - 01/18/18 1456      Psychosocial Evaluation & Interventions   Interventions  Encouraged to exercise with the program and follow exercise prescription    Comments  No psychosocial needs identified.  Laruth Bouchard enjoys learning about history and participating in battlefield tours and other outdoor activities.    Expected Outcomes  Laruth Bouchard will continue to maintain a positive outlook.    Continue Psychosocial Services   No Follow up required       Psychosocial Re-Evaluation: Psychosocial Re-Evaluation    [place] Name 01/23/18 1633 02/16/18 1644 03/13/18 1516         Psychosocial Re-Evaluation   Current issues with  None Identified  None Identified  None Identified     Comments  No psychosocial interventions necessary.  No psychosocial interventions necessary.  No psychosocial interventions necessary.     Expected Outcomes  Laruth Bouchard continues to maintain a positive outlook.   Laruth Bouchard continues to maintain a positive outlook.   Laruth Bouchard continues to maintain a positive outlook with good coping skills.      Interventions  Encouraged to attend Cardiac Rehabilitation for the exercise  Encouraged to attend Cardiac Rehabilitation for the exercise;Relaxation education;Stress management education  Encouraged to attend Cardiac Rehabilitation for the exercise;Relaxation education;Stress management education     Continue  Psychosocial Services   No Follow up required  No Follow up required  No Follow up required        Psychosocial Discharge (Final Psychosocial Re-Evaluation): Psychosocial Re-Evaluation - 03/13/18 1516      Psychosocial Re-Evaluation   Current issues with  None Identified    Comments  No psychosocial interventions necessary.    Expected Outcomes  Laruth Bouchard continues to maintain a positive outlook with good coping skills.     Interventions  Encouraged to attend Cardiac Rehabilitation for the exercise;Relaxation  education;Stress management education    Continue Psychosocial Services   No Follow up required       Vocational Rehabilitation: Provide vocational rehab assistance to qualifying candidates.   Vocational Rehab Evaluation & Intervention:   Education: Education Goals: Education classes will be provided on a weekly basis, covering required topics. Participant will state understanding/return demonstration of topics presented.  Learning Barriers/Preferences:   Education Topics: Count Your Pulse:  -Group instruction provided by verbal instruction, demonstration, patient participation and written materials to support subject.  Instructors address importance of being able to find your pulse and how to count your pulse when at home without a heart monitor.  Patients get hands on experience counting their pulse with staff help and individually.   Heart Attack, Angina, and Risk Factor Modification:  -Group instruction provided by verbal instruction, video, and written materials to support subject.  Instructors address signs and symptoms of angina and heart attacks.    Also discuss risk factors for heart disease and how to make changes to improve heart health risk factors.   Functional Fitness:  -Group instruction provided by verbal instruction, demonstration, patient participation, and written materials to support subject.  Instructors address safety measures for doing things around the house.  Discuss how to get up and down off the floor, how to pick things up properly, how to safely get out of a chair without assistance, and balance training.   Meditation and Mindfulness:  -Group instruction provided by verbal instruction, patient participation, and written materials to support subject.  Instructor addresses importance of mindfulness and meditation practice to help reduce stress and improve awareness.  Instructor also leads participants through a meditation exercise.    Stretching for  Flexibility and Mobility:  -Group instruction provided by verbal instruction, patient participation, and written materials to support subject.  Instructors lead participants through series of stretches that are designed to increase flexibility thus improving mobility.  These stretches are additional exercise for major muscle groups that are typically performed during regular warm up and cool down.   Hands Only CPR:  -Group verbal, video, and participation provides a basic overview of AHA guidelines for community CPR. Role-play of emergencies allow participants the opportunity to practice calling for help and chest compression technique with discussion of AED use.   Hypertension: -Group verbal and written instruction that provides a basic overview of hypertension including the most recent diagnostic guidelines, risk factor reduction with self-care instructions and medication management.    Nutrition I class: Heart Healthy Eating:  -Group instruction provided by PowerPoint slides, verbal discussion, and written materials to support subject matter. The instructor gives an explanation and review of the Therapeutic Lifestyle Changes diet recommendations, which includes a discussion on lipid goals, dietary fat, sodium, fiber, plant stanol/sterol esters, sugar, and the components of a well-balanced, healthy diet.   Nutrition II class: Lifestyle Skills:  -Group instruction provided by PowerPoint slides, verbal discussion, and written materials to support subject matter. The instructor gives  an explanation and review of label reading, grocery shopping for heart health, heart healthy recipe modifications, and ways to make healthier choices when eating out.   Diabetes Question & Answer:  -Group instruction provided by PowerPoint slides, verbal discussion, and written materials to support subject matter. The instructor gives an explanation and review of diabetes co-morbidities, pre- and post-prandial blood  glucose goals, pre-exercise blood glucose goals, signs, symptoms, and treatment of hypoglycemia and hyperglycemia, and foot care basics.   Diabetes Blitz:  -Group instruction provided by PowerPoint slides, verbal discussion, and written materials to support subject matter. The instructor gives an explanation and review of the physiology behind type 1 and type 2 diabetes, diabetes medications and rational behind using different medications, pre- and post-prandial blood glucose recommendations and Hemoglobin A1c goals, diabetes diet, and exercise including blood glucose guidelines for exercising safely.    Portion Distortion:  -Group instruction provided by PowerPoint slides, verbal discussion, written materials, and food models to support subject matter. The instructor gives an explanation of serving size versus portion size, changes in portions sizes over the last 20 years, and what consists of a serving from each food group.   Stress Management:  -Group instruction provided by verbal instruction, video, and written materials to support subject matter.  Instructors review role of stress in heart disease and how to cope with stress positively.     Exercising on Your Own:  -Group instruction provided by verbal instruction, power point, and written materials to support subject.  Instructors discuss benefits of exercise, components of exercise, frequency and intensity of exercise, and end points for exercise.  Also discuss use of nitroglycerin and activating EMS.  Review options of places to exercise outside of rehab.  Review guidelines for sex with heart disease.   Cardiac Drugs I:  -Group instruction provided by verbal instruction and written materials to support subject.  Instructor reviews cardiac drug classes: antiplatelets, anticoagulants, beta blockers, and statins.  Instructor discusses reasons, side effects, and lifestyle considerations for each drug class.   Cardiac Drugs II:  -Group  instruction provided by verbal instruction and written materials to support subject.  Instructor reviews cardiac drug classes: angiotensin converting enzyme inhibitors (ACE-I), angiotensin II receptor blockers (ARBs), nitrates, and calcium channel blockers.  Instructor discusses reasons, side effects, and lifestyle considerations for each drug class.   Anatomy and Physiology of the Circulatory System:  Group verbal and written instruction and models provide basic cardiac anatomy and physiology, with the coronary electrical and arterial systems. Review of: AMI, Angina, Valve disease, Heart Failure, Peripheral Artery Disease, Cardiac Arrhythmia, Pacemakers, and the ICD.   Other Education:  -Group or individual verbal, written, or video instructions that support the educational goals of the cardiac rehab program.   Holiday Eating Survival Tips:  -Group instruction provided by PowerPoint slides, verbal discussion, and written materials to support subject matter. The instructor gives patients tips, tricks, and techniques to help them not only survive but enjoy the holidays despite the onslaught of food that accompanies the holidays.   Knowledge Questionnaire Score: Knowledge Questionnaire Score - 01/10/18 1159      Knowledge Questionnaire Score   Pre Score  22/24       Core Components/Risk Factors/Patient Goals at Admission: Personal Goals and Risk Factors at Admission - 01/10/18 1022      Core Components/Risk Factors/Patient Goals on Admission   Diabetes  Yes    Intervention  Provide education about signs/symptoms and action to take for hypo/hyperglycemia.;Provide education about proper nutrition,  including hydration, and aerobic/resistive exercise prescription along with prescribed medications to achieve blood glucose in normal ranges: Fasting glucose 65-99 mg/dL    Expected Outcomes  Short Term: Participant verbalizes understanding of the signs/symptoms and immediate care of  hyper/hypoglycemia, proper foot care and importance of medication, aerobic/resistive exercise and nutrition plan for blood glucose control.;Long Term: Attainment of HbA1C < 7%.    Lipids  Yes    Intervention  Provide education and support for participant on nutrition & aerobic/resistive exercise along with prescribed medications to achieve LDL 70mg , HDL >40mg .    Expected Outcomes  Short Term: Participant states understanding of desired cholesterol values and is compliant with medications prescribed. Participant is following exercise prescription and nutrition guidelines.;Long Term: Cholesterol controlled with medications as prescribed, with individualized exercise RX and with personalized nutrition plan. Value goals: LDL < 70mg , HDL > 40 mg.    Stress  Yes    Intervention  Offer individual and/or small group education and counseling on adjustment to heart disease, stress management and health-related lifestyle change. Teach and support self-help strategies.;Refer participants experiencing significant psychosocial distress to appropriate mental health specialists for further evaluation and treatment. When possible, include family members and significant others in education/counseling sessions.    Expected Outcomes  Short Term: Participant demonstrates changes in health-related behavior, relaxation and other stress management skills, ability to obtain effective social support, and compliance with psychotropic medications if prescribed.;Long Term: Emotional wellbeing is indicated by absence of clinically significant psychosocial distress or social isolation.       Core Components/Risk Factors/Patient Goals Review:  Goals and Risk Factor Review    Row Name 01/18/18 1458 01/23/18 1633 02/16/18 1644 03/13/18 1517       Core Components/Risk Factors/Patient Goals Review   Personal Goals Review  Lipids;Diabetes;Stress  Lipids;Diabetes;Stress  Lipids;Diabetes;Stress  Lipids;Diabetes;Stress    Review  Pt  with multiple CAD RFs willing to participate in CR exercise.  Laruth Bouchard would like ot be able to feel better and restore his strength.  Pt with multiple CAD RFs willing to participate in CR exercise.  Laruth Bouchard is off to a great start and is tolerating exercise well.  VSS.  Pt with multiple CAD RFs willing to participate in CR exercise.  Laruth Bouchard continues to tolerate exercise well.  He feels that he is getting stronger. Encouraged patient to find time for exercising outside of CR.   Pt with multiple CAD RFs willing to participate in CR exercise.  Laruth Bouchard enjoys coming to CR.  He feels that he continues to gain strength.  Laruth Bouchard continues to work on exercising at home.     Expected Outcomes  Pt will continue to participate in CR exercise, nutrition. and lifestyle modification opportunities.   Pt will continue to participate in CR exercise, nutrition. and lifestyle modification opportunities.   Pt will continue to participate in CR exercise, nutrition. and lifestyle modification opportunities.   Pt will continue to participate in CR exercise, nutrition. and lifestyle modification opportunities.        Core Components/Risk Factors/Patient Goals at Discharge (Final Review):  Goals and Risk Factor Review - 03/13/18 1517      Core Components/Risk Factors/Patient Goals Review   Personal Goals Review  Lipids;Diabetes;Stress    Review  Pt with multiple CAD RFs willing to participate in CR exercise.  Laruth Bouchard enjoys coming to CR.  He feels that he continues to gain strength.  Laruth Bouchard continues to work on exercising at home.     Expected Outcomes  Pt will continue  to participate in CR exercise, nutrition. and lifestyle modification opportunities.        ITP Comments: ITP Comments    Row Name 01/10/18 1017 01/18/18 1632 02/16/18 1642 03/13/18 1516     ITP Comments  Medical Director- Dr. Fransico Him, MD  30 Day ITP Review.  Pt started exercise today and tolerated it well.   30 Day ITP Reciew. Laruth Bouchard enjoys coming to CR.   He feels that he is stronger.   30 Day ITP Review. Laruth Bouchard continues to enjoy CR and gain strength. He continues to work on exercising at home.        Comments: See ITP Comments.

## 2018-03-17 ENCOUNTER — Encounter (HOSPITAL_COMMUNITY)
Admission: RE | Admit: 2018-03-17 | Discharge: 2018-03-17 | Disposition: A | Discharge status: Home / Self Care | Payer: 59 | Source: Ambulatory Visit | Attending: Cardiovascular Disease | Admitting: Cardiovascular Disease

## 2018-03-17 ENCOUNTER — Encounter (HOSPITAL_COMMUNITY): Payer: 59

## 2018-03-17 DIAGNOSIS — I214 Non-ST elevation (NSTEMI) myocardial infarction: Secondary | ICD-10-CM

## 2018-03-17 DIAGNOSIS — Z951 Presence of aortocoronary bypass graft: Secondary | ICD-10-CM

## 2018-03-20 ENCOUNTER — Encounter (HOSPITAL_COMMUNITY)
Admission: RE | Admit: 2018-03-20 | Discharge: 2018-03-20 | Disposition: A | Discharge status: Home / Self Care | Payer: 59 | Source: Ambulatory Visit | Attending: Cardiovascular Disease | Admitting: Cardiovascular Disease

## 2018-03-20 ENCOUNTER — Encounter (HOSPITAL_COMMUNITY): Payer: 59

## 2018-03-20 DIAGNOSIS — I214 Non-ST elevation (NSTEMI) myocardial infarction: Secondary | ICD-10-CM | POA: Diagnosis not present

## 2018-03-20 DIAGNOSIS — Z951 Presence of aortocoronary bypass graft: Secondary | ICD-10-CM

## 2018-03-22 ENCOUNTER — Encounter (HOSPITAL_COMMUNITY): Payer: 59

## 2018-03-22 ENCOUNTER — Encounter (HOSPITAL_COMMUNITY)
Admission: RE | Admit: 2018-03-22 | Discharge: 2018-03-22 | Disposition: A | Discharge status: Home / Self Care | Payer: 59 | Source: Ambulatory Visit | Attending: Cardiovascular Disease | Admitting: Cardiovascular Disease

## 2018-03-22 DIAGNOSIS — Z951 Presence of aortocoronary bypass graft: Secondary | ICD-10-CM

## 2018-03-22 DIAGNOSIS — I214 Non-ST elevation (NSTEMI) myocardial infarction: Secondary | ICD-10-CM

## 2018-03-24 ENCOUNTER — Encounter (HOSPITAL_COMMUNITY): Payer: 59

## 2018-03-27 ENCOUNTER — Encounter (HOSPITAL_COMMUNITY): Payer: 59

## 2018-03-27 ENCOUNTER — Encounter (HOSPITAL_COMMUNITY)
Admission: RE | Admit: 2018-03-27 | Discharge: 2018-03-27 | Disposition: A | Discharge status: Home / Self Care | Payer: 59 | Source: Ambulatory Visit | Attending: Cardiovascular Disease | Admitting: Cardiovascular Disease

## 2018-03-27 VITALS — Ht 71.0 in | Wt 168.4 lb

## 2018-03-27 DIAGNOSIS — E039 Hypothyroidism, unspecified: Secondary | ICD-10-CM | POA: Insufficient documentation

## 2018-03-27 DIAGNOSIS — E785 Hyperlipidemia, unspecified: Secondary | ICD-10-CM | POA: Diagnosis not present

## 2018-03-27 DIAGNOSIS — I2510 Atherosclerotic heart disease of native coronary artery without angina pectoris: Secondary | ICD-10-CM | POA: Insufficient documentation

## 2018-03-27 DIAGNOSIS — Z79899 Other long term (current) drug therapy: Secondary | ICD-10-CM | POA: Insufficient documentation

## 2018-03-27 DIAGNOSIS — Z951 Presence of aortocoronary bypass graft: Secondary | ICD-10-CM | POA: Diagnosis not present

## 2018-03-27 DIAGNOSIS — Z79890 Hormone replacement therapy (postmenopausal): Secondary | ICD-10-CM | POA: Insufficient documentation

## 2018-03-27 DIAGNOSIS — F329 Major depressive disorder, single episode, unspecified: Secondary | ICD-10-CM | POA: Insufficient documentation

## 2018-03-27 DIAGNOSIS — K219 Gastro-esophageal reflux disease without esophagitis: Secondary | ICD-10-CM | POA: Insufficient documentation

## 2018-03-27 DIAGNOSIS — Z794 Long term (current) use of insulin: Secondary | ICD-10-CM | POA: Insufficient documentation

## 2018-03-27 DIAGNOSIS — E119 Type 2 diabetes mellitus without complications: Secondary | ICD-10-CM | POA: Insufficient documentation

## 2018-03-27 DIAGNOSIS — Z7982 Long term (current) use of aspirin: Secondary | ICD-10-CM | POA: Diagnosis not present

## 2018-03-27 DIAGNOSIS — I214 Non-ST elevation (NSTEMI) myocardial infarction: Secondary | ICD-10-CM | POA: Diagnosis not present

## 2018-03-27 DIAGNOSIS — Z87891 Personal history of nicotine dependence: Secondary | ICD-10-CM | POA: Diagnosis not present

## 2018-03-29 ENCOUNTER — Encounter (HOSPITAL_COMMUNITY): Payer: 59

## 2018-03-29 ENCOUNTER — Other Ambulatory Visit: Payer: 59

## 2018-03-29 ENCOUNTER — Encounter (HOSPITAL_COMMUNITY)
Admission: RE | Admit: 2018-03-29 | Discharge: 2018-03-29 | Disposition: A | Discharge status: Home / Self Care | Payer: 59 | Source: Ambulatory Visit | Attending: Cardiovascular Disease | Admitting: Cardiovascular Disease

## 2018-03-29 DIAGNOSIS — I214 Non-ST elevation (NSTEMI) myocardial infarction: Secondary | ICD-10-CM | POA: Diagnosis not present

## 2018-03-29 DIAGNOSIS — Z951 Presence of aortocoronary bypass graft: Secondary | ICD-10-CM

## 2018-03-30 ENCOUNTER — Other Ambulatory Visit: Payer: 59 | Admitting: *Deleted

## 2018-03-30 DIAGNOSIS — E785 Hyperlipidemia, unspecified: Secondary | ICD-10-CM | POA: Diagnosis not present

## 2018-03-31 ENCOUNTER — Encounter (HOSPITAL_COMMUNITY)
Admission: RE | Admit: 2018-03-31 | Discharge: 2018-03-31 | Disposition: A | Discharge status: Home / Self Care | Payer: 59 | Source: Ambulatory Visit | Attending: Cardiovascular Disease | Admitting: Cardiovascular Disease

## 2018-03-31 ENCOUNTER — Encounter (HOSPITAL_COMMUNITY): Payer: 59

## 2018-03-31 DIAGNOSIS — I214 Non-ST elevation (NSTEMI) myocardial infarction: Secondary | ICD-10-CM | POA: Diagnosis not present

## 2018-03-31 DIAGNOSIS — Z951 Presence of aortocoronary bypass graft: Secondary | ICD-10-CM

## 2018-03-31 LAB — HEPATIC FUNCTION PANEL
ALBUMIN: 4.3 g/dL (ref 3.5–4.8)
ALK PHOS: 125 IU/L — AB (ref 39–117)
ALT: 25 IU/L (ref 0–44)
AST: 21 IU/L (ref 0–40)
BILIRUBIN TOTAL: 0.3 mg/dL (ref 0.0–1.2)
BILIRUBIN, DIRECT: 0.12 mg/dL (ref 0.00–0.40)
TOTAL PROTEIN: 6.2 g/dL (ref 6.0–8.5)

## 2018-03-31 LAB — LIPID PANEL
Chol/HDL Ratio: 2.5 ratio (ref 0.0–5.0)
Cholesterol, Total: 118 mg/dL (ref 100–199)
HDL: 48 mg/dL (ref >39)
LDL Calculated: 52 mg/dL (ref 0–99)
Triglycerides: 92 mg/dL (ref 0–149)
VLDL Cholesterol Cal: 18 mg/dL (ref 5–40)

## 2018-04-03 ENCOUNTER — Encounter (HOSPITAL_COMMUNITY): Payer: 59

## 2018-04-03 ENCOUNTER — Encounter (HOSPITAL_COMMUNITY)
Admission: RE | Admit: 2018-04-03 | Discharge: 2018-04-03 | Disposition: A | Discharge status: Home / Self Care | Payer: 59 | Source: Ambulatory Visit | Attending: Cardiovascular Disease | Admitting: Cardiovascular Disease

## 2018-04-03 DIAGNOSIS — I214 Non-ST elevation (NSTEMI) myocardial infarction: Secondary | ICD-10-CM | POA: Diagnosis not present

## 2018-04-03 DIAGNOSIS — Z951 Presence of aortocoronary bypass graft: Secondary | ICD-10-CM

## 2018-04-05 ENCOUNTER — Encounter (HOSPITAL_COMMUNITY): Payer: Self-pay

## 2018-04-05 ENCOUNTER — Encounter (HOSPITAL_COMMUNITY): Payer: 59

## 2018-04-05 ENCOUNTER — Encounter (HOSPITAL_COMMUNITY)
Admission: RE | Admit: 2018-04-05 | Discharge: 2018-04-05 | Disposition: A | Discharge status: Home / Self Care | Payer: 59 | Source: Ambulatory Visit | Attending: Cardiovascular Disease | Admitting: Cardiovascular Disease

## 2018-04-05 DIAGNOSIS — Z951 Presence of aortocoronary bypass graft: Secondary | ICD-10-CM

## 2018-04-05 DIAGNOSIS — I214 Non-ST elevation (NSTEMI) myocardial infarction: Secondary | ICD-10-CM

## 2018-04-06 NOTE — Progress Notes (Signed)
Cardiac Individual Treatment Plan  Patient Details  Name: Dan Lester MRN: [id number] Date of Birth: 1945/07/22 Referring Provider:     [place] from 01/10/2018 in [place]  Referring Provider  Nahser, Arnette Norris, MD      Initial Encounter Date:    CARDIAC REHAB PHASE II ORIENTATION from 01/10/2018 in [place]  Date  01/10/18      Visit Diagnosis: 09/27/2017 NSTEMI (non-ST elevated myocardial infarction) ([place])  09/30/2017 S/P CABG (coronary artery bypass graft)  Patient's Home Medications on Admission:  Current Outpatient Medications:  .  aspirin EC 81 MG tablet, Take 1 tablet (81 mg total) by mouth daily., Disp: , Rfl:  .  atorvastatin (LIPITOR) 80 MG tablet, Take 1 tablet (80 mg total) by mouth daily at 6 PM., Disp: 90 tablet, Rfl: 3 .  buPROPion (WELLBUTRIN XL) 150 MG 24 hr tablet, Take 150 mg daily by mouth., Disp: , Rfl:  .  fluticasone (FLONASE) 50 MCG/ACT nasal spray, Place 2 sprays daily into both nostrils., Disp: , Rfl:  .  Fluticasone-Salmeterol (ADVAIR) 250-50 MCG/DOSE AEPB, Inhale 1 puff daily as needed into the lungs (shortness of breath). , Disp: , Rfl:  .  insulin glargine (LANTUS) 100 UNIT/ML injection, Inject 10 Units into the skin every evening. , Disp: , Rfl:  .  levothyroxine (SYNTHROID) 200 MCG tablet, Take 200 mcg every evening by mouth. , Disp: , Rfl:  .  MELATONIN PO, Take 1 tablet at bedtime as needed by mouth (sleep)., Disp: , Rfl:  .  metFORMIN (GLUCOPHAGE) 500 MG tablet, Take 500 mg 2 (two) times daily with a meal by mouth., Disp: , Rfl:  .  metoprolol tartrate (LOPRESSOR) 25 MG tablet, Take 1 tablet (25 mg total) by mouth 2 (two) times daily., Disp: 180 tablet, Rfl: 3 .  Multiple Vitamin (MULTIVITAMIN WITH MINERALS) TABS tablet, Take 1 tablet daily by mouth., Disp: , Rfl:  .  nabumetone (RELAFEN) 500 MG tablet, Take 500 mg by mouth See admin instructions., Disp:  , Rfl: 6 .  pantoprazole (PROTONIX) 40 MG tablet, Take 40 mg daily by mouth., Disp: , Rfl:  .  SUPER B COMPLEX/C CAPS, Take 1 tablet daily by mouth., Disp: , Rfl:  .  traMADol (ULTRAM) 50 MG tablet, Take 1 tablet (50 mg total) by mouth every 12 (twelve) hours as needed. (Patient not taking: Reported on 01/05/2018), Disp: 20 tablet, Rfl: 0  Past Medical History: Past Medical History:  Diagnosis Date  . Abscess of right thigh 11/06/2013  . Arthritis   . Asthma   . Cancer [facility])    prostate  . Chronic cholecystitis with calculus 10/19/2013  . Coronary artery disease 09/30/2017   NSTEMI 6/19 >> 3V CAD >> s/p CABG (LIMA-LAD, SVG-D1, SVG-OM/distal LCx and SVG-proximal PDA)  . Depression   . Diabetes mellitus without complication (HCC)    , diet controlled  . GERD (gastroesophageal reflux disease)   . H/O colonoscopy 03/12/2009  . Hernia, inguinal, left 12/17/2011  . History of kidney stones   . History of pressure ulcer on buttock 03/22/2017  . Hyperlipidemia   . Hypothyroidism   . Inguinal hernia   . Neck pain   . NSTEMI (non-ST elevated myocardial infarction) ([place])   . Postoperative atrial flutter (HCC)    Post CABG >> converted on Amiodarone  . Thyroid disease    hyperthyroidism; surgery changed him to hypo  . Type 2 diabetes mellitus with complication,  without long-term current use of insulin ([place]) 10/18/2017    Tobacco Use: Social History   Tobacco Use  Smoking Status Former Smoker  . Types: Cigars  Smokeless Tobacco Never Used    Labs: Recent Review Flowsheet Data    Labs for ITP Cardiac and Pulmonary Rehab Latest Ref Rng & Units 09/30/2017 10/01/2017 10/01/2017 10/01/2017 03/30/2018   Cholestrol 100 - 199 mg/dL - - - - 118   LDLCALC 0 - 99 mg/dL - - - - 52   HDL >39 mg/dL - - - - 48   Trlycerides 0 - 149 mg/dL - - - - 92   Hemoglobin A1c 4.8 - 5.6 % - - - - -   PHART 7.350 - 7.450 - 7.358 7.360 - -   PCO2ART 32.0 - 48.0 mmHg - 42.4 38.3 - -   HCO3 20.0 - 28.0 mmol/L - 23.8 21.7  - -   TCO2 22 - 32 mmol/L 22 25 23  21(L) -   ACIDBASEDEF 0.0 - 2.0 mmol/L - 2.0 3.0(H) - -   O2SAT % - 96.0 97.0 - -      Capillary Blood Glucose: Lab Results  Component Value Date   GLUCAP 130 (H) 01/20/2018   GLUCAP 197 (H) 01/20/2018   GLUCAP 129 (H) 01/18/2018   GLUCAP 176 (H) 01/18/2018   GLUCAP 159 (H) 10/04/2017     Exercise Target Goals: Exercise Program Goal: Individual exercise prescription set using results from initial 6 min walk test and THRR while considering  patient's activity barriers and safety.   Exercise Prescription Goal: Initial exercise prescription builds to 30-45 minutes a day of aerobic activity, 2-3 days per week.  Home exercise guidelines will be given to patient during program as part of exercise prescription that the participant will acknowledge.  Activity Barriers & Risk Stratification: Activity Barriers & Cardiac Risk Stratification - 01/10/18 1018      Activity Barriers & Cardiac Risk Stratification   Activity Barriers  None    Cardiac Risk Stratification  High       6 Minute Walk: 6 Minute Walk    Row Name 01/10/18 0841 03/27/18 1519       6 Minute Walk   Phase  Initial  Discharge    Distance  1523 feet  1800 feet    Distance % Change  -  18.19 %    Distance Feet Change  -  277 ft    Walk Time  6 minutes  6 minutes    # of Rest Breaks  0  0    MPH  2.88  3.41    METS  3.29  3.91    RPE  12  11    Perceived Dyspnea   -  0    VO2 Peak  11.51  13.67    Symptoms  Yes (comment)  Yes (comment)    Comments  Patient c/o fatigue at end of walk test.  Patient c/o leg fatigue at end of walk test.    Resting HR  66 bpm  70 bpm    Resting BP  124/80  108/64    Resting Oxygen Saturation   98 %  -    Exercise Oxygen Saturation  during 6 min walk  97 %  -    Max Ex. HR  75 bpm  81 bpm    Max Ex. BP  118/82  130/72    2 Minute Post BP  124/78  108/60  Oxygen Initial Assessment:   Oxygen Re-Evaluation:   Oxygen Discharge (Final  Oxygen Re-Evaluation):   Initial Exercise Prescription: Initial Exercise Prescription - 01/10/18 1000      Date of Initial Exercise RX and Referring Provider   Date  01/10/18    Referring Provider  Nahser, Arnette Norris, MD      Bike   Level  0.9    Minutes  10    METs  3.19      NuStep   Level  3    SPM  85    Minutes  10    METs  3      Track   Laps  12    Minutes  10    METs  3.09      Prescription Details   Frequency (times per week)  3    Duration  Progress to 30 minutes of continuous aerobic without signs/symptoms of physical distress      Intensity   THRR 40-80% of Max Heartrate  60-120    Ratings of Perceived Exertion  11-13    Perceived Dyspnea  0-4      Progression   Progression  Continue to progress workloads to maintain intensity without signs/symptoms of physical distress.      Resistance Training   Training Prescription  Yes    Weight  3lbs    Reps  10-15       Perform Capillary Blood Glucose checks as needed.  Exercise Prescription Changes: Exercise Prescription Changes    Row Name 01/18/18 0800 01/25/18 1600 02/06/18 1600 02/20/18 0800 03/06/18 1052     Response to Exercise   Blood Pressure (Admit)  110/60  158/80  108/70  118/70  122/68   Blood Pressure (Exercise)  148/80  138/84  120/70  120/72  130/70   Blood Pressure (Exit)  108/70  106/88  110/60  122/70  108/64   Heart Rate (Admit)  69 bpm  74 bpm  74 bpm  70 bpm  68 bpm   Heart Rate (Exercise)  76 bpm  80 bpm  81 bpm  81 bpm  81 bpm   Heart Rate (Exit)  67 bpm  66 bpm  67 bpm  69 bpm  68 bpm   Rating of Perceived Exertion (Exercise)  11  12  13  12  12    Perceived Dyspnea (Exercise)  0  0  0  0  0   Symptoms  None   None  None  None  None   Comments  Pt oriented to exercise equipment   -  None  None  None   Duration  Progress to 30 minutes of  aerobic without signs/symptoms of physical distress  Continue with 30 min of aerobic exercise without signs/symptoms of physical distress.  Continue  with 30 min of aerobic exercise without signs/symptoms of physical distress.  Progress to 45 minutes of aerobic exercise without signs/symptoms of physical distress  Progress to 45 minutes of aerobic exercise without signs/symptoms of physical distress   Intensity  THRR unchanged  THRR unchanged  THRR unchanged  THRR unchanged  THRR unchanged     Progression   Progression  Continue to progress workloads to maintain intensity without signs/symptoms of physical distress.  Continue to progress workloads to maintain intensity without signs/symptoms of physical distress.  Continue to progress workloads to maintain intensity without signs/symptoms of physical distress.  Continue to progress workloads to maintain intensity without signs/symptoms of physical distress.  Continue to progress  workloads to maintain intensity without signs/symptoms of physical distress.   Average METs  3.1  3.28  3.19  3.58  3.56     Resistance Training   Training Prescription  No  No  Yes  Yes  Yes   Weight  -  -  3lbs  3lbs  4lbs   Reps  -  -  10-15  10-15  10-15   Time  -  -  10 Minutes  10 Minutes  10 Minutes     Interval Training   Interval Training  No  No  No  No  No     Bike   Level  1.1  1.1  1.1  1.1  1.1   Minutes  10  10  10  10  10    METs  3.7  3.67  3.7  3.69  3.69     NuStep   Level  3 Missed Average on NuStep  3  3  3  3    SPM  85  75  85  85  85   Minutes  10  10  10  10  10    METs  -  2.4  2.1  3.1  3.2     Track   Laps  12  16  16  16  16    Minutes  10  10  10  10  10    METs  3.09  3.76  3.78  3.78  3.78     Home Exercise Plan   Plans to continue exercise at  -  -  Home (comment)  Home (comment) Walking  Home (comment) Walking   Frequency  -  -  Add 2 additional days to program exercise sessions.  Add 2 additional days to program exercise sessions.  Add 2 additional days to program exercise sessions.   Initial Home Exercises Provided  -  -  02/03/18  02/03/18  02/03/18   Row Name 03/22/18  0956 04/03/18 1000 04/05/18 1300         Response to Exercise   Blood Pressure (Admit)  108/62  130/78  132/60     Blood Pressure (Exercise)  132/60  124/68  128/64     Blood Pressure (Exit)  116/72  110/60  120/60     Heart Rate (Admit)  76 bpm  69 bpm  73 bpm     Heart Rate (Exercise)  92 bpm  89 bpm  87 bpm     Heart Rate (Exit)  74 bpm  69 bpm  73 bpm     Rating of Perceived Exertion (Exercise)  11  12  11      Perceived Dyspnea (Exercise)  0  0  0     Symptoms  None  None  None     Comments  None  None  None     Duration  Progress to 45 minutes of aerobic exercise without signs/symptoms of physical distress  Progress to 45 minutes of aerobic exercise without signs/symptoms of physical distress  Continue with 45 min of aerobic exercise without signs/symptoms of physical distress.     Intensity  THRR unchanged  THRR unchanged  THRR unchanged       Progression   Progression  Continue to progress workloads to maintain intensity without signs/symptoms of physical distress.  Continue to progress workloads to maintain intensity without signs/symptoms of physical distress.  Continue to progress workloads to maintain intensity without signs/symptoms of physical distress.  Average METs  3.5  3.48  3.54       Resistance Training   Training Prescription  Yes  Yes  No     Weight  4lbs  4lbs  -     Reps  10-15  10-15  -     Time  10 Minutes  10 Minutes  -       Interval Training   Interval Training  No  No  -       Bike   Level  1.1  1.1  1.1     Minutes  10  10  10      METs  3.74  3.67  3.67       NuStep   Level  3  3  4      SPM  85  95  100     Minutes  10  10  10      METs  2.8  2.8  3       Track   Laps  16  17  17      Minutes  10  10  10      METs  3.78  3.99  3.99       Home Exercise Plan   Plans to continue exercise at  Home (comment) Walking  Home (comment) Walking  Home (comment) Walking     Frequency  Add 2 additional days to program exercise sessions.  Add 2  additional days to program exercise sessions.  Add 2 additional days to program exercise sessions.     Initial Home Exercises Provided  02/03/18  02/03/18  02/03/18        Exercise Comments: Exercise Comments    Row Name 01/18/18 0810 02/03/18 1612 03/13/18 0942 04/05/18 1337     Exercise Comments  Pt's first day of exercise. Pt oriented to exercise equipment. Pt responded well to exercise workloads.   Reviewed HEP with pt. Will continue to follow up with pt regarding adherence to plan.   Reviewed METs and Goals with pt. Pt has not been successful with exercising on his own. Will continue to work with pt to find solutions.   Reviewed METs and Goals with pt. Spoke with pt regarding plans for exercise post Cardiac Rehab. Pt has 3 more sessions. Will continue to monitor.        Exercise Goals and Review: Exercise Goals    Row Name 01/10/18 0900             Exercise Goals   Increase Physical Activity  Yes       Intervention  Provide advice, education, support and counseling about physical activity/exercise needs.;Develop an individualized exercise prescription for aerobic and resistive training based on initial evaluation findings, risk stratification, comorbidities and participant's personal goals.       Expected Outcomes  Short Term: Attend rehab on a regular basis to increase amount of physical activity.;Long Term: Exercising regularly at least 3-5 days a week.;Long Term: Add in home exercise to make exercise part of routine and to increase amount of physical activity.       Increase Strength and Stamina  Yes       Intervention  Develop an individualized exercise prescription for aerobic and resistive training based on initial evaluation findings, risk stratification, comorbidities and participant's personal goals.;Provide advice, education, support and counseling about physical activity/exercise needs.       Expected Outcomes  Short Term: Increase workloads from initial exercise  prescription for resistance, speed, and  METs.;Short Term: Perform resistance training exercises routinely during rehab and add in resistance training at home;Long Term: Improve cardiorespiratory fitness, muscular endurance and strength as measured by increased METs and functional capacity (6MWT)       Able to understand and use rate of perceived exertion (RPE) scale  Yes       Intervention  Provide education and explanation on how to use RPE scale       Expected Outcomes  Short Term: Able to use RPE daily in rehab to express subjective intensity level;Long Term:  Able to use RPE to guide intensity level when exercising independently       Knowledge and understanding of Target Heart Rate Range (THRR)  Yes       Intervention  Provide education and explanation of THRR including how the numbers were predicted and where they are located for reference       Expected Outcomes  Short Term: Able to state/look up THRR;Long Term: Able to use THRR to govern intensity when exercising independently;Short Term: Able to use daily as guideline for intensity in rehab       Able to check pulse independently  Yes       Intervention  Provide education and demonstration on how to check pulse in carotid and radial arteries.;Review the importance of being able to check your own pulse for safety during independent exercise       Expected Outcomes  Short Term: Able to explain why pulse checking is important during independent exercise;Long Term: Able to check pulse independently and accurately       Understanding of Exercise Prescription  Yes       Intervention  Provide education, explanation, and written materials on patient's individual exercise prescription       Expected Outcomes  Short Term: Able to explain program exercise prescription;Long Term: Able to explain home exercise prescription to exercise independently          Exercise Goals Re-Evaluation : Exercise Goals Re-Evaluation    Row Name 02/03/18 1620 02/06/18  1613 03/13/18 0943 03/13/18 1018 04/05/18 1338     Exercise Goal Re-Evaluation   Exercise Goals Review  Increase Physical Activity;Able to understand and use rate of perceived exertion (RPE) scale;Knowledge and understanding of Target Heart Rate Range (THRR);Understanding of Exercise Prescription;Increase Strength and Stamina;Able to check pulse independently  Increase Physical Activity;Able to understand and use rate of perceived exertion (RPE) scale;Knowledge and understanding of Target Heart Rate Range (THRR);Understanding of Exercise Prescription;Increase Strength and Stamina;Able to check pulse independently  Increase Physical Activity;Understanding of Exercise Prescription  (Pended)   -  Increase Physical Activity;Understanding of Exercise Prescription   Comments  Reviewed HEP with pt. Also reviewed RPE Scale, THRR, endpoints of exericse, weather conditions, NTG use, warmup and cool down.   Reviewed HEP with pt. Also reviewed RPE Scale, THRR, endpoints of exericse, weather conditions, NTG use, warmup and cool down.   -  Reviewed METs and Goals with pt. Pt states he is starting to get his strength back. Will follow up with pt regarding increasing Nustep Level to 4 from 3. Pt conitnues to put forth great effort with exercise.   Reviewed METs and Goals. Pt is continuing to respond well to exercise workloads. Pt is now exercising at a level 5 on Nustep. Pt is putting forth great effort with exercise and is able to exercise 30 minutes with minimal problems.    Expected Outcomes  Pt will plan to walk or bike 2 days a  week. Pt will start with 10 minutes and gradually increase to 30 minutes. Pt will continue to increase cardiorespiratory fitness. Will continue to monitor and continue.   Pt will plan to walk or bike 2 days a week. Pt will start with 10 minutes and gradually increase to 30 minutes. Pt will continue to increase cardiorespiratory fitness. Will continue to monitor and continue.   -  Pt will try 2 days  a week at work. Pt states he is tired after work and rarely takes breaks. Encouraged pt to make an effort to take breaks not only for his mental health, but to also walk for physical health.   Pt will continue to walk 2 days a week 30 minutes. Pt's wife has bought pt a recumbent bike. Pt states he is really enjoying having that bike at home. Pt will increase cardiovascular strength. Will continue to monitor.       Discharge Exercise Prescription (Final Exercise Prescription Changes): Exercise Prescription Changes - 04/05/18 1300      Response to Exercise   Blood Pressure (Admit)  132/60    Blood Pressure (Exercise)  128/64    Blood Pressure (Exit)  120/60    Heart Rate (Admit)  73 bpm    Heart Rate (Exercise)  87 bpm    Heart Rate (Exit)  73 bpm    Rating of Perceived Exertion (Exercise)  11    Perceived Dyspnea (Exercise)  0    Symptoms  None    Comments  None    Duration  Continue with 45 min of aerobic exercise without signs/symptoms of physical distress.    Intensity  THRR unchanged      Progression   Progression  Continue to progress workloads to maintain intensity without signs/symptoms of physical distress.    Average METs  3.54      Resistance Training   Training Prescription  No      Bike   Level  1.1    Minutes  10    METs  3.67      NuStep   Level  4    SPM  100    Minutes  10    METs  3      Track   Laps  17    Minutes  10    METs  3.99      Home Exercise Plan   Plans to continue exercise at  Home (comment)   Walking   Frequency  Add 2 additional days to program exercise sessions.    Initial Home Exercises Provided  02/03/18       Nutrition:  Target Goals: Understanding of nutrition guidelines, daily intake of sodium 1500mg , cholesterol 200mg , calories 30% from fat and 7% or less from saturated fats, daily to have 5 or more servings of fruits and vegetables.  Biometrics: Pre Biometrics - 01/10/18 0745      Pre Biometrics   Height  5\' 11"  (1.803  m)    Weight  77 kg    Waist Circumference  37 inches    Hip Circumference  39.5 inches    Waist to Hip Ratio  0.94 %    BMI (Calculated)  23.69    Triceps Skinfold  7 mm    % Body Fat  21.5 %    Grip Strength  37 kg    Flexibility  9.5 in    Single Leg Stand  15.03 seconds      Post Biometrics - 03/27/18 1521  Post  Biometrics   Height  5\' 11"  (1.803 m)    Weight  76.4 kg    Waist Circumference  37 inches    Hip Circumference  39 inches    Waist to Hip Ratio  0.95 %    BMI (Calculated)  23.5    Triceps Skinfold  8 mm    % Body Fat  21.9 %    Grip Strength  32 kg    Flexibility  8 in    Single Leg Stand  6.15 seconds       Nutrition Therapy Plan and Nutrition Goals: Nutrition Therapy & Goals - 01/10/18 1357      Nutrition Therapy   Diet  heart healthy, carb modified      Personal Nutrition Goals   Nutrition Goal  Pt to identify and limit food sources of saturated fat, trans fat, refined carbohydrates and sodium      Intervention Plan   Intervention  Prescribe, educate and counsel regarding individualized specific dietary modifications aiming towards targeted core components such as weight, hypertension, lipid management, diabetes, heart failure and other comorbidities.    Expected Outcomes  Short Term Goal: Understand basic principles of dietary content, such as calories, fat, sodium, cholesterol and nutrients.;Long Term Goal: Adherence to prescribed nutrition plan.       Nutrition Assessments: Nutrition Assessments - 01/10/18 1402      MEDFICTS Scores   Pre Score  36       Nutrition Goals Re-Evaluation:   Nutrition Goals Re-Evaluation:   Nutrition Goals Discharge (Final Nutrition Goals Re-Evaluation):   Psychosocial: Target Goals: Acknowledge presence or absence of significant depression and/or stress, maximize coping skills, provide positive support system. Participant is able to verbalize types and ability to use techniques and skills needed for  reducing stress and depression.  Initial Review & Psychosocial Screening: Initial Psych Review & Screening - 01/10/18 1159      Initial Review   Current issues with  None Identified      Family Dynamics   Good Support System?  Yes   family and friends      Barriers   Psychosocial barriers to participate in program  There are no identifiable barriers or psychosocial needs.      Screening Interventions   Interventions  Encouraged to exercise       Quality of Life Scores: Quality of Life - 01/10/18 1224      Quality of Life   Select  Quality of Life      Quality of Life Scores   Health/Function Pre  27.2 %    Socioeconomic Pre  30 %    Psych/Spiritual Pre  30 %    Family Pre  30 %    GLOBAL Pre  28.8 %      Scores of 19 and below usually indicate a poorer quality of life in these areas.  A difference of  2-3 points is a clinically meaningful difference.  A difference of 2-3 points in the total score of the Quality of Life Index has been associated with significant improvement in overall quality of life, self-image, physical symptoms, and general health in studies assessing change in quality of life.  PHQ-9: Recent Review Flowsheet Data    Depression screen [facility] 2/9 01/18/2018   Decreased Interest 0   Down, Depressed, Hopeless 0   PHQ - 2 Score 0     Interpretation of Total Score  Total Score Depression Severity:  1-4 = Minimal depression, 5-9 =  Mild depression, 10-14 = Moderate depression, 15-19 = Moderately severe depression, 20-27 = Severe depression   Psychosocial Evaluation and Intervention: Psychosocial Evaluation - 01/18/18 1456      Psychosocial Evaluation & Interventions   Interventions  Encouraged to exercise with the program and follow exercise prescription    Comments  No psychosocial needs identified.  Dan Lester enjoys learning about history and participating in battlefield tours and other outdoor activities.    Expected Outcomes  Dan Lester will continue to  maintain a positive outlook.    Continue Psychosocial Services   No Follow up required       Psychosocial Re-Evaluation: Psychosocial Re-Evaluation    [place] Name 01/23/18 1633 02/16/18 1644 03/13/18 1516 04/05/18 1504       Psychosocial Re-Evaluation   Current issues with  None Identified  None Identified  None Identified  None Identified    Comments  No psychosocial interventions necessary.  No psychosocial interventions necessary.  No psychosocial interventions necessary.  No psychosocial interventions necessary.    Expected Outcomes  Dan Lester continues to maintain a positive outlook.   Dan Lester continues to maintain a positive outlook.   Dan Lester continues to maintain a positive outlook with good coping skills.   Dan Lester continues to maintain a positive outlook with good coping skills.     Interventions  Encouraged to attend Cardiac Rehabilitation for the exercise  Encouraged to attend Cardiac Rehabilitation for the exercise;Relaxation education;Stress management education  Encouraged to attend Cardiac Rehabilitation for the exercise;Relaxation education;Stress management education  Encouraged to attend Cardiac Rehabilitation for the exercise;Relaxation education;Stress management education    Continue Psychosocial Services   No Follow up required  No Follow up required  No Follow up required  No Follow up required       Psychosocial Discharge (Final Psychosocial Re-Evaluation): Psychosocial Re-Evaluation - 04/05/18 1504      Psychosocial Re-Evaluation   Current issues with  None Identified    Comments  No psychosocial interventions necessary.    Expected Outcomes  Dan Lester continues to maintain a positive outlook with good coping skills.     Interventions  Encouraged to attend Cardiac Rehabilitation for the exercise;Relaxation education;Stress management education    Continue Psychosocial Services   No Follow up required       Vocational Rehabilitation: Provide vocational rehab assistance to  qualifying candidates.   Vocational Rehab Evaluation & Intervention:   Education: Education Goals: Education classes will be provided on a weekly basis, covering required topics. Participant will state understanding/return demonstration of topics presented.  Learning Barriers/Preferences:   Education Topics: Count Your Pulse:  -Group instruction provided by verbal instruction, demonstration, patient participation and written materials to support subject.  Instructors address importance of being able to find your pulse and how to count your pulse when at home without a heart monitor.  Patients get hands on experience counting their pulse with staff help and individually.   Heart Attack, Angina, and Risk Factor Modification:  -Group instruction provided by verbal instruction, video, and written materials to support subject.  Instructors address signs and symptoms of angina and heart attacks.    Also discuss risk factors for heart disease and how to make changes to improve heart health risk factors.   Functional Fitness:  -Group instruction provided by verbal instruction, demonstration, patient participation, and written materials to support subject.  Instructors address safety measures for doing things around the house.  Discuss how to get up and down off the floor, how to pick things up properly, how to  safely get out of a chair without assistance, and balance training.   Meditation and Mindfulness:  -Group instruction provided by verbal instruction, patient participation, and written materials to support subject.  Instructor addresses importance of mindfulness and meditation practice to help reduce stress and improve awareness.  Instructor also leads participants through a meditation exercise.    Stretching for Flexibility and Mobility:  -Group instruction provided by verbal instruction, patient participation, and written materials to support subject.  Instructors lead participants  through series of stretches that are designed to increase flexibility thus improving mobility.  These stretches are additional exercise for major muscle groups that are typically performed during regular warm up and cool down.   Hands Only CPR:  -Group verbal, video, and participation provides a basic overview of AHA guidelines for community CPR. Role-play of emergencies allow participants the opportunity to practice calling for help and chest compression technique with discussion of AED use.   Hypertension: -Group verbal and written instruction that provides a basic overview of hypertension including the most recent diagnostic guidelines, risk factor reduction with self-care instructions and medication management.    Nutrition I class: Heart Healthy Eating:  -Group instruction provided by PowerPoint slides, verbal discussion, and written materials to support subject matter. The instructor gives an explanation and review of the Therapeutic Lifestyle Changes diet recommendations, which includes a discussion on lipid goals, dietary fat, sodium, fiber, plant stanol/sterol esters, sugar, and the components of a well-balanced, healthy diet.   Nutrition II class: Lifestyle Skills:  -Group instruction provided by PowerPoint slides, verbal discussion, and written materials to support subject matter. The instructor gives an explanation and review of label reading, grocery shopping for heart health, heart healthy recipe modifications, and ways to make healthier choices when eating out.   Diabetes Question & Answer:  -Group instruction provided by PowerPoint slides, verbal discussion, and written materials to support subject matter. The instructor gives an explanation and review of diabetes co-morbidities, pre- and post-prandial blood glucose goals, pre-exercise blood glucose goals, signs, symptoms, and treatment of hypoglycemia and hyperglycemia, and foot care basics.   Diabetes Blitz:  -Group  instruction provided by PowerPoint slides, verbal discussion, and written materials to support subject matter. The instructor gives an explanation and review of the physiology behind type 1 and type 2 diabetes, diabetes medications and rational behind using different medications, pre- and post-prandial blood glucose recommendations and Hemoglobin A1c goals, diabetes diet, and exercise including blood glucose guidelines for exercising safely.    Portion Distortion:  -Group instruction provided by PowerPoint slides, verbal discussion, written materials, and food models to support subject matter. The instructor gives an explanation of serving size versus portion size, changes in portions sizes over the last 20 years, and what consists of a serving from each food group.   Stress Management:  -Group instruction provided by verbal instruction, video, and written materials to support subject matter.  Instructors review role of stress in heart disease and how to cope with stress positively.     Exercising on Your Own:  -Group instruction provided by verbal instruction, power point, and written materials to support subject.  Instructors discuss benefits of exercise, components of exercise, frequency and intensity of exercise, and end points for exercise.  Also discuss use of nitroglycerin and activating EMS.  Review options of places to exercise outside of rehab.  Review guidelines for sex with heart disease.   Cardiac Drugs I:  -Group instruction provided by verbal instruction and written materials to support subject.  Instructor reviews cardiac drug classes: antiplatelets, anticoagulants, beta blockers, and statins.  Instructor discusses reasons, side effects, and lifestyle considerations for each drug class.   Cardiac Drugs II:  -Group instruction provided by verbal instruction and written materials to support subject.  Instructor reviews cardiac drug classes: angiotensin converting enzyme inhibitors  (ACE-I), angiotensin II receptor blockers (ARBs), nitrates, and calcium channel blockers.  Instructor discusses reasons, side effects, and lifestyle considerations for each drug class.   Anatomy and Physiology of the Circulatory System:  Group verbal and written instruction and models provide basic cardiac anatomy and physiology, with the coronary electrical and arterial systems. Review of: AMI, Angina, Valve disease, Heart Failure, Peripheral Artery Disease, Cardiac Arrhythmia, Pacemakers, and the ICD.   Other Education:  -Group or individual verbal, written, or video instructions that support the educational goals of the cardiac rehab program.   Holiday Eating Survival Tips:  -Group instruction provided by PowerPoint slides, verbal discussion, and written materials to support subject matter. The instructor gives patients tips, tricks, and techniques to help them not only survive but enjoy the holidays despite the onslaught of food that accompanies the holidays.   Knowledge Questionnaire Score: Knowledge Questionnaire Score - 01/10/18 1159      Knowledge Questionnaire Score   Pre Score  22/24       Core Components/Risk Factors/Patient Goals at Admission: Personal Goals and Risk Factors at Admission - 01/10/18 1022      Core Components/Risk Factors/Patient Goals on Admission   Diabetes  Yes    Intervention  Provide education about signs/symptoms and action to take for hypo/hyperglycemia.;Provide education about proper nutrition, including hydration, and aerobic/resistive exercise prescription along with prescribed medications to achieve blood glucose in normal ranges: Fasting glucose 65-99 mg/dL    Expected Outcomes  Short Term: Participant verbalizes understanding of the signs/symptoms and immediate care of hyper/hypoglycemia, proper foot care and importance of medication, aerobic/resistive exercise and nutrition plan for blood glucose control.;Long Term: Attainment of HbA1C < 7%.     Lipids  Yes    Intervention  Provide education and support for participant on nutrition & aerobic/resistive exercise along with prescribed medications to achieve LDL 70mg , HDL >40mg .    Expected Outcomes  Short Term: Participant states understanding of desired cholesterol values and is compliant with medications prescribed. Participant is following exercise prescription and nutrition guidelines.;Long Term: Cholesterol controlled with medications as prescribed, with individualized exercise RX and with personalized nutrition plan. Value goals: LDL < 70mg , HDL > 40 mg.    Stress  Yes    Intervention  Offer individual and/or small group education and counseling on adjustment to heart disease, stress management and health-related lifestyle change. Teach and support self-help strategies.;Refer participants experiencing significant psychosocial distress to appropriate mental health specialists for further evaluation and treatment. When possible, include family members and significant others in education/counseling sessions.    Expected Outcomes  Short Term: Participant demonstrates changes in health-related behavior, relaxation and other stress management skills, ability to obtain effective social support, and compliance with psychotropic medications if prescribed.;Long Term: Emotional wellbeing is indicated by absence of clinically significant psychosocial distress or social isolation.       Core Components/Risk Factors/Patient Goals Review:  Goals and Risk Factor Review    Row Name 01/18/18 1458 01/23/18 1633 02/16/18 1644 03/13/18 1517 04/05/18 1508     Core Components/Risk Factors/Patient Goals Review   Personal Goals Review  Lipids;Diabetes;Stress  Lipids;Diabetes;Stress  Lipids;Diabetes;Stress  Lipids;Diabetes;Stress  Lipids;Diabetes;Stress   Review  Pt with multiple CAD  RFs willing to participate in CR exercise.  Dan Lester would like ot be able to feel better and restore his strength.  Pt with multiple  CAD RFs willing to participate in CR exercise.  Dan Lester is off to a great start and is tolerating exercise well.  VSS.  Pt with multiple CAD RFs willing to participate in CR exercise.  Dan Lester continues to tolerate exercise well.  He feels that he is getting stronger. Encouraged patient to find time for exercising outside of CR.   Pt with multiple CAD RFs willing to participate in CR exercise.  Dan Lester enjoys coming to CR.  He feels that he continues to gain strength.  Dan Lester continues to work on exercising at home.   Pt with multiple CAD RFs willing to participate in CR.  Dan Lester continues to enjoy coming to CR.  He feels that he is getting stronger.  He has purchased a bike for home exercise and has been using it.    Expected Outcomes  Pt will continue to participate in CR exercise, nutrition. and lifestyle modification opportunities.   Pt will continue to participate in CR exercise, nutrition. and lifestyle modification opportunities.   Pt will continue to participate in CR exercise, nutrition. and lifestyle modification opportunities.   Pt will continue to participate in CR exercise, nutrition. and lifestyle modification opportunities.   Pt will continue to participate in CR exercise, nutrition. and lifestyle modification opportunities.       Core Components/Risk Factors/Patient Goals at Discharge (Final Review):  Goals and Risk Factor Review - 04/05/18 1508      Core Components/Risk Factors/Patient Goals Review   Personal Goals Review  Lipids;Diabetes;Stress    Review  Pt with multiple CAD RFs willing to participate in CR.  Dan Lester continues to enjoy coming to CR.  He feels that he is getting stronger.  He has purchased a bike for home exercise and has been using it.     Expected Outcomes  Pt will continue to participate in CR exercise, nutrition. and lifestyle modification opportunities.        ITP Comments: ITP Comments    Row Name 01/10/18 1017 01/18/18 1632 02/16/18 1642 03/13/18 1516 04/05/18  1123   ITP Comments  Medical Director- Dr. Fransico Him, MD  30 Day ITP Review.  Pt started exercise today and tolerated it well.   30 Day ITP Reciew. Dan Lester enjoys coming to CR.  He feels that he is stronger.   30 Day ITP Review. Dan Lester continues to enjoy CR and gain strength. He continues to work on exercising at home.   30 Day ITP Review. Dan Lester has purchased a stationary bike that he has been using for exercise at home.  He feels that he is increasing his confidence and strength.  Dan Lester will graduate soon on 04/12/18.      Comments: See ITP Comments.

## 2018-04-07 ENCOUNTER — Encounter (HOSPITAL_COMMUNITY): Payer: 59

## 2018-04-07 ENCOUNTER — Encounter (HOSPITAL_COMMUNITY)
Admission: RE | Admit: 2018-04-07 | Discharge: 2018-04-07 | Disposition: A | Discharge status: Home / Self Care | Payer: 59 | Source: Ambulatory Visit | Attending: Cardiovascular Disease | Admitting: Cardiovascular Disease

## 2018-04-07 DIAGNOSIS — Z951 Presence of aortocoronary bypass graft: Secondary | ICD-10-CM

## 2018-04-07 DIAGNOSIS — I214 Non-ST elevation (NSTEMI) myocardial infarction: Secondary | ICD-10-CM

## 2018-04-10 ENCOUNTER — Encounter (HOSPITAL_COMMUNITY): Payer: 59

## 2018-04-10 ENCOUNTER — Encounter (HOSPITAL_COMMUNITY)
Admission: RE | Admit: 2018-04-10 | Discharge: 2018-04-10 | Disposition: A | Discharge status: Home / Self Care | Payer: 59 | Source: Ambulatory Visit | Attending: Cardiovascular Disease | Admitting: Cardiovascular Disease

## 2018-04-10 DIAGNOSIS — Z951 Presence of aortocoronary bypass graft: Secondary | ICD-10-CM

## 2018-04-10 DIAGNOSIS — I214 Non-ST elevation (NSTEMI) myocardial infarction: Secondary | ICD-10-CM | POA: Diagnosis not present

## 2018-04-12 ENCOUNTER — Encounter (HOSPITAL_COMMUNITY)
Admission: RE | Admit: 2018-04-12 | Discharge: 2018-04-12 | Disposition: A | Discharge status: Home / Self Care | Payer: 59 | Source: Ambulatory Visit | Attending: Cardiovascular Disease | Admitting: Cardiovascular Disease

## 2018-04-12 ENCOUNTER — Encounter (HOSPITAL_COMMUNITY): Payer: 59

## 2018-04-12 DIAGNOSIS — I214 Non-ST elevation (NSTEMI) myocardial infarction: Secondary | ICD-10-CM | POA: Diagnosis not present

## 2018-04-12 DIAGNOSIS — Z951 Presence of aortocoronary bypass graft: Secondary | ICD-10-CM

## 2018-04-14 ENCOUNTER — Encounter (HOSPITAL_COMMUNITY): Payer: 59

## 2018-04-17 ENCOUNTER — Encounter (HOSPITAL_COMMUNITY): Payer: 59

## 2018-04-24 NOTE — Progress Notes (Signed)
Discharge Progress Report  Patient Details  Name: Dan Lester MRN: [id number] Date of Birth: [birth year] Referring Provider:     Ginger Blue from 01/10/2018 in [place]  Referring Provider  Nahser, Arnette Norris, MD       Number of Visits: 36  Reason for Discharge:  Patient reached a stable level of exercise. Patient independent in their exercise. Patient has met program and personal goals.  Smoking History:  Social History   Tobacco Use  Smoking Status Former Smoker  . 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)  ADL [facility]:   Initial Exercise Prescription: Initial Exercise Prescription - 01/10/18 1000      Date of Initial Exercise RX and Referring Provider   Date  01/10/18    Referring Provider  Nahser, Arnette Norris, MD      Bike   Level  0.9    Minutes  10    METs  3.19      NuStep   Level  3    SPM  85    Minutes  10    METs  3      Track   Laps  12    Minutes  10    METs  3.09      Prescription Details   Frequency (times per week)  3    Duration  Progress to 30 minutes of continuous aerobic without signs/symptoms of physical distress      Intensity   THRR 40-80% of Max Heartrate  60-120    Ratings of Perceived Exertion  11-13    Perceived Dyspnea  0-4      Progression   Progression  Continue to progress workloads to maintain intensity without signs/symptoms of physical distress.      Resistance Training   Training Prescription  Yes    Weight  3lbs    Reps  10-15       Discharge Exercise Prescription (Final Exercise Prescription Changes): Exercise Prescription Changes - 04/12/18 1000      Response to Exercise   Blood Pressure (Admit)  112/60    Blood Pressure (Exercise)  128/70    Blood Pressure (Exit)  116/72    Heart Rate (Admit)  72 bpm    Heart Rate  (Exercise)  86 bpm    Heart Rate (Exit)  72 bpm    Rating of Perceived Exertion (Exercise)  13    Perceived Dyspnea (Exercise)  0    Symptoms  None    Comments  Pt graduated from Cardiac Rehab    Duration  Continue with 45 min of aerobic exercise without signs/symptoms of physical distress.    Intensity  THRR unchanged      Progression   Progression  Continue to progress workloads to maintain intensity without signs/symptoms of physical distress.    Average METs  3.6      Resistance Training   Training Prescription  No      Bike   Level  1.1    Minutes  10    METs  3.67      NuStep   Level  5    SPM  105    Minutes  10    METs  3.1      Track   Laps  17    Minutes  10    METs  3.99  Home Exercise Plan   Plans to continue exercise at  Home (comment)   Walking & Stationary Bike    Frequency  Add 2 additional days to program exercise sessions.    Initial Home Exercises Provided  02/03/18       Functional Capacity: 6 Minute Walk    Row Name 01/10/18 0841 03/27/18 1519       6 Minute Walk   Phase  Initial  Discharge    Distance  1523 feet  1800 feet    Distance % Change  -  18.19 %    Distance Feet Change  -  277 ft    Walk Time  6 minutes  6 minutes    # of Rest Breaks  0  0    MPH  2.88  3.41    METS  3.29  3.91    RPE  12  11    Perceived Dyspnea   -  0    VO2 Peak  11.51  13.67    Symptoms  Yes (comment)  Yes (comment)    Comments  Patient c/o fatigue at end of walk test.  Patient c/o leg fatigue at end of walk test.    Resting HR  66 bpm  70 bpm    Resting BP  124/80  108/64    Resting Oxygen Saturation   98 %  -    Exercise Oxygen Saturation  during 6 min walk  97 %  -    Max Ex. HR  75 bpm  81 bpm    Max Ex. BP  118/82  130/72    2 Minute Post BP  124/78  108/60       Psychological, QOL, Others - Outcomes: PHQ 2/9: Depression screen [facility] 2/9 04/24/2018 01/18/2018  Decreased Interest 0 0  Down, Depressed, Hopeless 0 0  PHQ - 2 Score 0 0     Quality of Life: Quality of Life - 04/10/18 1047      Quality of Life Scores   Health/Function Pre  27.2 %    Health/Function Post  29.6 %    Health/Function % Change  8.82 %    Socioeconomic Pre  30 %    Socioeconomic Post  30 %    Socioeconomic % Change   0 %    Psych/Spiritual Pre  30 %    Psych/Spiritual Post  30 %    Psych/Spiritual % Change  0 %    Family Pre  30 %    Family Post  30 %    Family % Change  0 %    GLOBAL Pre  28.8 %    GLOBAL Post  29.82 %    GLOBAL % Change  3.54 %       Personal Goals: Goals established at orientation with interventions provided to work toward goal. Personal Goals and Risk Factors at Admission - 01/10/18 1022      Core Components/Risk Factors/Patient Goals on Admission   Diabetes  Yes    Intervention  Provide education about signs/symptoms and action to take for hypo/hyperglycemia.;Provide education about proper nutrition, including hydration, and aerobic/resistive exercise prescription along with prescribed medications to achieve blood glucose in normal ranges: Fasting glucose 65-99 mg/dL    Expected Outcomes  Short Term: Participant verbalizes understanding of the signs/symptoms and immediate care of hyper/hypoglycemia, proper foot care and importance of medication, aerobic/resistive exercise and nutrition plan for blood glucose control.;Long Term: Attainment of HbA1C < 7%.  Lipids  Yes    Intervention  Provide education and support for participant on nutrition & aerobic/resistive exercise along with prescribed medications to achieve LDL '70mg'$ , HDL >'40mg'$ .    Expected Outcomes  Short Term: Participant states understanding of desired cholesterol values and is compliant with medications prescribed. Participant is following exercise prescription and nutrition guidelines.;Long Term: Cholesterol controlled with medications as prescribed, with individualized exercise RX and with personalized nutrition plan. Value goals: LDL < '70mg'$ , HDL > 40  mg.    Stress  Yes    Intervention  Offer individual and/or small group education and counseling on adjustment to heart disease, stress management and health-related lifestyle change. Teach and support self-help strategies.;Refer participants experiencing significant psychosocial distress to appropriate mental health specialists for further evaluation and treatment. When possible, include family members and significant others in education/counseling sessions.    Expected Outcomes  Short Term: Participant demonstrates changes in health-related behavior, relaxation and other stress management skills, ability to obtain effective social support, and compliance with psychotropic medications if prescribed.;Long Term: Emotional wellbeing is indicated by absence of clinically significant psychosocial distress or social isolation.        Personal Goals Discharge: Goals and Risk Factor Review    Row Name 01/18/18 1458 01/23/18 1633 02/16/18 1644 03/13/18 1517 04/05/18 1508     Core Components/Risk Factors/Patient Goals Review   Personal Goals Review  Lipids;Diabetes;Stress  Lipids;Diabetes;Stress  Lipids;Diabetes;Stress  Lipids;Diabetes;Stress  Lipids;Diabetes;Stress   Review  Pt with multiple CAD RFs willing to participate in CR exercise.  Laruth Bouchard would like ot be able to feel better and restore his strength.  Pt with multiple CAD RFs willing to participate in CR exercise.  Laruth Bouchard is off to a great start and is tolerating exercise well.  VSS.  Pt with multiple CAD RFs willing to participate in CR exercise.  Laruth Bouchard continues to tolerate exercise well.  He feels that he is getting stronger. Encouraged patient to find time for exercising outside of CR.   Pt with multiple CAD RFs willing to participate in CR exercise.  Laruth Bouchard enjoys coming to CR.  He feels that he continues to gain strength.  Laruth Bouchard continues to work on exercising at home.   Pt with multiple CAD RFs willing to participate in CR.  Laruth Bouchard continues to  enjoy coming to CR.  He feels that he is getting stronger.  He has purchased a bike for home exercise and has been using it.    Expected Outcomes  Pt will continue to participate in CR exercise, nutrition. and lifestyle modification opportunities.   Pt will continue to participate in CR exercise, nutrition. and lifestyle modification opportunities.   Pt will continue to participate in CR exercise, nutrition. and lifestyle modification opportunities.   Pt will continue to participate in CR exercise, nutrition. and lifestyle modification opportunities.   Pt will continue to participate in CR exercise, nutrition. and lifestyle modification opportunities.    [place] Name 04/12/18 1119             Core Components/Risk Factors/Patient Goals Review   Personal Goals Review  Lipids;Diabetes;Stress       Review  Laruth Bouchard has graduated from CR with 36 complete sessions.  He enjoyed participating in CR and feels that he has increased his strength and self-esteem.        Expected Outcomes  Pt will continue to participate in exercise, nutrition. and lifestyle modification opportunities.  Laruth Bouchard plans to utilize his stationary bike.  Exercise Goals and Review: Exercise Goals    Row Name 01/10/18 0900             Exercise Goals   Increase Physical Activity  Yes       Intervention  Provide advice, education, support and counseling about physical activity/exercise needs.;Develop an individualized exercise prescription for aerobic and resistive training based on initial evaluation findings, risk stratification, comorbidities and participant's personal goals.       Expected Outcomes  Short Term: Attend rehab on a regular basis to increase amount of physical activity.;Long Term: Exercising regularly at least 3-5 days a week.;Long Term: Add in home exercise to make exercise part of routine and to increase amount of physical activity.       Increase Strength and Stamina  Yes       Intervention  Develop an  individualized exercise prescription for aerobic and resistive training based on initial evaluation findings, risk stratification, comorbidities and participant's personal goals.;Provide advice, education, support and counseling about physical activity/exercise needs.       Expected Outcomes  Short Term: Increase workloads from initial exercise prescription for resistance, speed, and METs.;Short Term: Perform resistance training exercises routinely during rehab and add in resistance training at home;Long Term: Improve cardiorespiratory fitness, muscular endurance and strength as measured by increased METs and functional capacity (6MWT)       Able to understand and use rate of perceived exertion (RPE) scale  Yes       Intervention  Provide education and explanation on how to use RPE scale       Expected Outcomes  Short Term: Able to use RPE daily in rehab to express subjective intensity level;Long Term:  Able to use RPE to guide intensity level when exercising independently       Knowledge and understanding of Target Heart Rate Range (THRR)  Yes       Intervention  Provide education and explanation of THRR including how the numbers were predicted and where they are located for reference       Expected Outcomes  Short Term: Able to state/look up THRR;Long Term: Able to use THRR to govern intensity when exercising independently;Short Term: Able to use daily as guideline for intensity in rehab       Able to check pulse independently  Yes       Intervention  Provide education and demonstration on how to check pulse in carotid and radial arteries.;Review the importance of being able to check your own pulse for safety during independent exercise       Expected Outcomes  Short Term: Able to explain why pulse checking is important during independent exercise;Long Term: Able to check pulse independently and accurately       Understanding of Exercise Prescription  Yes       Intervention  Provide education,  explanation, and written materials on patient's individual exercise prescription       Expected Outcomes  Short Term: Able to explain program exercise prescription;Long Term: Able to explain home exercise prescription to exercise independently          Exercise Goals Re-Evaluation: Exercise Goals Re-Evaluation    Row Name 02/03/18 1620 02/06/18 1613 03/13/18 0943 03/13/18 1018 04/05/18 1338     Exercise Goal Re-Evaluation   Exercise Goals Review  Increase Physical Activity;Able to understand and use rate of perceived exertion (RPE) scale;Knowledge and understanding of Target Heart Rate Range (THRR);Understanding of Exercise Prescription;Increase Strength and Stamina;Able to check pulse independently  Increase Physical  Activity;Able to understand and use rate of perceived exertion (RPE) scale;Knowledge and understanding of Target Heart Rate Range (THRR);Understanding of Exercise Prescription;Increase Strength and Stamina;Able to check pulse independently  Increase Physical Activity;Understanding of Exercise Prescription  (Pended)   -  Increase Physical Activity;Understanding of Exercise Prescription   Comments  Reviewed HEP with pt. Also reviewed RPE Scale, THRR, endpoints of exericse, weather conditions, NTG use, warmup and cool down.   Reviewed HEP with pt. Also reviewed RPE Scale, THRR, endpoints of exericse, weather conditions, NTG use, warmup and cool down.   -  Reviewed METs and Goals with pt. Pt states he is starting to get his strength back. Will follow up with pt regarding increasing Nustep Level to 4 from 3. Pt conitnues to put forth great effort with exercise.   Reviewed METs and Goals. Pt is continuing to respond well to exercise workloads. Pt is now exercising at a level 5 on Nustep. Pt is putting forth great effort with exercise and is able to exercise 30 minutes with minimal problems.    Expected Outcomes  Pt will plan to walk or bike 2 days a week. Pt will start with 10 minutes and  gradually increase to 30 minutes. Pt will continue to increase cardiorespiratory fitness. Will continue to monitor and continue.   Pt will plan to walk or bike 2 days a week. Pt will start with 10 minutes and gradually increase to 30 minutes. Pt will continue to increase cardiorespiratory fitness. Will continue to monitor and continue.   -  Pt will try 2 days a week at work. Pt states he is tired after work and rarely takes breaks. Encouraged pt to make an effort to take breaks not only for his mental health, but to also walk for physical health.   Pt will continue to walk 2 days a week 30 minutes. Pt's wife has bought pt a recumbent bike. Pt states he is really enjoying having that bike at home. Pt will increase cardiovascular strength. Will continue to monitor.    [place] Name 04/12/18 1035             Exercise Goal Re-Evaluation   Exercise Goals Review  Increase Physical Activity;Understanding of Exercise Prescription       Comments  Pt completed 36 sessions of rehab. Pt increased functional capacity by 18.19%. Pt increased post 6MWT distance by 277 ft. One of pt's goals was to increase strength. Pt stated he feels stronger and is now exercising on a regular basis.        Expected Outcomes  Pt will continue to walk and bike for 3-4 days a week 30-45 minutes. Pt is enjoying using his new recumbent bike. Pt is confident in his ability to continue  exercising on his own.           Nutrition & Weight - Outcomes: Pre Biometrics - 01/10/18 0745      Pre Biometrics   Height  '5\' 11"'$  (1.803 m)    Weight  77 kg    Waist Circumference  37 inches    Hip Circumference  39.5 inches    Waist to Hip Ratio  0.94 %    BMI (Calculated)  23.69    Triceps Skinfold  7 mm    % Body Fat  21.5 %    Grip Strength  37 kg    Flexibility  9.5 in    Single Leg Stand  15.03 seconds      Post Biometrics - 03/27/18  [place]   Height  '5\' 11"'$  (1.803 m)    Weight  76.4 kg    Waist Circumference  37  inches    Hip Circumference  39 inches    Waist to Hip Ratio  0.95 %    BMI (Calculated)  23.5    Triceps Skinfold  8 mm    % Body Fat  21.9 %    Grip Strength  32 kg    Flexibility  8 in    Single Leg Stand  6.15 seconds       Nutrition: Nutrition Therapy & Goals - 01/10/18 1357      Nutrition Therapy   Diet  heart healthy, carb modified      Personal Nutrition Goals   Nutrition Goal  Pt to identify and limit food sources of saturated fat, trans fat, refined carbohydrates and sodium      Intervention Plan   Intervention  Prescribe, educate and counsel regarding individualized specific dietary modifications aiming towards targeted core components such as weight, hypertension, lipid management, diabetes, heart failure and other comorbidities.    Expected Outcomes  Short Term Goal: Understand basic principles of dietary content, such as calories, fat, sodium, cholesterol and nutrients.;Long Term Goal: Adherence to prescribed nutrition plan.       Nutrition Discharge: Nutrition Assessments - 01/10/18 1402      MEDFICTS Scores   Pre Score  36       Education Questionnaire Score: Knowledge Questionnaire Score - 04/10/18 1047      Knowledge Questionnaire Score   Pre Score  22/24    Post Score  21/24       Goals reviewed with patient; copy given to patient.

## 2018-05-15 DIAGNOSIS — J069 Acute upper respiratory infection, unspecified: Secondary | ICD-10-CM | POA: Diagnosis not present

## 2018-05-15 DIAGNOSIS — J209 Acute bronchitis, unspecified: Secondary | ICD-10-CM | POA: Diagnosis not present

## 2018-05-15 DIAGNOSIS — R05 Cough: Secondary | ICD-10-CM | POA: Diagnosis not present

## 2018-05-19 DIAGNOSIS — R82998 Other abnormal findings in urine: Secondary | ICD-10-CM | POA: Diagnosis not present

## 2018-05-19 DIAGNOSIS — E038 Other specified hypothyroidism: Secondary | ICD-10-CM | POA: Diagnosis not present

## 2018-05-19 DIAGNOSIS — Z0000 Encounter for general adult medical examination without abnormal findings: Secondary | ICD-10-CM | POA: Diagnosis not present

## 2018-05-24 DIAGNOSIS — J069 Acute upper respiratory infection, unspecified: Secondary | ICD-10-CM | POA: Diagnosis not present

## 2018-05-24 DIAGNOSIS — Z0000 Encounter for general adult medical examination without abnormal findings: Secondary | ICD-10-CM | POA: Diagnosis not present

## 2018-05-24 DIAGNOSIS — J209 Acute bronchitis, unspecified: Secondary | ICD-10-CM | POA: Diagnosis not present

## 2018-05-24 DIAGNOSIS — R05 Cough: Secondary | ICD-10-CM | POA: Diagnosis not present

## 2018-06-03 IMAGING — MR MR PELVIS WO/W CM
6 of 10 series · 24 of 48 positions shown · IV contrast (15 ml multihance)
Comparison: CT scan 01/14/2017

CLINICAL DATA: Nonhealing wounds medial aspect of the inferior
right buttock region

EXAM:
MRI PELVIS WITHOUT AND WITH CONTRAST
TECHNIQUE: Multiplanar multisequence MR imaging of the pelvis was performed
both before and after administration of intravenous contrast.
CONTRAST:  15mL MULTIHANCE GADOBENATE DIMEGLUMINE 529 MG/ML IV SOLN

[Series 2: STIR · coronal · 5.0mm · 1.41mm/px · 4 of 31 slices shown]
[im 1/31]
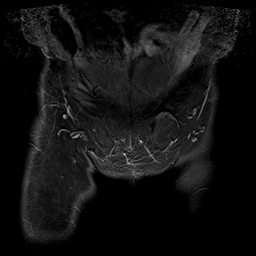
[im 11/31]
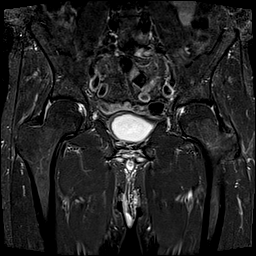
[im 21/31]
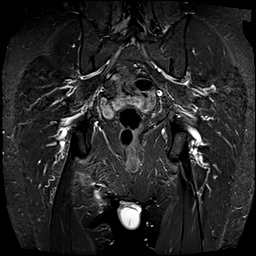
[im 31/31]
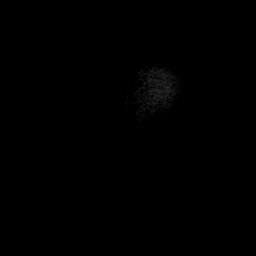

[Series 3: T1 · coronal · 5.0mm · 1.41mm/px · 3 of 31 slices shown (1 of 2)]
[im 1/31]
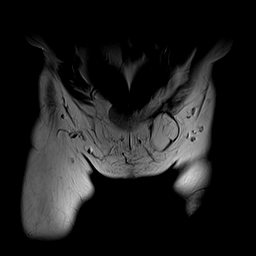
[im 16/31]
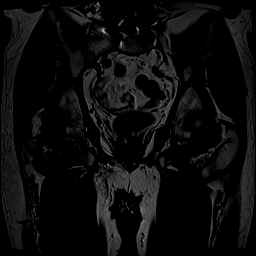
[im 31/31]
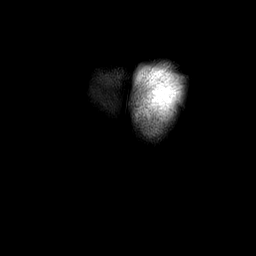

[Series 4: T2 fat-sat · sagittal · 5.0mm · 0.64mm/px · 6 of 54 slices shown (1 of 2)]
[im 1/54]
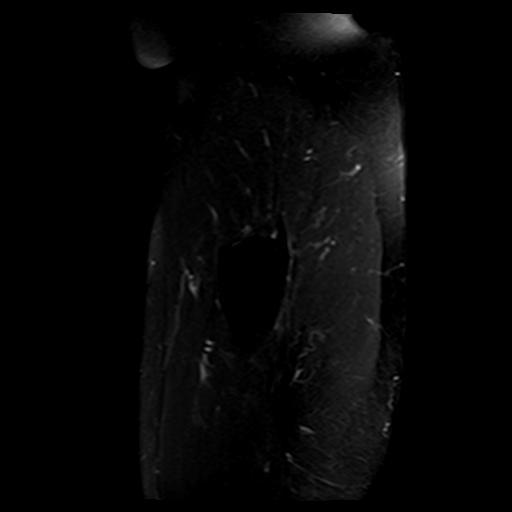
[im 11/54]
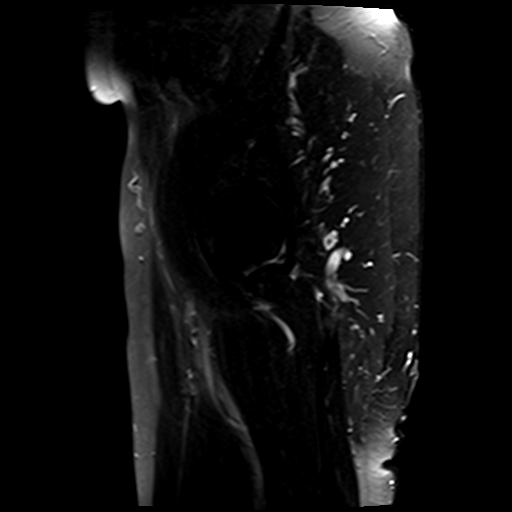
[im 22/54]
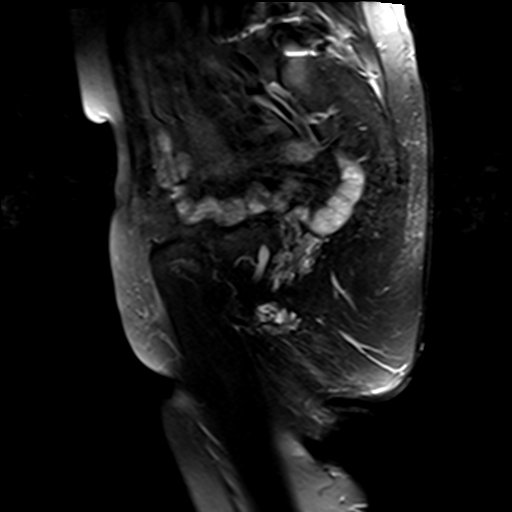
[im 32/54]
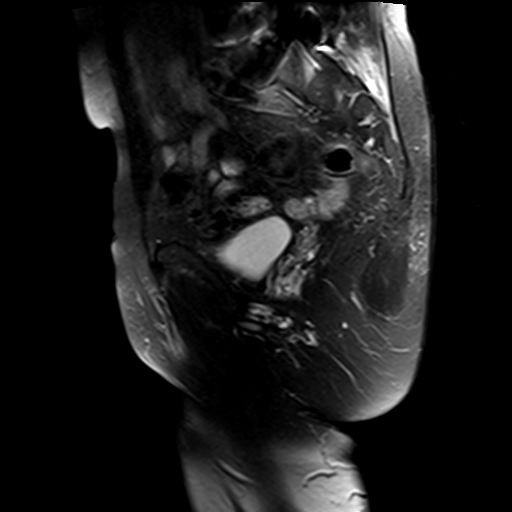
[im 43/54]
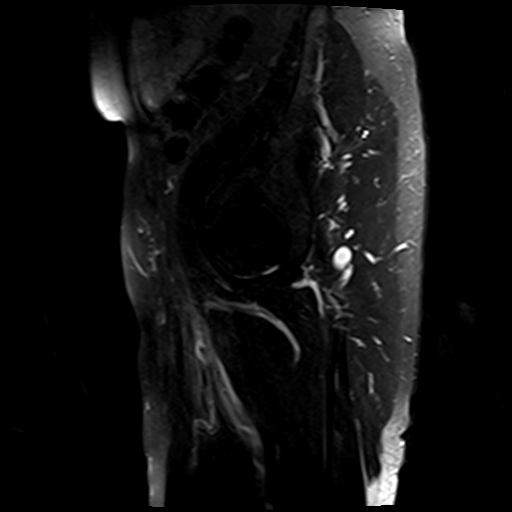
[im 54/54]
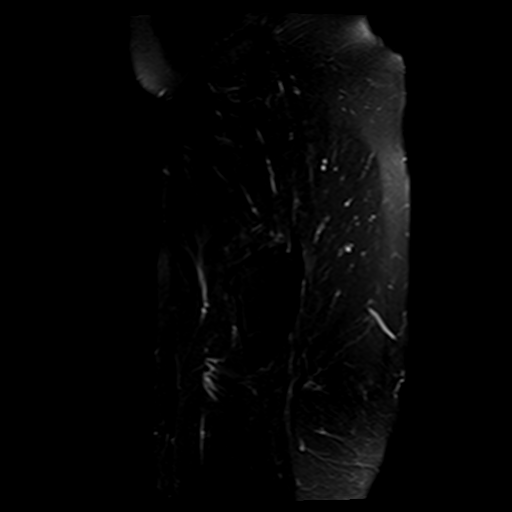

[Series 5: T1 · axial · 5.0mm · 0.62mm/px · z∈[-117,+165]mm · 5 of 48 slices shown (2 of 2)]
[im 1/48]
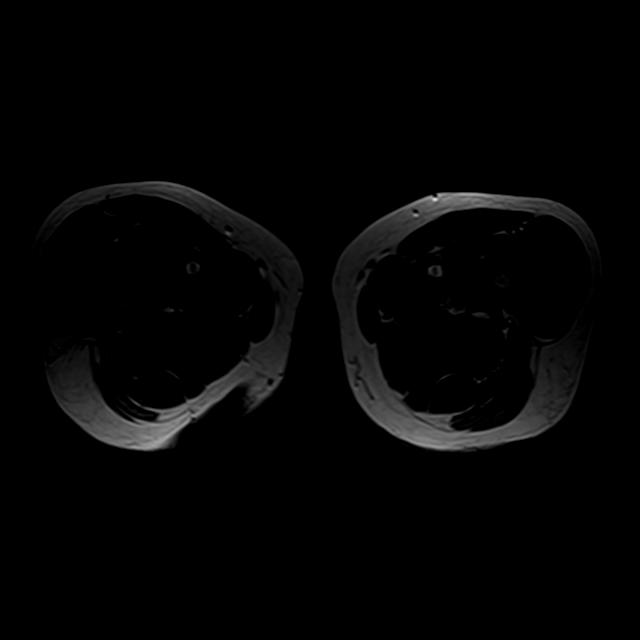
[im 12/48]
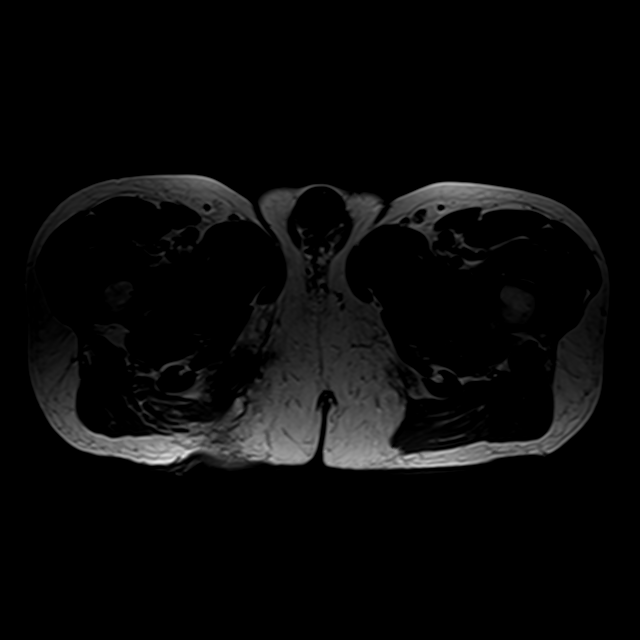
[im 24/48]
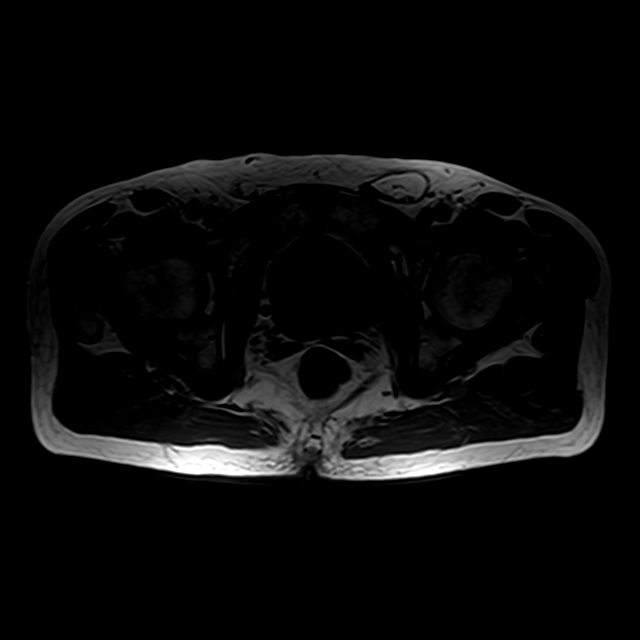
[im 36/48]
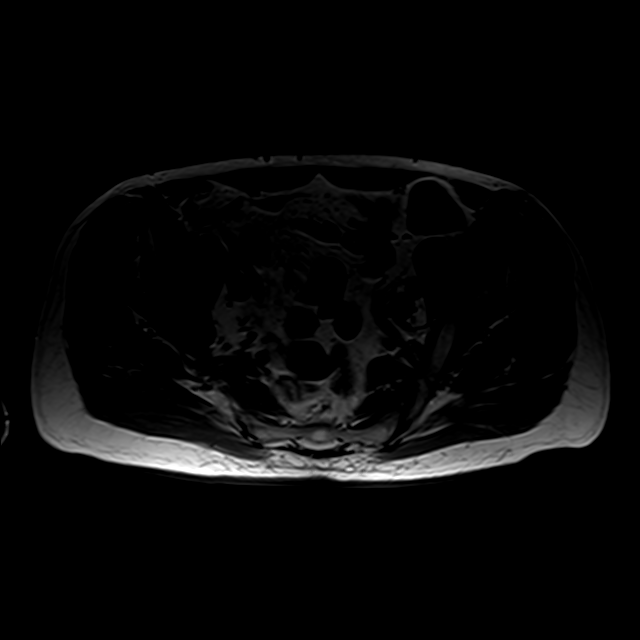
[im 48/48]
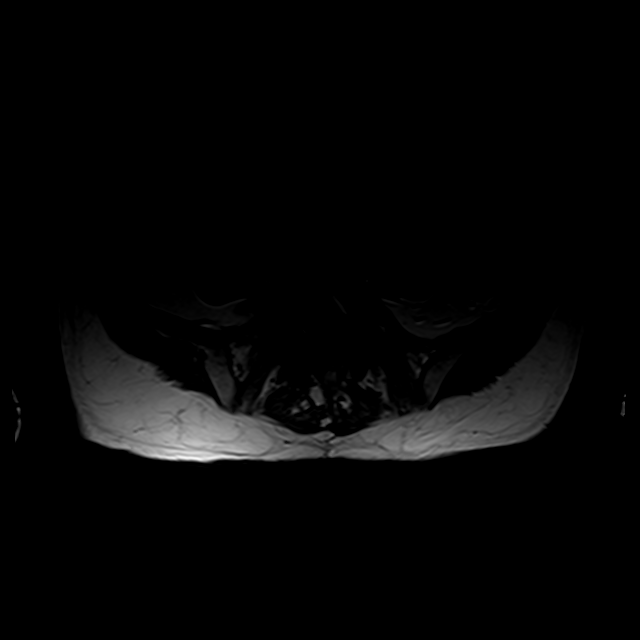

[Series 6: T2 fat-sat · axial · 5.0mm · 1.25mm/px · z∈[-117,+165]mm · 5 of 48 slices shown (2 of 2)]
[im 1/48]
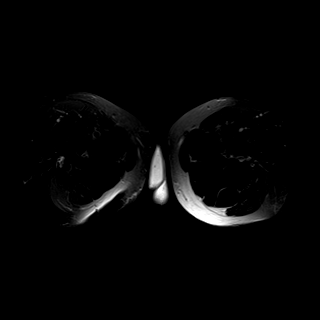
[im 12/48]
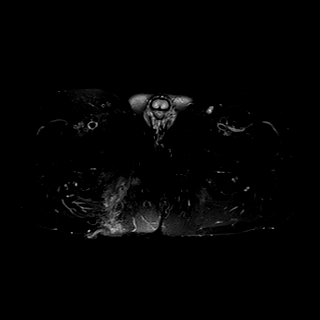
[im 24/48]
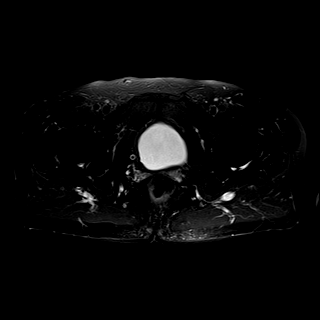
[im 36/48]
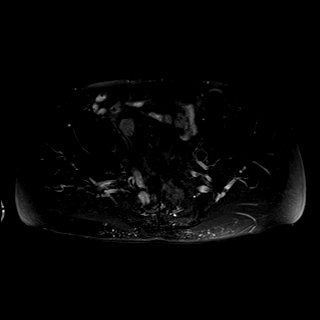
[im 48/48]
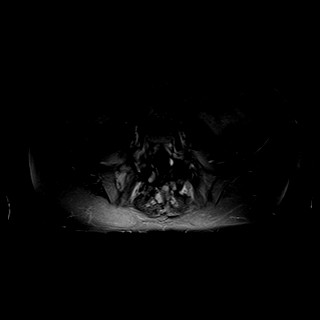

[Series 7: T1 fat-sat · axial · 5.0mm · 0.62mm/px · 1 of 48 slices shown]
[im 1/48]
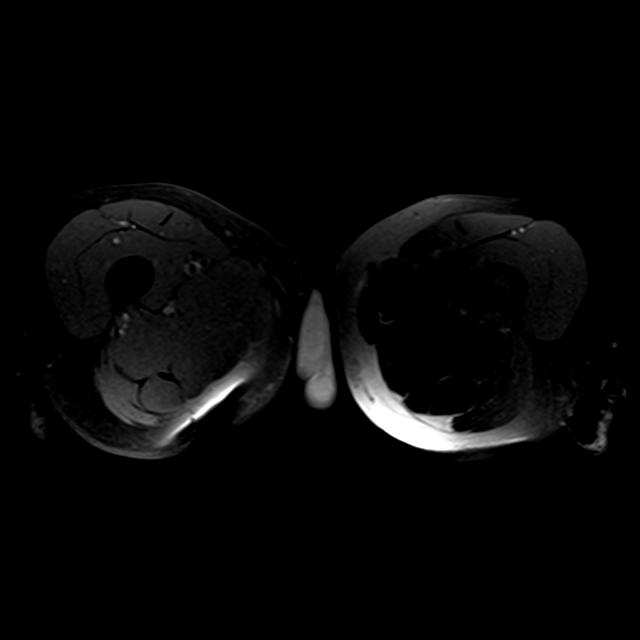

[24 of 48 positions shown; findings below may reference images not displayed]

FINDINGS: Urinary Tract:  Unremarkable.

Bowel: No small bowel or colonic dilatation within the visualized
pelvis.

Vascular/Lymphatic: No pelvic sidewall lymphadenopathy.

Reproductive: Susceptibility artifact in the prostate gland
compatible with brachytherapy seeds.

Other: No free fluid in the visualized peritoneal cavity the pelvis.

Musculoskeletal: Small bilateral groin hernias, left greater than
right, contain only fat.

The soft tissue ulcer/ wound is again identified along the inferior
aspect of the right buttock. The apparent granulation tissue then
tracks deep to the level of the right ischial tuberosity, seen on
CT. Although the inflammatory changes do abut the bony anatomy,
there is no cortical disruption and there is no edema or abnormal
enhancement within the marrow of the ischial tuberosity suggest
osteomyelitis. No focal rim enhancing fluid collection to suggest
abscess.
IMPRESSION: As noted on the recent CT scan, the patient's right inferior gluteal
ulcer/wound tracks deep to the level of the right ischial
tuberosity, but there is no MR evidence of osteomyelitis at this
time. No focal or rim enhancing soft tissue abscess.

## 2018-09-19 ENCOUNTER — Telehealth: Payer: Self-pay | Admitting: Cardiovascular Disease

## 2018-09-19 NOTE — Telephone Encounter (Signed)
Spoke with patient who confirmed all demographics. Patient has a smart phone. I sent him a link for My Chart and the virtual consent to his e-mail. He will have vitals ready for his visit.

## 2018-09-20 ENCOUNTER — Encounter: Payer: Self-pay | Admitting: Cardiovascular Disease

## 2018-09-20 ENCOUNTER — Other Ambulatory Visit: Payer: Self-pay

## 2018-09-20 ENCOUNTER — Telehealth (INDEPENDENT_AMBULATORY_CARE_PROVIDER_SITE_OTHER): Payer: 59 | Admitting: Cardiovascular Disease

## 2018-09-20 VITALS — BP 137/90 | Ht 72.0 in | Wt 170.0 lb

## 2018-09-20 DIAGNOSIS — Z7189 Other specified counseling: Secondary | ICD-10-CM

## 2018-09-20 DIAGNOSIS — I2510 Atherosclerotic heart disease of native coronary artery without angina pectoris: Secondary | ICD-10-CM

## 2018-09-20 DIAGNOSIS — E785 Hyperlipidemia, unspecified: Secondary | ICD-10-CM

## 2018-09-20 DIAGNOSIS — E782 Mixed hyperlipidemia: Secondary | ICD-10-CM

## 2018-09-20 NOTE — Patient Instructions (Signed)
Medication Instructions:  No changes If you need a refill on your cardiac medications before your next appointment, please call your pharmacy.   Lab work: none If you have labs (blood work) drawn today and your tests are completely normal, you will receive your results only by: Marland Kitchen MyChart Message (if you have MyChart) OR . A paper copy in the mail If you have any lab test that is abnormal or we need to change your treatment, we will call you to review the results.  Testing/Procedures: none  Follow-Up: Your physician wants you to follow-up in: 6 months with Dan Dopp, PA-C You will receive a reminder letter in the mail two months in advance. If you don't receive a letter, please call our office to schedule the follow-up appointment.  Your physician wants you to follow-up in: 12 months with Dr. Acie Fredrickson.  You will receive a reminder letter in the mail two months in advance. If you don't receive a letter, please call our office to schedule the follow-up appointment.  Any Other Special Instructions Will Be Listed Below (If Applicable).

## 2018-09-20 NOTE — Progress Notes (Signed)
Virtual Visit via Video Note   This visit type was conducted due to national recommendations for restrictions regarding the COVID-19 Pandemic (e.g. social distancing) in an effort to limit this patient's exposure and mitigate transmission in our community.  Due to his co-morbid illnesses, this patient is at least at moderate risk for complications without adequate follow up.  This format is felt to be most appropriate for this patient at this time.  All issues noted in this document were discussed and addressed.  A limited physical exam was performed with this format.  Please refer to the patient's chart for his consent to telehealth for [facility].   Date:  09/20/2018   ID:  [name], DOB 1946-02-02, MRN [id number]  Patient Location: Home Provider Location: Home  PCP:  Burnard Bunting, MD  Cardiologist:  Mertie Moores, MD  Electrophysiologist:  None   Problem list 1.  Coronary artery disease-post CABG in June, 2019 2.  Atrial fibrillation-following CABG 3.  Diabetes mellitus 4.  Hyperlipidemia 5.  Prostate cancer   Evaluation Performed:  Follow-Up Visit  Chief Complaint:  CAD   History of Present Illness:    Dan Lester is a 73 y.o. male with CAD . He is seen via computer video visit. Had MI and CABG in June 2019. Post op AF. Has completed cardiac rehab.   Exercising at home. Has an exercise bike and walks the dog daily 3/4 mile  No CP, dyspnea, no syncope Does have fatigue with walking.   Med list reviewed VS reviewed   Social distancing,  Wears mask  The patient does not have symptoms concerning for COVID-19 infection (fever, chills, cough, or new shortness of breath).    Past Medical History:  Diagnosis Date  . Abscess of right thigh 11/06/2013  . Arthritis   . Asthma   . Cancer [facility])    prostate  . Chronic cholecystitis with calculus 10/19/2013  . Coronary artery disease 09/30/2017   NSTEMI 6/19 >> 3V CAD >> s/p CABG (LIMA-LAD, SVG-D1,  SVG-OM/distal LCx and SVG-proximal PDA)  . Depression   . Diabetes mellitus without complication (HCC)    , diet controlled  . GERD (gastroesophageal reflux disease)   . H/O colonoscopy 03/12/2009  . Hernia, inguinal, left 12/17/2011  . History of kidney stones   . History of pressure ulcer on buttock 03/22/2017  . Hyperlipidemia   . Hypothyroidism   . Inguinal hernia   . Neck pain   . NSTEMI (non-ST elevated myocardial infarction) ([place])   . Postoperative atrial flutter (HCC)    Post CABG >> converted on Amiodarone  . Thyroid disease    hyperthyroidism; surgery changed him to hypo  . Type 2 diabetes mellitus with complication, without long-term current use of insulin ([place]) 10/18/2017   Past Surgical History:  Procedure Laterality Date  . BACK SURGERY  09/2005  . CHOLECYSTECTOMY    . CORONARY ARTERY BYPASS GRAFT N/A 09/30/2017   Procedure: CORONARY ARTERY BYPASS GRAFTING (CABG) times five using left internal mammary artery to left anterior descending and endoscopically harvested right saphenous vein graft;  Surgeon: [name], MD;  Location: [place];  Service: Open Heart Surgery;  Laterality: N/A;  . DEBRIDMENT OF DECUBITUS ULCER Right 08/29/2015   Procedure: COMPLEX REPAIR BUTTOCKS 3CM;  Surgeon: Irene Limbo, MD;  Location: [place];  Service: Plastics;  Laterality: Right;  . excision of Ischial pressure ulcer Right 03/23/2017  . EYE SURGERY     tumor removed; right  .  HERNIA REPAIR    . LEFT HEART CATH AND CORONARY ANGIOGRAPHY N/A 09/27/2017   Procedure: LEFT HEART CATH AND CORONARY ANGIOGRAPHY;  Surgeon: Belva Crome, MD;  Location: [place] CV LAB;  Service: Cardiovascular;  Laterality: N/A;  . MASS EXCISION Right 03/22/2017   Procedure: EXCISION OF ISCHIAL PRESSURE  ULCER SKIN FLAP CLOSURE;  Surgeon: Irene Limbo, MD;  Location: [place];  Service: Plastics;  Laterality: Right;  . PROSTATE SURGERY  11/2004   seed implant  . SINUS SURGERY WITH  INSTATRAK    . TEE WITHOUT CARDIOVERSION N/A 09/30/2017   Procedure: TRANSESOPHAGEAL ECHOCARDIOGRAM (TEE);  Surgeon: [name], MD;  Location: [place];  Service: Open Heart Surgery;  Laterality: N/A;     Current Meds  Medication Sig  . aspirin EC 81 MG tablet Take 1 tablet (81 mg total) by mouth daily.  Marland Kitchen atorvastatin (LIPITOR) 80 MG tablet Take 1 tablet (80 mg total) by mouth daily at 6 PM.  . buPROPion (WELLBUTRIN XL) 150 MG 24 hr tablet Take 150 mg daily by mouth.  . diphenhydrAMINE-APAP, sleep, (TYLENOL PM EXTRA STRENGTH PO) Take 1 tablet by mouth as needed.  . fluticasone (FLONASE) 50 MCG/ACT nasal spray Place 2 sprays daily into both nostrils.  . Fluticasone-Salmeterol (ADVAIR) 250-50 MCG/DOSE AEPB Inhale 1 puff daily as needed into the lungs (shortness of breath).   Marland Kitchen levothyroxine (SYNTHROID) 200 MCG tablet Take 200 mcg every evening by mouth.   . MELATONIN PO Take 1 tablet at bedtime as needed by mouth (sleep).  . metFORMIN (GLUCOPHAGE) 500 MG tablet Take 500 mg 2 (two) times daily with a meal by mouth.  . metoprolol tartrate (LOPRESSOR) 25 MG tablet Take 1 tablet (25 mg total) by mouth 2 (two) times daily.  . Multiple Vitamin (MULTIVITAMIN WITH MINERALS) TABS tablet Take 1 tablet daily by mouth.  . nabumetone (RELAFEN) 500 MG tablet Take 500 mg by mouth See admin instructions.  . pantoprazole (PROTONIX) 40 MG tablet Take 40 mg daily by mouth.  . SUPER B COMPLEX/C CAPS Take 1 tablet daily by mouth.  . traMADol (ULTRAM) 50 MG tablet Take 1 tablet (50 mg total) by mouth every 12 (twelve) hours as needed.     Allergies:   Patient has no known allergies.   Social History   Tobacco Use  . Smoking status: Former Smoker    Types: Cigars  . Smokeless tobacco: Never Used  Substance Use Topics  . Alcohol use: No  . Drug use: No     Family Hx: The patient's family history includes Cancer in his father; Heart disease in his father and mother.  ROS:   Please see the history  of present illness.     All other systems reviewed and are negative.   Prior CV studies:   The following studies were reviewed today:    Labs/Other Tests and Data Reviewed:    EKG:  No ECG reviewed.  Recent Labs: 09/27/2017: TSH 0.080 10/01/2017: Magnesium 2.4 10/03/2017: BUN 22; Creatinine, Ser 1.03; Hemoglobin 9.2; Platelets 116; Potassium 4.1; Sodium 137 03/30/2018: ALT 25   Recent Lipid Panel Lab Results  Component Value Date/Time   CHOL 118 03/30/2018 04:14 PM   TRIG 92 03/30/2018 04:14 PM   HDL 48 03/30/2018 04:14 PM   CHOLHDL 2.5 03/30/2018 04:14 PM   LDLCALC 52 03/30/2018 04:14 PM    Wt Readings from Last 3 Encounters:  09/20/18 170 lb (77.1 kg)  03/27/18 168 lb 6.9 oz (76.4 kg)  01/10/18 169 lb 12.1 oz (77 kg)     Objective:    Vital Signs:  BP 137/90 (BP Location: Left Arm, Patient Position: Sitting, Cuff Size: Normal)   Ht 6' (1.829 m)   Wt 170 lb (77.1 kg)   BMI 23.06 kg/m    VITAL SIGNS:  reviewed GEN:  no acute distress EYES:  sclerae anicteric, EOMI - Extraocular Movements Intact RESPIRATORY:  normal respiratory effort, symmetric expansion CARDIOVASCULAR:  no peripheral edema SKIN:  no rash, lesions or ulcers. MUSCULOSKELETAL:  no obvious deformities. NEURO:  alert and oriented x 3, no obvious focal deficit PSYCH:  normal affect  ASSESSMENT & PLAN:    1. CAD:  Doing well.  No angina.  Cardiac rehab.  2.  Postoperative atrial fibrillation.  He had some postoperative atrial fibrillation.  He was treated with amiodarone for short the symptoms of irregular heart rate.  I have advised him to check his pulse frequently.  3.  Hyperlipidemia: His last lipid levels look great.  His total cholesterol is 118.  The HDL is 48.  The LDL is 52.  The triglyceride level is 90.  Continue Atorvastatin  80 mg a day.  He will meet with Dr. Reynaldo Minium who will be probably ordering additional updated labs  next week.  COVID-19 Education: The signs and symptoms of  COVID-19 were discussed with the patient and how to seek care for testing (follow up with PCP or arrange E-visit).  The importance of social distancing was discussed today.  Time:   Today, I have spent  19 minutes with the patient with telehealth technology discussing the above problems.     Medication Adjustments/Labs and Tests Ordered: Current medicines are reviewed at length with the patient today.  Concerns regarding medicines are outlined above.   Tests Ordered: No orders of the defined types were placed in this encounter.   Medication Changes: No orders of the defined types were placed in this encounter.   Disposition:  Follow up in 6 month(s) to see Richardson Dopp, PA. I'll see him back in 1 year - sooner if needed.   Signed, Mertie Moores, MD  09/20/2018 9:14 AM    [place]

## 2019-02-03 IMAGING — DX DG CHEST 1V PORT
1 series · 1 of 1 positions shown · non-contrast
Comparison: 10/02/2017

CLINICAL DATA: Followup CABG

EXAM:
PORTABLE CHEST 1 VIEW

[chest ap]
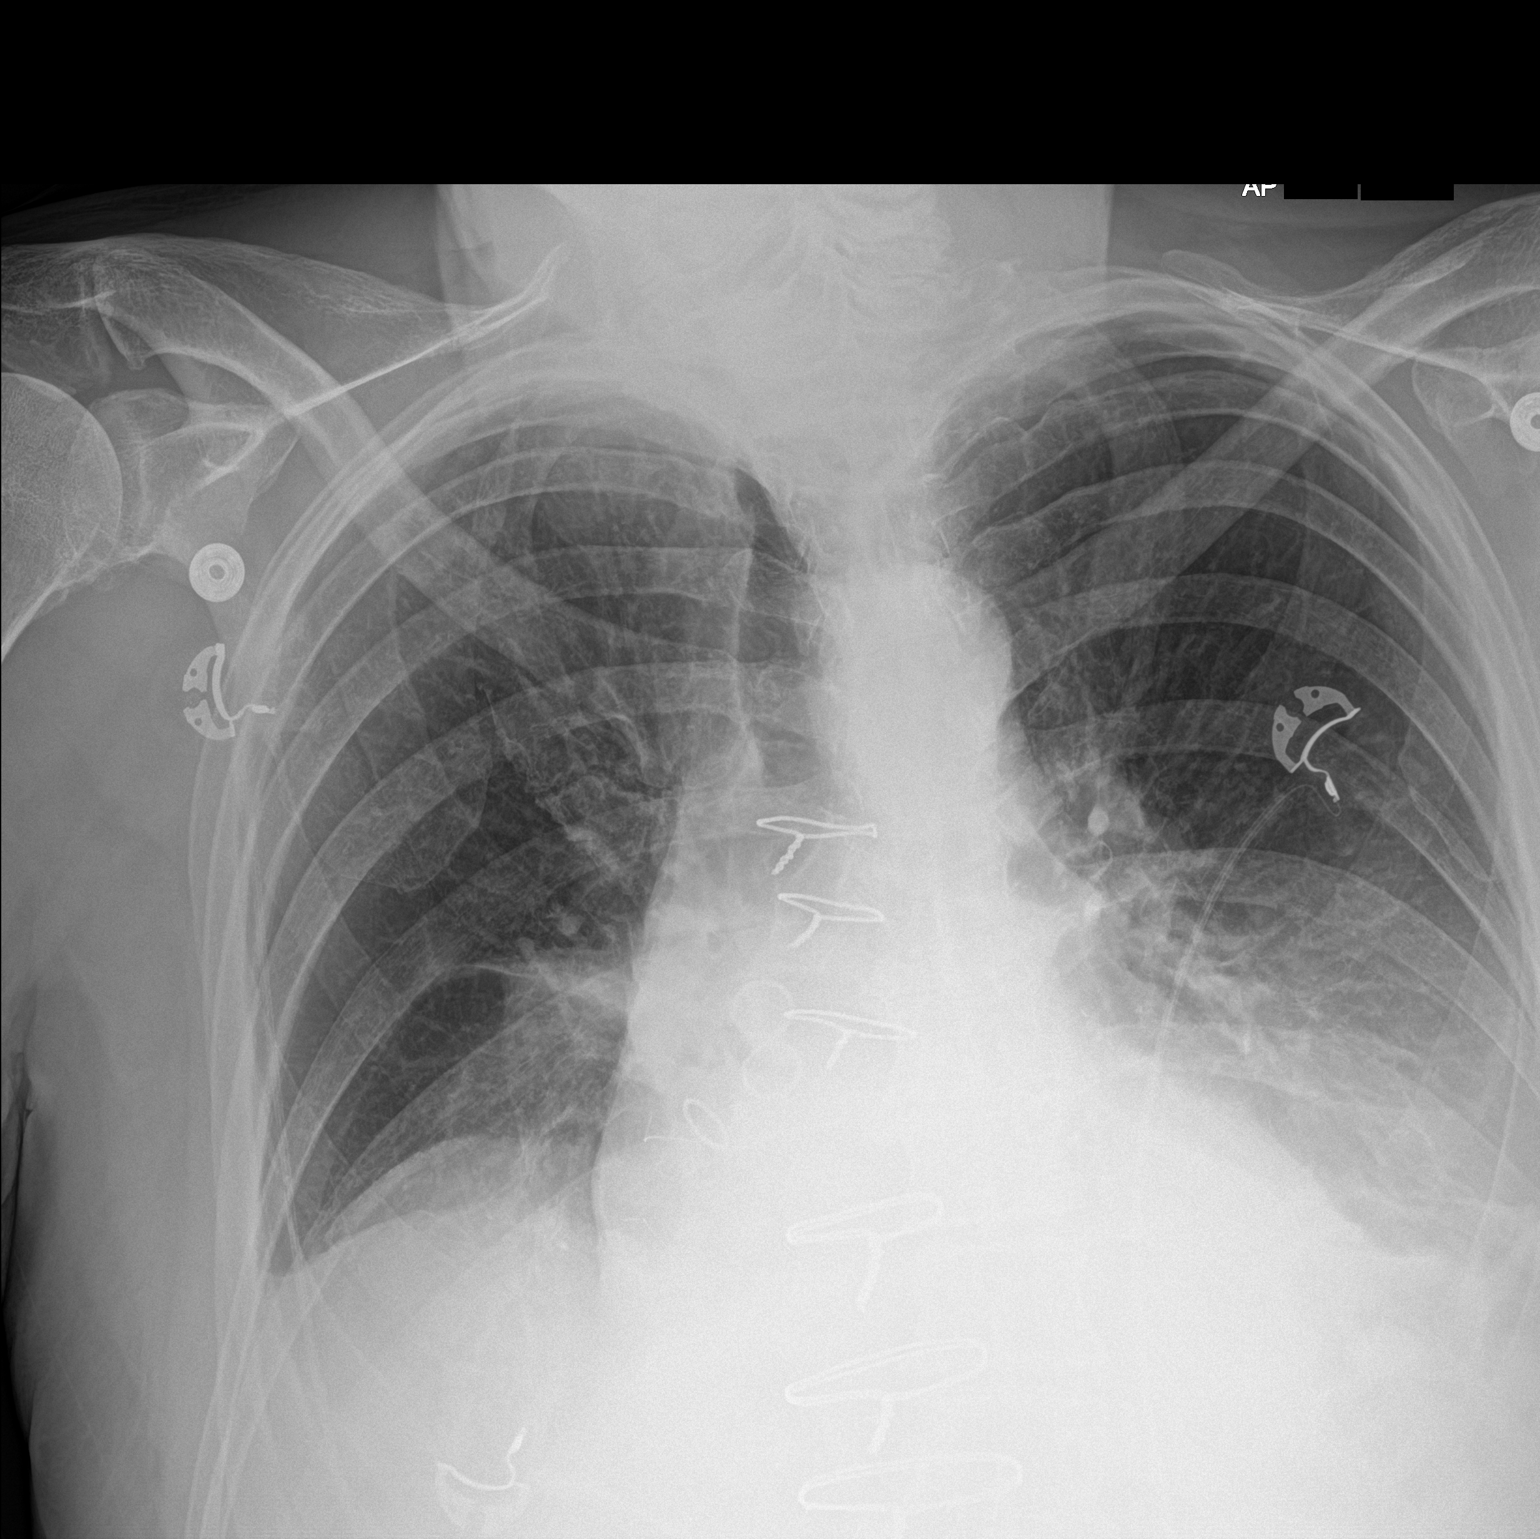

[1 of 1 positions shown; findings below may reference images not displayed]

FINDINGS: Venous access sheath has been removed. Persistent effusions and
basilar atelectasis, left more than right. No visible pleural air.
No new finding otherwise.
IMPRESSION: Venous access sheath removed. Bilateral effusions with lower lung
atelectasis, left worse than right.

## 2019-03-09 ENCOUNTER — Other Ambulatory Visit: Payer: Self-pay | Admitting: Physician Assistant

## 2019-10-18 ENCOUNTER — Other Ambulatory Visit: Payer: Self-pay | Admitting: Physician Assistant

## 2020-01-23 ENCOUNTER — Other Ambulatory Visit: Payer: Self-pay | Admitting: Cardiovascular Disease

## 2020-02-08 ENCOUNTER — Encounter: Payer: Self-pay | Admitting: Podiatry

## 2020-02-08 ENCOUNTER — Ambulatory Visit: Payer: 59 | Admitting: Podiatry

## 2020-02-08 ENCOUNTER — Other Ambulatory Visit: Payer: Self-pay

## 2020-02-08 DIAGNOSIS — S90211A Contusion of right great toe with damage to nail, initial encounter: Secondary | ICD-10-CM | POA: Diagnosis not present

## 2020-02-08 NOTE — Progress Notes (Signed)
Subjective:  Patient ID: Dan Lester Doctor, male    DOB: 1946-03-22,  MRN: [id number]  Chief Complaint  Patient presents with  . Nail Problem    Pt stated that right hallux nail is coming off he is a type 2 diabetic     74 y.o. male presents with the above complaint.  Patient presents with loosening of the nail to the right hallux that has been going on for quite some time.  Patient states that he is a type II diabetic with last A1c of 8.3.  Patient states he is of worried that he may get caught on something and pulled all the way.  It is 80% disattached already.  He needs some help getting it off completely.  He denies any other acute complaints.  He would like to have it removed he has not seen anyone else prior to seeing me.   Review of Systems: Negative except as noted in the HPI. Denies N/V/F/Ch.  Past Medical History:  Diagnosis Date  . Abscess of right thigh 11/06/2013  . Arthritis   . Asthma   . Cancer [facility])    prostate  . Chronic cholecystitis with calculus 10/19/2013  . Coronary artery disease 09/30/2017   NSTEMI 6/19 >> 3V CAD >> s/p CABG (LIMA-LAD, SVG-D1, SVG-OM/distal LCx and SVG-proximal PDA)  . Depression   . Diabetes mellitus without complication (HCC)    , diet controlled  . GERD (gastroesophageal reflux disease)   . H/O colonoscopy 03/12/2009  . Hernia, inguinal, left 12/17/2011  . History of kidney stones   . History of pressure ulcer on buttock 03/22/2017  . Hyperlipidemia   . Hypothyroidism   . Inguinal hernia   . Neck pain   . NSTEMI (non-ST elevated myocardial infarction) ([place])   . Postoperative atrial flutter (HCC)    Post CABG >> converted on Amiodarone  . Thyroid disease    hyperthyroidism; surgery changed him to hypo  . Type 2 diabetes mellitus with complication, without long-term current use of insulin ([place]) 10/18/2017    Current Outpatient Medications:  .  aspirin EC 81 MG tablet, Take 1 tablet (81 mg total) by mouth daily., Disp: , Rfl:  .   atorvastatin (LIPITOR) 80 MG tablet, Take 1 tablet (80 mg total) by mouth daily at 6 PM., Disp: 90 tablet, Rfl: 3 .  buPROPion (WELLBUTRIN XL) 150 MG 24 hr tablet, Take 150 mg daily by mouth., Disp: , Rfl:  .  diphenhydrAMINE-APAP, sleep, (TYLENOL PM EXTRA STRENGTH PO), Take 1 tablet by mouth as needed., Disp: , Rfl:  .  fluticasone (FLONASE) 50 MCG/ACT nasal spray, Place 2 sprays daily into both nostrils., Disp: , Rfl:  .  Fluticasone-Salmeterol (ADVAIR) 250-50 MCG/DOSE AEPB, Inhale 1 puff daily as needed into the lungs (shortness of breath). , Disp: , Rfl:  .  levothyroxine (SYNTHROID) 200 MCG tablet, Take 200 mcg every evening by mouth. , Disp: , Rfl:  .  MELATONIN PO, Take 1 tablet at bedtime as needed by mouth (sleep)., Disp: , Rfl:  .  metFORMIN (GLUCOPHAGE) 500 MG tablet, Take 500 mg 2 (two) times daily with a meal by mouth., Disp: , Rfl:  .  metoprolol tartrate (LOPRESSOR) 25 MG tablet, Take 1 tablet (25 mg total) by mouth 2 (two) times daily. Please make overdue appt with Dr. Acie Fredrickson before anymore refills. 2nd attempt, Disp: 30 tablet, Rfl: 0 .  Multiple Vitamin (MULTIVITAMIN WITH MINERALS) TABS tablet, Take 1 tablet daily by mouth., Disp: , Rfl:  .  nabumetone (RELAFEN) 500 MG tablet, Take 500 mg by mouth See admin instructions., Disp: , Rfl: 6 .  pantoprazole (PROTONIX) 40 MG tablet, Take 40 mg daily by mouth., Disp: , Rfl:  .  SUPER B COMPLEX/C CAPS, Take 1 tablet daily by mouth., Disp: , Rfl:  .  traMADol (ULTRAM) 50 MG tablet, Take 1 tablet (50 mg total) by mouth every 12 (twelve) hours as needed., Disp: 20 tablet, Rfl: 0  Social History   Tobacco Use  Smoking Status Former Smoker  . Types: Cigars  Smokeless Tobacco Never Used    No Known Allergies Objective:  There were no vitals filed for this visit. There is no height or weight on file to calculate BMI. Constitutional Well developed. Well nourished.  Vascular Dorsalis pedis pulses palpable bilaterally. Posterior tibial  pulses palpable bilaterally. Capillary refill normal to all digits.  No cyanosis or clubbing noted. Pedal hair growth normal.  Neurologic Normal speech. Oriented to person, place, and time. Epicritic sensation to light touch grossly present bilaterally.  Dermatologic Pain on palpation of the entire/total nail on 1st digit of the right No other open wounds. No skin lesions.  Orthopedic: Normal joint ROM without pain or crepitus bilaterally. No visible deformities. No bony tenderness.   Radiographs: None Assessment:   1. Contusion of right great toe with damage to nail, initial encounter    Plan:  Patient was evaluated and treated and all questions answered.  Nail contusion/dystrophy hallux, right -Patient elects to proceed with minor surgery to remove entire toenail today. Consent reviewed and signed by patient. -Entire/total nail excised. See procedure note. -Educated on post-procedure care including soaking. Written instructions provided and reviewed. -Patient to follow up in 2 weeks for nail check.  Procedure: Excision of entire/total nail  Location: Right 1st toe digit Anesthesia: Lidocaine 1% plain; 1.5 mL and Marcaine 0.5% plain; 1.5 mL, digital block. Skin Prep: Betadine. Dressing: Silvadene; telfa; dry, sterile, compression dressing. Technique: Following skin prep, the toe was exsanguinated and a tourniquet was secured at the base of the toe. The affected nail border was freed and excised. The tourniquet was then removed and sterile dressing applied. Disposition: Patient tolerated procedure well. Patient to return in 2 weeks for follow-up.   No follow-ups on file.

## 2020-02-13 ENCOUNTER — Ambulatory Visit: Payer: Self-pay | Admitting: Podiatry

## 2020-08-02 ENCOUNTER — Encounter: Payer: Self-pay | Admitting: Cardiovascular Disease

## 2020-08-02 NOTE — Progress Notes (Signed)
Cardiology Office Note:    Date:  08/04/2020   ID:  Dan Lester, DOB 08-24-1945, MRN [id number]  PCP:  Burnard Bunting, [place]  Cardiologist:  Mertie Moores, MD  Advanced Practice Provider:  No care team member to display Electrophysiologist:  None   [id number]}   Referring MD: Burnard Bunting, MD   Chief Complaint  Patient presents with  . Coronary Artery Disease    August 04, 2020   Dan Lester is a 75 y.o. male with a hx of CAD, hyperlipidemia. I saw him as a telemedicine visit  May, 2020   He has a history of coronary artery disease.  Heart catheterization in June, 2019 revealed severe three-vessel coronary artery disease.  He had coronary artery bypass grafting, and LIMA to the LAD, SVG to D1, SVG to OM/distal left circumflex artery, SVG to proximal PDA No cp or dyspnea .  Exercises some .   Walks some   Past Medical History:  Diagnosis Date  . Abscess of right thigh 11/06/2013  . Arthritis   . Asthma   . Cancer [facility])    prostate  . Chronic cholecystitis with calculus 10/19/2013  . Coronary artery disease 09/30/2017   NSTEMI 6/19 >> 3V CAD >> s/p CABG (LIMA-LAD, SVG-D1, SVG-OM/distal LCx and SVG-proximal PDA)  . Depression   . Diabetes mellitus without complication (HCC)    , diet controlled  . GERD (gastroesophageal reflux disease)   . H/O colonoscopy 03/12/2009  . Hernia, inguinal, left 12/17/2011  . History of kidney stones   . History of pressure ulcer on buttock 03/22/2017  . Hyperlipidemia   . Hypothyroidism   . Inguinal hernia   . Neck pain   . NSTEMI (non-ST elevated myocardial infarction) ([place])   . Postoperative atrial flutter (HCC)    Post CABG >> converted on Amiodarone  . Thyroid disease    hyperthyroidism; surgery changed him to hypo  . Type 2 diabetes mellitus with complication, without long-term current use of insulin ([place]) 10/18/2017    Past Surgical History:  Procedure Laterality Date  . BACK  SURGERY  09/2005  . CHOLECYSTECTOMY    . CORONARY ARTERY BYPASS GRAFT N/A 09/30/2017   Procedure: CORONARY ARTERY BYPASS GRAFTING (CABG) times five using left internal mammary artery to left anterior descending and endoscopically harvested right saphenous vein graft;  Surgeon: [name], MD;  Location: [place];  Service: Open Heart Surgery;  Laterality: N/A;  . DEBRIDMENT OF DECUBITUS ULCER Right 08/29/2015   Procedure: COMPLEX REPAIR BUTTOCKS 3CM;  Surgeon: Irene Limbo, MD;  Location: [place];  Service: Plastics;  Laterality: Right;  . excision of Ischial pressure ulcer Right 03/23/2017  . EYE SURGERY     tumor removed; right  . HERNIA REPAIR    . LEFT HEART CATH AND CORONARY ANGIOGRAPHY N/A 09/27/2017   Procedure: LEFT HEART CATH AND CORONARY ANGIOGRAPHY;  Surgeon: Belva Crome, MD;  Location: [place] CV LAB;  Service: Cardiovascular;  Laterality: N/A;  . MASS EXCISION Right 03/22/2017   Procedure: EXCISION OF ISCHIAL PRESSURE  ULCER SKIN FLAP CLOSURE;  Surgeon: Irene Limbo, MD;  Location: [place];  Service: Plastics;  Laterality: Right;  . PROSTATE SURGERY  11/2004   seed implant  . SINUS SURGERY WITH INSTATRAK    . TEE WITHOUT CARDIOVERSION N/A 09/30/2017   Procedure: TRANSESOPHAGEAL ECHOCARDIOGRAM (TEE);  Surgeon: [name], MD;  Location: [place];  Service: Open Heart Surgery;  Laterality: N/A;  Current Medications: Current Meds  Medication Sig  . aspirin EC 81 MG tablet Take 1 tablet (81 mg total) by mouth daily.  Marland Kitchen b complex vitamins capsule Take 1 tablet by mouth daily.  Marland Kitchen buPROPion (WELLBUTRIN XL) 150 MG 24 hr tablet Take 150 mg daily by mouth.  . diphenhydrAMINE-APAP, sleep, (TYLENOL PM EXTRA STRENGTH PO) Take 1 tablet by mouth as needed.  . fluticasone (FLONASE) 50 MCG/ACT nasal spray Place 2 sprays daily into both nostrils.  . Fluticasone-Salmeterol (ADVAIR) 250-50 MCG/DOSE AEPB Inhale 1 puff daily as needed into the lungs (shortness of  breath).   Marland Kitchen levothyroxine (SYNTHROID) 200 MCG tablet Take 200 mcg every evening by mouth.   . losartan (COZAAR) 50 MG tablet Take 1 tablet (50 mg total) by mouth daily.  Marland Kitchen MELATONIN PO Take 1 tablet at bedtime as needed by mouth (sleep).  . metFORMIN (GLUCOPHAGE) 500 MG tablet Take 500 mg 2 (two) times daily with a meal by mouth.  . Multiple Vitamin (MULTIVITAMIN WITH MINERALS) TABS tablet Take 1 tablet daily by mouth.  . nabumetone (RELAFEN) 500 MG tablet Take 500 mg by mouth See admin instructions.  . pantoprazole (PROTONIX) 40 MG tablet Take 40 mg daily by mouth.  . rosuvastatin (CRESTOR) 10 MG tablet Take 1 tablet (10 mg total) by mouth daily.  . SUPER B COMPLEX/C CAPS Take 1 tablet daily by mouth.  . traMADol (ULTRAM) 50 MG tablet Take 1 tablet (50 mg total) by mouth every 12 (twelve) hours as needed.  . [DISCONTINUED] atorvastatin (LIPITOR) 80 MG tablet Take 1 tablet (80 mg total) by mouth daily at 6 PM.  . [DISCONTINUED] metoprolol tartrate (LOPRESSOR) 25 MG tablet Take 1 tablet (25 mg total) by mouth 2 (two) times daily. Please make overdue appt with Dr. Acie Fredrickson before anymore refills. 2nd attempt     Allergies:   Patient has no known allergies.   Social History   Socioeconomic History  . Marital status: Married    Spouse name: Not on file  . Number of children: Not on file  . Years of education: Not on file  . Highest education level: Not on file  Occupational History  . Not on file  Tobacco Use  . Smoking status: Former Smoker    Types: Cigars  . Smokeless tobacco: Never Used  Vaping Use  . Vaping Use: Never used  Substance and Sexual Activity  . Alcohol use: No  . Drug use: No  . Sexual activity: Not on file  Other Topics Concern  . Not on file  Social History Narrative  . Not on file   Social Determinants of Health   Financial Resource Strain: Not on file  Food Insecurity: Not on file  Transportation Needs: Not on file  Physical Activity: Not on file   Stress: Not on file  Social Connections: Not on file     Family History: The patient's family history includes Cancer in his father; Heart disease in his father and mother.  ROS:   Please see the history of present illness.     All other systems reviewed and are negative.  EKGs/Labs/Other Studies Reviewed:    The following studies were reviewed today:   EKG:   August 04, 2020:  NSR at 65.  No ST or T wave changes   Recent Labs: No results found for requested labs within last 8760 hours.  Recent Lipid Panel    Component Value Date/Time   CHOL 118 03/30/2018 1614   TRIG 92  03/30/2018 1614   HDL 48 03/30/2018 1614   CHOLHDL 2.5 03/30/2018 1614   LDLCALC 52 03/30/2018 1614     Risk Assessment/Calculations:     Physical Exam:    VS:  BP (!) 142/84   Pulse 65   Ht 6\' 1"  (1.854 m)   Wt 175 lb 3.2 oz (79.5 kg)   SpO2 98%   BMI 23.11 kg/m     Wt Readings from Last 3 Encounters:  08/04/20 175 lb 3.2 oz (79.5 kg)  09/20/18 170 lb (77.1 kg)  03/27/18 168 lb 6.9 oz (76.4 kg)     GEN:  Well nourished, well developed in no acute distress HEENT: Normal NECK: No JVD; No carotid bruits LYMPHATICS: No lymphadenopathy CARDIAC: RRR, no murmurs, rubs, gallops RESPIRATORY:  Clear to auscultation without rales, wheezing or rhonchi  ABDOMEN: Soft, non-tender, non-distended MUSCULOSKELETAL:  No edema; No deformity  SKIN: Warm and dry NEUROLOGIC:  Alert and oriented x 3 PSYCHIATRIC:  Normal affect   ASSESSMENT:    1. Coronary artery disease involving native coronary artery of native heart without angina pectoris    PLAN:    In order of problems listed above:  1. CAD :   No cp , dyspnea Cont current meds.   Will see him in 1 year     Medication Adjustments/Labs and Tests Ordered: Current medicines are reviewed at length with the patient today.  Concerns regarding medicines are outlined above.  Orders Placed This Encounter  Procedures  . EKG 12-Lead   Meds  ordered this encounter  Medications  . rosuvastatin (CRESTOR) 10 MG tablet    Sig: Take 1 tablet (10 mg total) by mouth daily.    Dispense:  90 tablet    Refill:  3  . losartan (COZAAR) 50 MG tablet    Sig: Take 1 tablet (50 mg total) by mouth daily.    Dispense:  90 tablet    Refill:  3    Patient Instructions  Medication Instructions:  Your physician has recommended you make the following change in your medication:  START Rosuvastatin 10mg  daily Losartan 50mg  daily   *If you need a refill on your cardiac medications before your next appointment, please call your pharmacy*   Lab Work: none If you have labs (blood work) drawn today and your tests are completely normal, you will receive your results only by: Marland Kitchen MyChart Message (if you have MyChart) OR . A paper copy in the mail If you have any lab test that is abnormal or we need to change your treatment, we will call you to review the results.   Testing/Procedures: none   Follow-Up: At [facility], you and your health needs are our priority.  As part of our continuing mission to provide you with exceptional heart care, we have created designated Provider Care Teams.  These Care Teams include your primary Cardiologist (physician) and Advanced Practice Providers (APPs -  Physician Assistants and Nurse Practitioners) who all work together to provide you with the care you need, when you need it.   Your next appointment:   1 year(s)  The format for your next appointment:   In Person  Provider:   You may see Mertie Moores, MD or one of the following Advanced Practice Providers on your designated Care Team:    Richardson Dopp, PA-C  Robbie Lis, [place]       Signed, Mertie Moores, MD  08/04/2020 5:40 PM    [place]

## 2020-08-04 ENCOUNTER — Other Ambulatory Visit: Payer: Self-pay

## 2020-08-04 ENCOUNTER — Encounter: Payer: Self-pay | Admitting: Cardiovascular Disease

## 2020-08-04 ENCOUNTER — Ambulatory Visit: Payer: 59 | Admitting: Cardiovascular Disease

## 2020-08-04 VITALS — BP 142/84 | HR 65 | Ht 73.0 in | Wt 175.2 lb

## 2020-08-04 DIAGNOSIS — I2510 Atherosclerotic heart disease of native coronary artery without angina pectoris: Secondary | ICD-10-CM

## 2020-08-04 MED ORDER — LOSARTAN POTASSIUM 50 MG PO TABS: 50.0000 mg | ORAL_TABLET | Freq: Every day | ORAL | 3 refills | Status: DC

## 2020-08-04 MED ORDER — ROSUVASTATIN CALCIUM 10 MG PO TABS: 10.0000 mg | ORAL_TABLET | Freq: Every day | ORAL | 3 refills | Status: DC

## 2020-08-04 NOTE — Patient Instructions (Signed)
Medication Instructions:  Your physician has recommended you make the following change in your medication:  START Rosuvastatin 10mg  daily Losartan 50mg  daily   *If you need a refill on your cardiac medications before your next appointment, please call your pharmacy*   Lab Work: none If you have labs (blood work) drawn today and your tests are completely normal, you will receive your results only by: Marland Kitchen MyChart Message (if you have MyChart) OR . A paper copy in the mail If you have any lab test that is abnormal or we need to change your treatment, we will call you to review the results.   Testing/Procedures: none   Follow-Up: At [facility], you and your health needs are our priority.  As part of our continuing mission to provide you with exceptional heart care, we have created designated Provider Care Teams.  These Care Teams include your primary Cardiologist (physician) and Advanced Practice Providers (APPs -  Physician Assistants and Nurse Practitioners) who all work together to provide you with the care you need, when you need it.   Your next appointment:   1 year(s)  The format for your next appointment:   In Person  Provider:   You may see Mertie Moores, MD or one of the following Advanced Practice Providers on your designated Care Team:    Richardson Dopp, PA-C  [place], [place]

## 2021-07-24 ENCOUNTER — Other Ambulatory Visit: Payer: Self-pay | Admitting: Cardiovascular Disease

## 2021-07-27 ENCOUNTER — Other Ambulatory Visit: Payer: Self-pay | Admitting: *Deleted

## 2021-07-27 MED ORDER — LOSARTAN POTASSIUM 50 MG PO TABS: 50.0000 mg | ORAL_TABLET | Freq: Every day | ORAL | 0 refills | Status: DC

## 2021-08-30 ENCOUNTER — Other Ambulatory Visit: Payer: Self-pay | Admitting: Cardiovascular Disease

## 2021-10-01 ENCOUNTER — Other Ambulatory Visit: Payer: Self-pay | Admitting: Cardiovascular Disease

## 2021-10-01 ENCOUNTER — Encounter: Payer: Self-pay | Admitting: Cardiovascular Disease

## 2021-10-01 NOTE — Progress Notes (Signed)
Cardiology Office Note:    Date:  10/02/2021   ID:  Dan Lester, DOB May 31, 1945, MRN [id number]  PCP:  Burnard Bunting, Jeannette  Cardiologist:  Mertie Moores, MD  Advanced Practice Provider:  No care team member to display Electrophysiologist:  None   :[id number]}   Referring MD: Burnard Bunting, MD   Chief Complaint  Patient presents with   Coronary Artery Disease         August 04, 2020   Dan Lester is a 76 y.o. male with a hx of CAD, hyperlipidemia. I saw him as a telemedicine visit  May, 2020   He has a history of coronary artery disease.  Heart catheterization in June, 2019 revealed severe three-vessel coronary artery disease.  He had coronary artery bypass grafting, and LIMA to the LAD, SVG to D1, SVG to OM/distal left circumflex artery, SVG to proximal PDA No cp or dyspnea .  Exercises some .   Walks some   October 02, 2021 Dan Lester is seen for follow up of his CAD, HLD, Cath 2019 showed 3 V cad. Had CABG  No CP, no dyspnea Tries to exercise Walks some  Admits that he needs to exercise more   Working on an old [employer] ( [employer])    Past Medical History:  Diagnosis Date   Abscess of right thigh 11/06/2013   Arthritis    Asthma    Cancer ([place])    prostate   Chronic cholecystitis with calculus 10/19/2013   Coronary artery disease 09/30/2017   NSTEMI 6/19 >> 3V CAD >> s/p CABG (LIMA-LAD, SVG-D1, SVG-OM/distal LCx and SVG-proximal PDA)   Depression    Diabetes mellitus without complication (HCC)    , diet controlled   GERD (gastroesophageal reflux disease)    H/O colonoscopy 03/12/2009   Hernia, inguinal, left 12/17/2011   History of kidney stones    History of pressure ulcer on buttock 03/22/2017   Hyperlipidemia    Hypothyroidism    Inguinal hernia    Neck pain    NSTEMI (non-ST elevated myocardial infarction) ([place])    Postoperative atrial flutter (HCC)    Post CABG >> converted on Amiodarone   Thyroid disease     hyperthyroidism; surgery changed him to hypo   Type 2 diabetes mellitus with complication, without long-term current use of insulin ([place]) 10/18/2017    Past Surgical History:  Procedure Laterality Date   BACK SURGERY  09/2005   CHOLECYSTECTOMY     CORONARY ARTERY BYPASS GRAFT N/A 09/30/2017   Procedure: CORONARY ARTERY BYPASS GRAFTING (CABG) times five using left internal mammary artery to left anterior descending and endoscopically harvested right saphenous vein graft;  Surgeon: [name], MD;  Location: [place];  Service: Open Heart Surgery;  Laterality: N/A;   DEBRIDMENT OF DECUBITUS ULCER Right 08/29/2015   Procedure: COMPLEX REPAIR BUTTOCKS 3CM;  Surgeon: Irene Limbo, MD;  Location: [place];  Service: Plastics;  Laterality: Right;   excision of Ischial pressure ulcer Right 03/23/2017   EYE SURGERY     tumor removed; right   HERNIA REPAIR     LEFT HEART CATH AND CORONARY ANGIOGRAPHY N/A 09/27/2017   Procedure: LEFT HEART CATH AND CORONARY ANGIOGRAPHY;  Surgeon: Belva Crome, MD;  Location: [place] [facility];  Service: Cardiovascular;  Laterality: N/A;   MASS EXCISION Right 03/22/2017   Procedure: EXCISION OF ISCHIAL PRESSURE  ULCER SKIN FLAP CLOSURE;  Surgeon: Irene Limbo, MD;  Location: [facility]  OR;  Service: Clinical cytogeneticist;  Laterality: Right;   PROSTATE SURGERY  11/2004   seed implant   SINUS SURGERY WITH INSTATRAK     TEE WITHOUT CARDIOVERSION N/A 09/30/2017   Procedure: TRANSESOPHAGEAL ECHOCARDIOGRAM (TEE);  Surgeon: [name], MD;  Location: [place];  Service: Open Heart Surgery;  Laterality: N/A;    Current Medications: Current Meds  Medication Sig   aspirin EC 81 MG tablet Take 1 tablet (81 mg total) by mouth daily.   b complex vitamins capsule Take 1 tablet by mouth daily.   fluticasone (FLONASE) 50 MCG/ACT nasal spray Place 2 sprays daily into both nostrils.   Fluticasone-Salmeterol (ADVAIR) 250-50 MCG/DOSE AEPB Inhale 1 puff daily as needed  into the lungs (shortness of breath).    LANTUS SOLOSTAR 100 UNIT/ML Solostar Pen Inject 10 Units into the skin at bedtime.   levothyroxine (SYNTHROID) 200 MCG tablet Take 200 mcg every evening by mouth.    losartan (COZAAR) 50 MG tablet TAKE 1 TABLET(50 MG) BY MOUTH DAILY   MELATONIN PO Take 1 tablet at bedtime as needed by mouth (sleep).   metFORMIN (GLUCOPHAGE) 500 MG tablet Take 500 mg 2 (two) times daily with a meal by mouth.   Multiple Vitamin (MULTIVITAMIN WITH MINERALS) TABS tablet Take 1 tablet daily by mouth.   nabumetone (RELAFEN) 500 MG tablet Take 500 mg by mouth daily.   pantoprazole (PROTONIX) 40 MG tablet Take 40 mg daily by mouth.   rosuvastatin (CRESTOR) 10 MG tablet TAKE 1 TABLET(10 MG) BY MOUTH DAILY   traMADol (ULTRAM) 50 MG tablet Take 1 tablet (50 mg total) by mouth every 12 (twelve) hours as needed.     Allergies:   Patient has no known allergies.   Social History   Socioeconomic History   Marital status: Married    Spouse name: Not on file   Number of children: Not on file   Years of education: Not on file   Highest education level: Not on file  Occupational History   Not on file  Tobacco Use   Smoking status: Former    Types: Cigars   Smokeless tobacco: Never  Vaping Use   Vaping Use: Never used  Substance and Sexual Activity   Alcohol use: No   Drug use: No   Sexual activity: Not on file  Other Topics Concern   Not on file  Social History Narrative   Not on file   Social Determinants of Health   Financial Resource Strain: Not on file  Food Insecurity: Not on file  Transportation Needs: Not on file  Physical Activity: Not on file  Stress: Not on file  Social Connections: Not on file     Family History: The patient's family history includes Cancer in his father; Heart disease in his father and mother.  ROS:   Please see the history of present illness.     All other systems reviewed and are negative.  EKGs/Labs/Other Studies Reviewed:     The following studies were reviewed today:   EKG: Do not, 2023: Normal sinus rhythm at 74.  Normal EKG    Recent Labs: No results found for requested labs within last 365 days.  Recent Lipid Panel    Component Value Date/Time   CHOL 118 03/30/2018 1614   TRIG 92 03/30/2018 1614   HDL 48 03/30/2018 1614   CHOLHDL 2.5 03/30/2018 1614   LDLCALC 52 03/30/2018 1614     Risk Assessment/Calculations:     Physical Exam:  Physical Exam: Blood pressure 130/80, pulse 76, height '6\' 1"'$  (1.854 m), weight 173 lb (78.5 kg), SpO2 98 %.  GEN:  Well nourished, well developed in no acute distress HEENT: Normal NECK: No JVD; No carotid bruits LYMPHATICS: No lymphadenopathy CARDIAC: RRR , no murmurs, rubs, gallops RESPIRATORY:  Clear to auscultation without rales, wheezing or rhonchi  ABDOMEN: Soft, non-tender, non-distended MUSCULOSKELETAL:  No edema; No deformity  SKIN: Warm and dry NEUROLOGIC:  Alert and oriented x 3   ASSESSMENT:    1. Mixed hyperlipidemia   2. Coronary artery disease involving native coronary artery of native heart without angina pectoris   3. Primary hypertension     PLAN:      CAD :   doing well.  No cp or dyspnea.   Needs to exercise more   Hyperlipidemia :   check labs today ,  cont rosuvastatin   3. HTN:   BP is well controlled.     Medication Adjustments/Labs and Tests Ordered: Current medicines are reviewed at length with the patient today.  Concerns regarding medicines are outlined above.  Orders Placed This Encounter  Procedures   ALT   Lipid panel   Basic metabolic panel   EKG 20-EBXI   No orders of the defined types were placed in this encounter.   Patient Instructions  Medication Instructions:  Your physician recommends that you continue on your current medications as directed. Please refer to the Current Medication list given to you today.  *If you need a refill on your cardiac medications before your next appointment,  please call your pharmacy*   Lab Work: LIPIDS, ALT, BMET Today If you have labs (blood work) drawn today and your tests are completely normal, you will receive your results only by: [place] (if you have MyChart) OR A paper copy in the mail If you have any lab test that is abnormal or we need to change your treatment, we will call you to review the results.   Testing/Procedures: NONE   Follow-Up: At [facility], you and your health needs are our priority.  As part of our continuing mission to provide you with exceptional heart care, we have created designated Provider Care Teams.  These Care Teams include your primary Cardiologist (physician) and Advanced Practice Providers (APPs -  Physician Assistants and Nurse Practitioners) who all work together to provide you with the care you need, when you need it.  We recommend signing up for the patient portal called "MyChart".  Sign up information is provided on this After Visit Summary.  MyChart is used to connect with patients for Virtual Visits (Telemedicine).  Patients are able to view lab/test results, encounter notes, upcoming appointments, etc.  Non-urgent messages can be sent to your provider as well.   To learn more about what you can do with MyChart, go to [email or web address].    Your next appointment:   1 year(s)  The format for your next appointment:   In Person  Provider:   Ronn Melena, Bronsen Serano {  I   Important Information About Sugar         Signed, Mertie Moores, MD  10/02/2021 9:51 AM    [place]

## 2021-10-02 ENCOUNTER — Ambulatory Visit: Payer: 59 | Admitting: Cardiovascular Disease

## 2021-10-02 ENCOUNTER — Encounter: Payer: Self-pay | Admitting: Cardiovascular Disease

## 2021-10-02 VITALS — BP 130/80 | HR 76 | Ht 73.0 in | Wt 173.0 lb

## 2021-10-02 DIAGNOSIS — E782 Mixed hyperlipidemia: Secondary | ICD-10-CM | POA: Diagnosis not present

## 2021-10-02 DIAGNOSIS — I10 Essential (primary) hypertension: Secondary | ICD-10-CM | POA: Diagnosis not present

## 2021-10-02 DIAGNOSIS — I2510 Atherosclerotic heart disease of native coronary artery without angina pectoris: Secondary | ICD-10-CM

## 2021-10-02 LAB — BASIC METABOLIC PANEL
BUN/Creatinine Ratio: 20 (ref 10–24)
BUN: 25 mg/dL (ref 8–27)
CO2: 27 mmol/L (ref 20–29)
Calcium: 9.4 mg/dL (ref 8.6–10.2)
Chloride: 102 mmol/L (ref 96–106)
Creatinine, Ser: 1.26 mg/dL (ref 0.76–1.27)
Glucose: 114 mg/dL — ABNORMAL HIGH (ref 70–99)
Potassium: 4.6 mmol/L (ref 3.5–5.2)
Sodium: 138 mmol/L (ref 134–144)
eGFR: 60 mL/min/{1.73_m2} (ref >59)

## 2021-10-02 LAB — LIPID PANEL
Chol/HDL Ratio: 3 ratio (ref 0.0–5.0)
Cholesterol, Total: 115 mg/dL (ref 100–199)
HDL: 38 mg/dL — ABNORMAL LOW (ref >39)
LDL Chol Calc (NIH): 57 mg/dL (ref 0–99)
Triglycerides: 109 mg/dL (ref 0–149)
VLDL Cholesterol Cal: 20 mg/dL (ref 5–40)

## 2021-10-02 LAB — ALT: ALT: 25 IU/L (ref 0–44)

## 2021-10-02 NOTE — Patient Instructions (Signed)
Medication Instructions:  Your physician recommends that you continue on your current medications as directed. Please refer to the Current Medication list given to you today.  *If you need a refill on your cardiac medications before your next appointment, please call your pharmacy*   Lab Work: LIPIDS, ALT, BMET Today If you have labs (blood work) drawn today and your tests are completely normal, you will receive your results only by: [place] (if you have MyChart) OR A paper copy in the mail If you have any lab test that is abnormal or we need to change your treatment, we will call you to review the results.   Testing/Procedures: NONE   Follow-Up: At [facility], you and your health needs are our priority.  As part of our continuing mission to provide you with exceptional heart care, we have created designated Provider Care Teams.  These Care Teams include your primary Cardiologist (physician) and Advanced Practice Providers (APPs -  Physician Assistants and Nurse Practitioners) who all work together to provide you with the care you need, when you need it.  We recommend signing up for the patient portal called "MyChart".  Sign up information is provided on this After Visit Summary.  MyChart is used to connect with patients for Virtual Visits (Telemedicine).  Patients are able to view lab/test results, encounter notes, upcoming appointments, etc.  Non-urgent messages can be sent to your provider as well.   To learn more about what you can do with MyChart, go to [email or web address].    Your next appointment:   1 year(s)  The format for your next appointment:   In Person  Provider:   Ronn Melena, [place] {  I   Important Information About Sugar

## 2021-11-13 ENCOUNTER — Other Ambulatory Visit: Payer: Self-pay

## 2021-11-13 MED ORDER — LOSARTAN POTASSIUM 50 MG PO TABS: ORAL_TABLET | ORAL | 3 refills | Status: DC

## 2021-11-13 MED ORDER — ROSUVASTATIN CALCIUM 10 MG PO TABS: ORAL_TABLET | ORAL | 3 refills | Status: DC

## 2021-11-13 NOTE — Addendum Note (Signed)
Addended by: Carter Kitten D on: 11/13/2021 10:02 AM   Modules accepted: Orders

## 2022-10-14 ENCOUNTER — Other Ambulatory Visit: Payer: Self-pay

## 2022-10-14 MED ORDER — LOSARTAN POTASSIUM 50 MG PO TABS: ORAL_TABLET | ORAL | 0 refills | Status: AC

## 2023-01-12 ENCOUNTER — Other Ambulatory Visit: Payer: Self-pay

## 2023-01-12 MED ORDER — ROSUVASTATIN CALCIUM 10 MG PO TABS: ORAL_TABLET | ORAL | 0 refills | Status: DC

## 2023-03-22 ENCOUNTER — Other Ambulatory Visit: Payer: Self-pay

## 2023-03-22 MED ORDER — ROSUVASTATIN CALCIUM 10 MG PO TABS: ORAL_TABLET | ORAL | 0 refills | Status: AC

## 2023-04-12 ENCOUNTER — Other Ambulatory Visit: Payer: Self-pay | Admitting: Cardiovascular Disease

## 2023-04-13 NOTE — Telephone Encounter (Signed)
Pt of Dr. Elease Hashimoto is passed his third attempt. Would Dr. Elease Hashimoto like to refill? Please advise
